# Patient Record
Sex: Male | Born: 1970
Health system: Southern US, Community
[De-identification: ages and names within clinical notes are randomized; demographics above are authoritative.]

## PROBLEM LIST (undated history)

## (undated) DIAGNOSIS — T7840XA Allergy, unspecified, initial encounter: Secondary | ICD-10-CM

## (undated) DIAGNOSIS — J45909 Unspecified asthma, uncomplicated: Secondary | ICD-10-CM

## (undated) DIAGNOSIS — R0789 Other chest pain: Secondary | ICD-10-CM

## (undated) DIAGNOSIS — I639 Cerebral infarction, unspecified: Secondary | ICD-10-CM

## (undated) DIAGNOSIS — G8929 Other chronic pain: Secondary | ICD-10-CM

## (undated) DIAGNOSIS — R0600 Dyspnea, unspecified: Secondary | ICD-10-CM

## (undated) DIAGNOSIS — G43909 Migraine, unspecified, not intractable, without status migrainosus: Secondary | ICD-10-CM

## (undated) DIAGNOSIS — G4700 Insomnia, unspecified: Secondary | ICD-10-CM

## (undated) DIAGNOSIS — I480 Paroxysmal atrial fibrillation: Secondary | ICD-10-CM

## (undated) DIAGNOSIS — F329 Major depressive disorder, single episode, unspecified: Secondary | ICD-10-CM

## (undated) DIAGNOSIS — M1990 Unspecified osteoarthritis, unspecified site: Secondary | ICD-10-CM

## (undated) DIAGNOSIS — F319 Bipolar disorder, unspecified: Secondary | ICD-10-CM

## (undated) DIAGNOSIS — F419 Anxiety disorder, unspecified: Secondary | ICD-10-CM

## (undated) DIAGNOSIS — Z8719 Personal history of other diseases of the digestive system: Secondary | ICD-10-CM

## (undated) DIAGNOSIS — R51 Headache: Secondary | ICD-10-CM

## (undated) DIAGNOSIS — M545 Low back pain, unspecified: Secondary | ICD-10-CM

## (undated) DIAGNOSIS — F32A Depression, unspecified: Secondary | ICD-10-CM

## (undated) DIAGNOSIS — K219 Gastro-esophageal reflux disease without esophagitis: Secondary | ICD-10-CM

## (undated) DIAGNOSIS — F5104 Psychophysiologic insomnia: Secondary | ICD-10-CM

## (undated) DIAGNOSIS — F1010 Alcohol abuse, uncomplicated: Secondary | ICD-10-CM

## (undated) DIAGNOSIS — G4730 Sleep apnea, unspecified: Secondary | ICD-10-CM

## (undated) DIAGNOSIS — I10 Essential (primary) hypertension: Secondary | ICD-10-CM

## (undated) DIAGNOSIS — Z8711 Personal history of peptic ulcer disease: Secondary | ICD-10-CM

## (undated) DIAGNOSIS — I499 Cardiac arrhythmia, unspecified: Secondary | ICD-10-CM

## (undated) DIAGNOSIS — F4310 Post-traumatic stress disorder, unspecified: Secondary | ICD-10-CM

## (undated) DIAGNOSIS — E785 Hyperlipidemia, unspecified: Secondary | ICD-10-CM

## (undated) HISTORY — DX: Other chest pain: R07.89

## (undated) HISTORY — DX: Sleep apnea, unspecified: G47.30

## (undated) HISTORY — PX: SHOULDER SURGERY: SHX246

## (undated) HISTORY — DX: Essential (primary) hypertension: I10

## (undated) HISTORY — DX: Gastro-esophageal reflux disease without esophagitis: K21.9

## (undated) HISTORY — DX: Hyperlipidemia, unspecified: E78.5

## (undated) HISTORY — DX: Allergy, unspecified, initial encounter: T78.40XA

## (undated) HISTORY — PX: EAR CANALOPLASTY: SHX1481

## (undated) HISTORY — DX: Cerebral infarction, unspecified: I63.9

---

## 1999-08-28 ENCOUNTER — Emergency Department (HOSPITAL_COMMUNITY): Admission: EM | Admit: 1999-08-28 | Discharge: 1999-08-29 | Payer: Self-pay | Admitting: Internal Medicine

## 1999-09-02 ENCOUNTER — Ambulatory Visit (HOSPITAL_COMMUNITY): Admission: RE | Admit: 1999-09-02 | Discharge: 1999-09-02 | Payer: Self-pay

## 1999-09-04 ENCOUNTER — Ambulatory Visit (HOSPITAL_COMMUNITY): Admission: RE | Admit: 1999-09-04 | Discharge: 1999-09-04 | Payer: Self-pay

## 1999-09-11 ENCOUNTER — Ambulatory Visit (HOSPITAL_COMMUNITY): Admission: RE | Admit: 1999-09-11 | Discharge: 1999-09-11 | Payer: Self-pay | Admitting: Emergency Medicine

## 1999-09-24 ENCOUNTER — Ambulatory Visit (HOSPITAL_COMMUNITY): Admission: RE | Admit: 1999-09-24 | Discharge: 1999-09-24 | Payer: Self-pay | Admitting: Internal Medicine

## 2003-02-11 ENCOUNTER — Emergency Department (HOSPITAL_COMMUNITY): Admission: EM | Admit: 2003-02-11 | Discharge: 2003-02-11 | Payer: Self-pay | Admitting: Emergency Medicine

## 2005-01-15 ENCOUNTER — Emergency Department (HOSPITAL_COMMUNITY): Admission: EM | Admit: 2005-01-15 | Discharge: 2005-01-15 | Payer: Self-pay | Admitting: Emergency Medicine

## 2005-11-17 ENCOUNTER — Emergency Department (HOSPITAL_COMMUNITY): Admission: EM | Admit: 2005-11-17 | Discharge: 2005-11-17 | Payer: Self-pay | Admitting: Emergency Medicine

## 2006-06-24 ENCOUNTER — Emergency Department (HOSPITAL_COMMUNITY): Admission: EM | Admit: 2006-06-24 | Discharge: 2006-06-24 | Payer: Self-pay | Admitting: Emergency Medicine

## 2006-08-15 ENCOUNTER — Emergency Department (HOSPITAL_COMMUNITY): Admission: EM | Admit: 2006-08-15 | Discharge: 2006-08-15 | Payer: Self-pay | Admitting: Emergency Medicine

## 2006-08-15 ENCOUNTER — Ambulatory Visit: Payer: Self-pay | Admitting: Vascular Surgery

## 2006-08-15 ENCOUNTER — Encounter (INDEPENDENT_AMBULATORY_CARE_PROVIDER_SITE_OTHER): Payer: Self-pay | Admitting: Emergency Medicine

## 2006-08-16 ENCOUNTER — Emergency Department (HOSPITAL_COMMUNITY): Admission: EM | Admit: 2006-08-16 | Discharge: 2006-08-17 | Payer: Self-pay | Admitting: Emergency Medicine

## 2007-06-02 ENCOUNTER — Emergency Department (HOSPITAL_COMMUNITY): Admission: EM | Admit: 2007-06-02 | Discharge: 2007-06-02 | Payer: Self-pay | Admitting: Emergency Medicine

## 2008-02-22 ENCOUNTER — Ambulatory Visit: Payer: Self-pay | Admitting: Cardiovascular Disease

## 2008-02-22 ENCOUNTER — Observation Stay (HOSPITAL_COMMUNITY): Admission: EM | Admit: 2008-02-22 | Discharge: 2008-02-24 | Payer: Self-pay | Admitting: Emergency Medicine

## 2008-02-23 ENCOUNTER — Encounter (INDEPENDENT_AMBULATORY_CARE_PROVIDER_SITE_OTHER): Payer: Self-pay | Admitting: Internal Medicine

## 2008-11-25 ENCOUNTER — Emergency Department (HOSPITAL_COMMUNITY): Admission: EM | Admit: 2008-11-25 | Discharge: 2008-11-25 | Payer: Self-pay | Admitting: Emergency Medicine

## 2009-01-03 ENCOUNTER — Emergency Department (HOSPITAL_COMMUNITY): Admission: EM | Admit: 2009-01-03 | Discharge: 2009-01-03 | Payer: Self-pay | Admitting: Emergency Medicine

## 2009-04-18 ENCOUNTER — Emergency Department (HOSPITAL_COMMUNITY): Admission: EM | Admit: 2009-04-18 | Discharge: 2009-04-19 | Payer: Self-pay | Admitting: Emergency Medicine

## 2010-05-22 LAB — URINALYSIS, ROUTINE W REFLEX MICROSCOPIC
Bilirubin Urine: NEGATIVE
Glucose, UA: NEGATIVE mg/dL
Hgb urine dipstick: NEGATIVE
Ketones, ur: NEGATIVE mg/dL
Nitrite: NEGATIVE
Protein, ur: NEGATIVE mg/dL
Specific Gravity, Urine: 1.019 (ref 1.005–1.030)
Urobilinogen, UA: 0.2 mg/dL (ref 0.0–1.0)
pH: 6.5 (ref 5.0–8.0)

## 2010-05-22 LAB — URINE MICROSCOPIC-ADD ON

## 2010-05-22 LAB — POCT I-STAT, CHEM 8
BUN: 9 mg/dL (ref 6–23)
Calcium, Ion: 1.23 mmol/L (ref 1.12–1.32)
Chloride: 103 mEq/L (ref 96–112)
Creatinine, Ser: 0.9 mg/dL (ref 0.4–1.5)
Glucose, Bld: 102 mg/dL — ABNORMAL HIGH (ref 70–99)
HCT: 45 % (ref 39.0–52.0)
Hemoglobin: 15.3 g/dL (ref 13.0–17.0)
Potassium: 4 mEq/L (ref 3.5–5.1)
Sodium: 141 mEq/L (ref 135–145)
TCO2: 24 mmol/L (ref 0–100)

## 2010-05-22 LAB — POCT CARDIAC MARKERS
CKMB, poc: <1 ng/mL — ABNORMAL LOW (ref 1.0–8.0)
Myoglobin, poc: 53 ng/mL (ref 12–200)
Troponin i, poc: <0.05 ng/mL (ref 0.00–0.09)

## 2010-06-03 LAB — COMPREHENSIVE METABOLIC PANEL
ALT: 29 U/L (ref 0–53)
AST: 17 U/L (ref 0–37)
Albumin: 4.1 g/dL (ref 3.5–5.2)
Alkaline Phosphatase: 71 U/L (ref 39–117)
BUN: 6 mg/dL (ref 6–23)
CO2: 27 mEq/L (ref 19–32)
Calcium: 9.2 mg/dL (ref 8.4–10.5)
Chloride: 106 mEq/L (ref 96–112)
Creatinine, Ser: 0.75 mg/dL (ref 0.4–1.5)
GFR calc Af Amer: >60 mL/min (ref >60)
GFR calc non Af Amer: >60 mL/min (ref >60)
Glucose, Bld: 96 mg/dL (ref 70–99)
Potassium: 3.8 mEq/L (ref 3.5–5.1)
Sodium: 139 mEq/L (ref 135–145)
Total Bilirubin: 0.9 mg/dL (ref 0.3–1.2)
Total Protein: 7.5 g/dL (ref 6.0–8.3)

## 2010-06-03 LAB — LIPID PANEL
Cholesterol: 146 mg/dL (ref 0–200)
HDL: 36 mg/dL — ABNORMAL LOW (ref >39)
LDL Cholesterol: 90 mg/dL (ref 0–99)
Total CHOL/HDL Ratio: 4.1 RATIO
Triglycerides: 98 mg/dL (ref <150)
VLDL: 20 mg/dL (ref 0–40)

## 2010-06-03 LAB — CARDIAC PANEL(CRET KIN+CKTOT+MB+TROPI)
CK, MB: 1 ng/mL (ref 0.3–4.0)
CK, MB: 1 ng/mL (ref 0.3–4.0)
CK, MB: 1.2 ng/mL (ref 0.3–4.0)
Relative Index: INVALID (ref 0.0–2.5)
Relative Index: INVALID (ref 0.0–2.5)
Relative Index: INVALID (ref 0.0–2.5)
Total CK: 57 U/L (ref 7–232)
Total CK: 61 U/L (ref 7–232)
Total CK: 61 U/L (ref 7–232)
Troponin I: 0.01 ng/mL (ref 0.00–0.06)
Troponin I: <0.01 ng/mL (ref 0.00–0.06)
Troponin I: <0.01 ng/mL (ref 0.00–0.06)

## 2010-06-03 LAB — CK TOTAL AND CKMB (NOT AT ARMC)
CK, MB: 1.2 ng/mL (ref 0.3–4.0)
Relative Index: INVALID (ref 0.0–2.5)
Total CK: 71 U/L (ref 7–232)

## 2010-06-03 LAB — CBC
HCT: 44.9 % (ref 39.0–52.0)
Hemoglobin: 14.9 g/dL (ref 13.0–17.0)
MCHC: 33.2 g/dL (ref 30.0–36.0)
MCV: 93 fL (ref 78.0–100.0)
Platelets: 211 10*3/uL (ref 150–400)
RBC: 4.83 MIL/uL (ref 4.22–5.81)
RDW: 13.9 % (ref 11.5–15.5)
WBC: 6 10*3/uL (ref 4.0–10.5)

## 2010-06-03 LAB — DIFFERENTIAL
Basophils Absolute: 0 10*3/uL (ref 0.0–0.1)
Basophils Relative: 0 % (ref 0–1)
Eosinophils Absolute: 0 10*3/uL (ref 0.0–0.7)
Eosinophils Relative: 1 % (ref 0–5)
Lymphocytes Relative: 22 % (ref 12–46)
Lymphs Abs: 1.3 10*3/uL (ref 0.7–4.0)
Monocytes Absolute: 0.4 10*3/uL (ref 0.1–1.0)
Monocytes Relative: 7 % (ref 3–12)
Neutro Abs: 4.3 10*3/uL (ref 1.7–7.7)
Neutrophils Relative %: 71 % (ref 43–77)

## 2010-06-03 LAB — POCT CARDIAC MARKERS
CKMB, poc: <1 ng/mL — ABNORMAL LOW (ref 1.0–8.0)
Myoglobin, poc: 35.5 ng/mL (ref 12–200)
Troponin i, poc: <0.05 ng/mL (ref 0.00–0.09)

## 2010-06-03 LAB — TSH: TSH: 0.751 u[IU]/mL (ref 0.350–4.500)

## 2010-06-03 LAB — TROPONIN I: Troponin I: <0.01 ng/mL (ref 0.00–0.06)
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
## 2010-07-02 NOTE — Discharge Summary (Signed)
NAMEROTH, RESS                 ACCOUNT NO.:  [id number]   MEDICAL RECORD NO.:  [id number]          PATIENT TYPE:  OBV   LOCATION:  4738                         FACILITY:  [facility]   PHYSICIAN:  Michelene Gardener, MD    DATE OF BIRTH:  May 07, 1970   DATE OF ADMISSION:  02/22/2008  DATE OF DISCHARGE:  02/24/2008                               DISCHARGE SUMMARY   DISCHARGE DIAGNOSES:  1. Chest pain related to stress and anxiety.  2. Bipolar disorder, the patient is not on medications.  3. Suicide ideation.  4. Tobacco abuse.  5. Elevated blood pressure that normalized and it was secondary to      stress.   DISCHARGE MEDICATIONS:  None.   PROCEDURES:  None.   RADIOLOGIC STUDIES:  1. Chest x-ray in February 22, 2008 showed no active disease.  2. CT angiography in February 22, 2008 showed no pulmonary embolism and      no other acute problem.  3. Echocardiogram in February 23, 2008 showed ejection fraction of 60%      with no left wall motion abnormalities.   CONSULTATIONS:  Psych consult by Dr. Antonietta Breach.   PROCEDURES:  None.   FOLLOW UP:  With his primary doctor within 1-2 weeks.   COURSE OF HOSPITALIZATION:  This is a 40 year old African American male  with history of bipolar disorder who presented to the hospital on  February 22, 2008 with chest pain.  The patient was admitted to the  hospital for further evaluation.  Telemetry was monitored and showed no  evidence of acute problem.  Three sets of troponin and cardiac enzymes  were done and they came to be negative.  Echocardiogram was done and  showed normal ejection fraction without evidence of acute ischemia.  His  pain is felt to be secondary to distress.  At the time of discharge, his  pain resolved and the patient does not need further workup at this time.  Psych was consulted during the hospitalization because the patient has  been feeling depressed and he was thinking about suicide.  Initially, he  was monitored with  one-to-one sitter.  Dr. Jeanie Sewer evaluated him in  the hospital.  At that time, he was feeling better and he denied  suicidal ideations.  The sitter was discontinued.  He was given an  option for optional admission to  Behavior Medicine since he is not committable.  The patient chose to go  home and he will follow with Psych as an outpatient.  He will be  discharged today in satisfactory condition.   TOTAL ASSESSMENT TIME:  40 minutes.      Michelene Gardener, MD  Electronically Signed     NAE/MEDQ  D:  02/24/2008  T:  02/24/2008  Job:  [id number]
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
## 2010-12-04 LAB — CBC
HCT: 46.7
MCHC: 34.2
MCV: 90.2
RBC: 5.18

## 2010-12-04 LAB — POCT CARDIAC MARKERS
Operator id: 277751
Troponin i, poc: <0.05

## 2010-12-04 LAB — I-STAT 8, (EC8 V) (CONVERTED LAB)
Acid-base deficit: 2
BUN: 10
Chloride: 106
pCO2, Ven: 39.6 — ABNORMAL LOW
pH, Ven: 7.37 — ABNORMAL HIGH

## 2010-12-04 LAB — APTT: aPTT: 27

## 2010-12-04 LAB — D-DIMER, QUANTITATIVE: D-Dimer, Quant: <0.22

## 2010-12-04 LAB — POCT I-STAT CREATININE
Creatinine, Ser: 0.9
Operator id: 277751

## 2010-12-04 LAB — DIFFERENTIAL
Eosinophils Absolute: 0
Eosinophils Relative: 1
Lymphs Abs: 1.1
Monocytes Absolute: 0.5

## 2010-12-04 LAB — CK: Total CK: 67

## 2011-09-18 HISTORY — PX: KNEE ARTHROSCOPY: SHX127

## 2012-06-11 ENCOUNTER — Emergency Department (HOSPITAL_COMMUNITY): Payer: Self-pay

## 2012-06-11 ENCOUNTER — Other Ambulatory Visit: Payer: Self-pay

## 2012-06-11 ENCOUNTER — Observation Stay (HOSPITAL_COMMUNITY)
Admission: EM | Admit: 2012-06-11 | Discharge: 2012-06-12 | Disposition: A | Discharge status: Home / Self Care | Payer: 59 | Attending: Internal Medicine | Admitting: Internal Medicine

## 2012-06-11 ENCOUNTER — Encounter (HOSPITAL_COMMUNITY): Payer: Self-pay | Admitting: Emergency Medicine

## 2012-06-11 DIAGNOSIS — Z23 Encounter for immunization: Secondary | ICD-10-CM | POA: Insufficient documentation

## 2012-06-11 DIAGNOSIS — F1010 Alcohol abuse, uncomplicated: Secondary | ICD-10-CM

## 2012-06-11 DIAGNOSIS — R000 Tachycardia, unspecified: Secondary | ICD-10-CM | POA: Insufficient documentation

## 2012-06-11 DIAGNOSIS — I4891 Unspecified atrial fibrillation: Secondary | ICD-10-CM

## 2012-06-11 DIAGNOSIS — R079 Chest pain, unspecified: Secondary | ICD-10-CM | POA: Insufficient documentation

## 2012-06-11 DIAGNOSIS — I10 Essential (primary) hypertension: Secondary | ICD-10-CM

## 2012-06-11 DIAGNOSIS — R0602 Shortness of breath: Secondary | ICD-10-CM | POA: Insufficient documentation

## 2012-06-11 LAB — CBC WITH DIFFERENTIAL/PLATELET
Hemoglobin: 16.4 g/dL (ref 13.0–17.0)
Lymphs Abs: 3.1 10*3/uL (ref 0.7–4.0)
Monocytes Relative: 7 % (ref 3–12)
Neutro Abs: 4.8 10*3/uL (ref 1.7–7.7)
Neutrophils Relative %: 56 % (ref 43–77)
RBC: 5.21 MIL/uL (ref 4.22–5.81)

## 2012-06-11 LAB — POCT I-STAT, CHEM 8
Calcium, Ion: 1.15 mmol/L (ref 1.12–1.23)
Chloride: 110 mEq/L (ref 96–112)
Glucose, Bld: 106 mg/dL — ABNORMAL HIGH (ref 70–99)
HCT: 48 % (ref 39.0–52.0)
Hemoglobin: 16.3 g/dL (ref 13.0–17.0)

## 2012-06-11 LAB — RAPID URINE DRUG SCREEN, HOSP PERFORMED
Amphetamines: NOT DETECTED
Cocaine: NOT DETECTED
Opiates: NOT DETECTED
Tetrahydrocannabinol: NOT DETECTED

## 2012-06-11 MED ORDER — ASPIRIN 81 MG PO CHEW
324.0000 mg | CHEWABLE_TABLET | Freq: Once | ORAL | Status: AC
  Administered 2012-06-11: 324 mg via ORAL
  Filled 2012-06-11: qty 4

## 2012-06-11 MED ORDER — DILTIAZEM HCL 100 MG IV SOLR
5.0000 mg/h | Freq: Once | INTRAVENOUS | Status: AC
  Administered 2012-06-11: 5 mg/h via INTRAVENOUS

## 2012-06-11 MED ORDER — METOPROLOL SUCCINATE ER 50 MG PO TB24: 50.0000 mg | ORAL_TABLET | Freq: Two times a day (BID) | ORAL | Status: DC

## 2012-06-11 MED ORDER — RIVAROXABAN 20 MG PO TABS
20.0000 mg | ORAL_TABLET | Freq: Every day | ORAL | Status: DC
  Administered 2012-06-11: 20 mg via ORAL
  Filled 2012-06-11 (×2): qty 1

## 2012-06-11 MED ORDER — DEXTROSE 5 % IV SOLN
5.0000 mg/h | Freq: Once | INTRAVENOUS | Status: AC
  Administered 2012-06-11: 5 mg/h via INTRAVENOUS

## 2012-06-11 MED ORDER — RIVAROXABAN 20 MG PO TABS: 20.0000 mg | ORAL_TABLET | Freq: Every day | ORAL | Status: DC

## 2012-06-11 MED ORDER — FLECAINIDE ACETATE 100 MG PO TABS
300.0000 mg | ORAL_TABLET | Freq: Once | ORAL | Status: AC
  Administered 2012-06-11: 300 mg via ORAL
  Filled 2012-06-11: qty 3

## 2012-06-11 MED ORDER — METOPROLOL TARTRATE 1 MG/ML IV SOLN
5.0000 mg | Freq: Once | INTRAVENOUS | Status: AC
  Administered 2012-06-11: 5 mg via INTRAVENOUS
  Filled 2012-06-11: qty 5

## 2012-06-11 MED ORDER — DILTIAZEM HCL 25 MG/5ML IV SOLN
20.0000 mg | Freq: Once | INTRAVENOUS | Status: AC
  Administered 2012-06-11: 20 mg via INTRAVENOUS

## 2012-06-11 NOTE — ED Provider Notes (Signed)
History     CSN: [id number]  Arrival date & time 06/11/12  1348   First MD Initiated Contact with Patient 06/11/12 1403      No chief complaint on file.   (Consider location/radiation/quality/duration/timing/severity/associated sxs/prior treatment) HPI Comments: 42 year old male with a history of hypertension for which he takes metoprolol presents with acute onset of chest pain which started one hour prior to arrival while he was sitting on the couch, is an uncomfortable feeling in his chest which he has had intermittently in the past though it comes on less than once a year. Nothing seems to make this better or worse, the patient was found to be very tachycardic in triage in an irregularly irregular rhythm. He admits to drinking alcohol on the weekends and states that he will often drink a 12 pack over 2 days, had 4 beers last night and denies any illegal substance use or other prescription drug use. He is on a weight loss program and taking a supplement with an unknown substance in the but does not take any over-the-counter medications for sinuses, coughing nor does he have any history of thyroid abnormalities. Prior to the symptoms today he was having a normal day with no complaints.  The history is provided by the patient and medical records.    Past Medical History  Diagnosis Date  . Hypertension     Past Surgical History  Procedure Laterality Date  . Knee surgery      No family history on file.  History  Substance Use Topics  . Smoking status: Former Smoker    Quit date: 01/03/2012  . Smokeless tobacco: Not on file  . Alcohol Use: Yes     Comment: occasionally      Review of Systems  All other systems reviewed and are negative.    Allergies  Depakote  Home Medications   Current Outpatient Rx  Name  Route  Sig  Dispense  Refill  . metoprolol succinate (TOPROL-XL) 50 MG 24 hr tablet   Oral   Take 50 mg by mouth daily. Take with or immediately following a  meal.         . Multiple Vitamins-Minerals (MENS ONE DAILY PO)   Oral   Take 1 tablet by mouth daily.           BP 125/78  Pulse 66  Temp(Src) 98.1 F (36.7 C)  Resp 15  SpO2 99%  Physical Exam  Nursing note and vitals reviewed. Constitutional: He appears well-developed and well-nourished. No distress.  HENT:  Head: Normocephalic and atraumatic.  Mouth/Throat: Oropharynx is clear and moist. No oropharyngeal exudate.  Eyes: Conjunctivae and EOM are normal. Pupils are equal, round, and reactive to light. Right eye exhibits no discharge. Left eye exhibits no discharge. No scleral icterus.  Neck: Normal range of motion. Neck supple. No JVD present. No thyromegaly present.  Cardiovascular: Normal heart sounds and intact distal pulses.  Exam reveals no gallop and no friction rub.   No murmur heard. Strong pulses at the radial arteries bilaterally, irregularly irregular tachycardic rhythm  Pulmonary/Chest: Effort normal and breath sounds normal. No respiratory distress. He has no wheezes. He has no rales.  Abdominal: Soft. Bowel sounds are normal. He exhibits no distension and no mass. There is no tenderness.  Musculoskeletal: Normal range of motion. He exhibits no edema and no tenderness.  Lymphadenopathy:    He has no cervical adenopathy.  Neurological: He is alert. Coordination normal.  Skin: Skin is warm and dry.  No rash noted. No erythema.  Psychiatric: He has a normal mood and affect. His behavior is normal.    ED Course  Procedures (including critical care time)  Labs Reviewed  CBC WITH DIFFERENTIAL - Abnormal; Notable for the following:    MCHC 36.6 (*)    All other components within normal limits  POCT I-STAT, CHEM 8 - Abnormal; Notable for the following:    Glucose, Bld 106 (*)    All other components within normal limits  ETHANOL  URINE RAPID DRUG SCREEN (HOSP PERFORMED)  TSH  POCT I-STAT TROPONIN I   Dg Chest Port 1 View  06/11/2012  *RADIOLOGY REPORT*   Clinical Data: Shortness of breath and chest pain.  PORTABLE CHEST - 1 VIEW  Comparison: 04/18/2009 chest radiograph  Findings: The cardiomediastinal silhouette is unremarkable. There is no evidence of focal airspace disease, pulmonary edema, suspicious pulmonary nodule/mass, pleural effusion, or pneumothorax. No acute bony abnormalities are identified.  IMPRESSION: No evidence of acute cardiopulmonary disease.   Original Report Authenticated By: Harmon Pier, M.D.      1. Atrial fibrillation with rapid ventricular response       MDM  The patient is in atrial fibrillation with rapid ventricular rate at pulse of between 140 and 160 beats per minute. There is nonspecific T wave changes but no signs of acute ischemia. The patient will require an evaluation for his atrial fibrillation as well as rate control at this time. He is already on a beta blocker the cells start with IV Lopressor, if that does not help we will try IV Cardizem.  ED ECG REPORT  I personally interpreted this EKG   Date: 06/11/2012   Rate: 149  Rhythm: atrial fibrillationwith rapid ventricular response  QRS Axis: normal  Intervals: normal  ST/T Wave abnormalities: nonspecific T wave changes  Conduction Disutrbances:none  Narrative Interpretation:   Old EKG Reviewed: Compared with 01/03/2009, normal sinus rhythm has been replaced with atrial fibrillation  The patient's testing shows that he has a normal metabolic panel, no renal dysfunction, normal blood counts, normal troponin.  Portable chest x-ray reveals no signs of abnormalities.  The patient is requiring Cardizem bolus and drip to help control heart rate for the persistent tachycardia. Currently on a drip of 5 mg an hour the patient's heart rate is around 100 beats per minute, still in atrial fibrillation, no conversion to normal sinus rhythm.  I discussed the patient's care with the cardiology team, they will come to admit to the hospital,  Critical care delivered  secondary to significant atrial fibrillation with rapid ventricular response requiring continuous IV infusion of medications for rate control.  CRITICAL CARE Performed by: Vida Roller   Total critical care time: 35  Critical care time was exclusive of separately billable procedures and treating other patients.  Critical care was necessary to treat or prevent imminent or life-threatening deterioration.  Critical care was time spent personally by me on the following activities: development of treatment plan with patient and/or surrogate as well as nursing, discussions with consultants, evaluation of patient's response to treatment, examination of patient, obtaining history from patient or surrogate, ordering and performing treatments and interventions, ordering and review of laboratory studies, ordering and review of radiographic studies, pulse oximetry and re-evaluation of patient's condition.        Vida Roller, MD 06/11/12 1524

## 2012-06-11 NOTE — Progress Notes (Signed)
  Echocardiogram 2D Echocardiogram has been performed.  Scott Walter 06/11/2012, 7:35 PM

## 2012-06-11 NOTE — H&P (Addendum)
Patient ID: Scott Walter MRN: [id number], DOB/AGE: 42/07/1970   Admit date: 06/11/2012 Date of Consult: @TODAY @  Primary Physician: No PCP Per Patient Primary Cardiologist: New    Problem List: Past Medical History  Diagnosis Date  . Hypertension     Past Surgical History  Procedure Laterality Date  . Knee surgery      FHX  NEg for afib or CAD Allergies:  Allergies  Allergen Reactions  . Depakote (Divalproex Sodium)    MEDS:  Toprol XL 50 mg qd ALkilete (supplement:  Ca, Mg, K)  HPI:  Patient is a 42 yo with a history of HTN who presents to the ER with complaints of CP  Pain began while sitting on sofa.  Felt uncomfortable  Has had rarely in past.  Came to ER  Found to be in afib with rapid response. The patieth admits to drinking 6beers per day over weekend  Had 4 beers last nigh.  Is taking some diet supplement. Sitting on sofa  Felt chest heaviness  Discomfort in L arm  Tingling  Associated with SOB  Felt heart racing like running marathon   Rare flutters in past.  Rare  [facility]  On Toprol XL 50  He felt fine yesterday  Fine this AM until this started.  No SOB NO CP    No family history on file.   History   Social History  . Marital Status: Single    Spouse Name: N/A    Number of Children: N/A  . Years of Education: N/A   Occupational History  . Not on file.   Social History Main Topics  . Smoking status: Former Smoker    Quit date: 01/03/2012  . Smokeless tobacco: Not on file  . Alcohol Use: Yes     Comment: occasionally  . Drug Use: Not on file  . Sexually Active: Not on file   Other Topics Concern  . Not on file   Social History Narrative  . No narrative on file     Review of Systems: All other systems reviewed and are otherwise negative except as noted above.  Physical Exam: Filed Vitals:   06/11/12 1656  BP: 114/74  Pulse: 97  Temp:   Resp: 14   No intake or output data in the 24 hours ending 06/11/12  1740  General: Well developed, well nourished, in no acute distress. Head: Normocephalic, atraumatic, sclera non-icteric Neck: Negative for carotid bruits. JVP not elevated. Lungs: Clear bilaterally to auscultation without wheezes, rales, or rhonchi. Breathing is unlabored. Heart: RRR with S1 S2. No murmurs, rubs, or gallops appreciated. Abdomen: Soft, non-tender, non-distended with normoactive bowel sounds. No hepatomegaly. No rebound/guarding. No obvious abdominal masses. Msk:  Strength and tone appears normal for age. Extremities: No clubbing, cyanosis or edema.  Distal pedal pulses are 2+ and equal bilaterally. Neuro: Alert and oriented X 3. Moves all extremities spontaneously. Psych:  Responds to questions appropriately with a normal affect.  Labs: Results for orders placed during the hospital encounter of 06/11/12 (from the past 24 hour(s))  CBC WITH DIFFERENTIAL     Status: Abnormal   Collection Time    06/11/12  2:18 PM      Result Value Range   WBC 8.7  4.0 - 10.5 K/uL   RBC 5.21  4.22 - 5.81 MIL/uL   Hemoglobin 16.4  13.0 - 17.0 g/dL   HCT 40.9  81.1 - 91.4 %   MCV 86.0  78.0 -  100.0 fL   MCH 31.5  26.0 - 34.0 pg   MCHC 36.6 (*) 30.0 - 36.0 g/dL   RDW 47.8  29.5 - 62.1 %   Platelets 217  150 - 400 K/uL   Neutrophils Relative 56  43 - 77 %   Neutro Abs 4.8  1.7 - 7.7 K/uL   Lymphocytes Relative 36  12 - 46 %   Lymphs Abs 3.1  0.7 - 4.0 K/uL   Monocytes Relative 7  3 - 12 %   Monocytes Absolute 0.6  0.1 - 1.0 K/uL   Eosinophils Relative 2  0 - 5 %   Eosinophils Absolute 0.1  0.0 - 0.7 K/uL   Basophils Relative 1  0 - 1 %   Basophils Absolute 0.0  0.0 - 0.1 K/uL  ETHANOL     Status: None   Collection Time    06/11/12  2:18 PM      Result Value Range   Alcohol, Ethyl (B) <11  0 - 11 mg/dL  POCT I-STAT TROPONIN I     Status: None   Collection Time    06/11/12  2:43 PM      Result Value Range   Troponin i, poc 0.00  0.00 - 0.08 ng/mL   Comment 3           POCT  I-STAT, CHEM 8     Status: Abnormal   Collection Time    06/11/12  2:44 PM      Result Value Range   Sodium 143  135 - 145 mEq/L   Potassium 4.1  3.5 - 5.1 mEq/L   Chloride 110  96 - 112 mEq/L   BUN 14  6 - 23 mg/dL   Creatinine, Ser 3.08  0.50 - 1.35 mg/dL   Glucose, Bld 657 (*) 70 - 99 mg/dL   Calcium, Ion 8.46  9.62 - 1.23 mmol/L   TCO2 22  0 - 100 mmol/L   Hemoglobin 16.3  13.0 - 17.0 g/dL   HCT 95.2  84.1 - 32.4 %    Radiology/Studies: Dg Chest Port 1 View  06/11/2012  *RADIOLOGY REPORT*  Clinical Data: Shortness of breath and chest pain.  PORTABLE CHEST - 1 VIEW  Comparison: 04/18/2009 chest radiograph  Findings: The cardiomediastinal silhouette is unremarkable. There is no evidence of focal airspace disease, pulmonary edema, suspicious pulmonary nodule/mass, pleural effusion, or pneumothorax. No acute bony abnormalities are identified.  IMPRESSION: No evidence of acute cardiopulmonary disease.   Original Report Authenticated By: Harmon Pier, M.D.     EKG:  Atrial fibrillation 149 bpm.  Nonspecific ST T wave changes.  ASSESSMENT AND PLAN:   Patient is a 42 yo who presents with chest pressure, palpitations, SOB Found to be in atrial fib with RVR Symptoms improved now that rate is slowed.   No CHF on exam Will get echo to evaluate LV   If normal LV function plan to cardiovert with 300 mg Flecanide.  Observe for 2 additonal hours after cardioversion. WIll begin Xarelto.   Check TSH  2.  HTN  WIll need to follow  If converts would continue toprol XL  Increase to BID  3.  EtOH.  Counselled on cutting back.     Signed, Dietrich Pates 06/11/2012, 5:40 PM  Addendum:  Echo shows normal LV and RV systolic function No valvular abnormalities Will use flecanide 300 mg IV x 1 and attempt cardioversion xarelto 20 mg started.  If converts observe for 2 hours after  conversion and d/c home with outpatient f/u in clinci I would recomm increasing Toprol XL 50 to bid  If remains in afib  would admit and get rate control overnight  Switch to po meds.

## 2012-06-11 NOTE — ED Notes (Signed)
Spoke with Cardiologist. Pt. Has not converted from A-fib. She will put Pt. In for admission.

## 2012-06-11 NOTE — ED Notes (Signed)
Cp/ sob started about 1 hour ago

## 2012-06-11 NOTE — Progress Notes (Signed)
Patient did not cardiovert with flecanide Will admit and switch to PO meds fro rate control Follow symptoms/ HR.  Consider d/c cardioversion on weekend or as outpatient. Continue Xarelto

## 2012-06-12 DIAGNOSIS — I10 Essential (primary) hypertension: Secondary | ICD-10-CM

## 2012-06-12 DIAGNOSIS — F1010 Alcohol abuse, uncomplicated: Secondary | ICD-10-CM

## 2012-06-12 LAB — BASIC METABOLIC PANEL
CO2: 26 mEq/L (ref 19–32)
Calcium: 8.9 mg/dL (ref 8.4–10.5)
Creatinine, Ser: 0.94 mg/dL (ref 0.50–1.35)
Glucose, Bld: 105 mg/dL — ABNORMAL HIGH (ref 70–99)
Potassium: 4.3 mEq/L (ref 3.5–5.1)
Sodium: 141 mEq/L (ref 135–145)

## 2012-06-12 LAB — LIPID PANEL
LDL Cholesterol: 85 mg/dL (ref 0–99)
Triglycerides: 193 mg/dL — ABNORMAL HIGH (ref <150)
VLDL: 39 mg/dL (ref 0–40)

## 2012-06-12 LAB — TROPONIN I: Troponin I: <0.3 ng/mL (ref <0.30)

## 2012-06-12 MED ORDER — ASPIRIN EC 81 MG PO TBEC: 81.0000 mg | DELAYED_RELEASE_TABLET | Freq: Every day | ORAL | Status: DC

## 2012-06-12 MED ORDER — ASPIRIN 300 MG RE SUPP
300.0000 mg | RECTAL | Status: AC
  Filled 2012-06-12: qty 1

## 2012-06-12 MED ORDER — PNEUMOCOCCAL VAC POLYVALENT 25 MCG/0.5ML IJ INJ
0.5000 mL | INJECTION | INTRAMUSCULAR | Status: AC
  Administered 2012-06-12: 0.5 mL via INTRAMUSCULAR
  Filled 2012-06-12: qty 0.5

## 2012-06-12 MED ORDER — RIVAROXABAN 20 MG PO TABS
20.0000 mg | ORAL_TABLET | Freq: Every day | ORAL | Status: DC
  Filled 2012-06-12: qty 1

## 2012-06-12 MED ORDER — METOPROLOL SUCCINATE ER 50 MG PO TB24
50.0000 mg | ORAL_TABLET | Freq: Two times a day (BID) | ORAL | Status: DC
  Administered 2012-06-12 (×2): 50 mg via ORAL
  Filled 2012-06-12 (×4): qty 1

## 2012-06-12 MED ORDER — ONDANSETRON HCL 4 MG/2ML IJ SOLN: 4.0000 mg | Freq: Four times a day (QID) | INTRAMUSCULAR | Status: DC | PRN

## 2012-06-12 MED ORDER — ASPIRIN 81 MG PO CHEW
324.0000 mg | CHEWABLE_TABLET | ORAL | Status: AC
  Administered 2012-06-12: 243 mg via ORAL
  Filled 2012-06-12: qty 3

## 2012-06-12 MED ORDER — ACETAMINOPHEN 325 MG PO TABS: 650.0000 mg | ORAL_TABLET | ORAL | Status: DC | PRN

## 2012-06-12 MED ORDER — RIVAROXABAN 20 MG PO TABS: 20.0000 mg | ORAL_TABLET | Freq: Every day | ORAL | Status: DC

## 2012-06-12 MED ORDER — PNEUMOCOCCAL VAC POLYVALENT 25 MCG/0.5ML IJ INJ: 0.5000 mL | INJECTION | INTRAMUSCULAR | Status: DC

## 2012-06-12 MED ORDER — DILTIAZEM HCL 100 MG IV SOLR
5.0000 mg/h | INTRAVENOUS | Status: DC
  Filled 2012-06-12: qty 100

## 2012-06-12 MED ORDER — NITROGLYCERIN 0.4 MG SL SUBL: 0.4000 mg | SUBLINGUAL_TABLET | SUBLINGUAL | Status: DC | PRN

## 2012-06-12 NOTE — Discharge Summary (Signed)
Discharge Summary   Patient ID: Scott Walter,  MRN: [id number], DOB/AGE: 1970/10/20 42 y.o.  Admit date: 06/11/2012 Discharge date: 06/12/2012  Primary Physician: No PCP Per Patient Primary Cardiologist: T. Saranya Harlin, MD  Discharge Diagnoses Principal Problem:   Atrial fibrillation with rapid ventricular response Active Problems:   ETOH abuse   HTN (hypertension)   Allergies Allergies  Allergen Reactions  . Depakote (Divalproex Sodium)     Diagnostic Studies/Procedures  PORTABLE CHEST X-RAY - 06/11/12  IMPRESSION:  No evidence of acute cardiopulmonary disease.   TRANSTHORACIC ECHOCARDIOGRAM - 06/11/12  The cavity size was normal. Abygail Galeno thickness was increased in a pattern of mild LVH. Systolic function was normal. The estimated ejection fraction was in the range of 60% to 65%. Transthoracic echocardiography. M-mode, complete 2D, spectral Doppler, and color Doppler. Blood pressure: 121/92. Patient status: Observation. Location: Emergency department.   History of Present Illness Scott Walter is a 42 y.o. male who was admitted to [facility] on 06/11/12 with the above problem list.   He has a prior history of hypertension. He presented to [facility] [place] yesterday complaining of chest pain in general comfortable feeling while sitting on the sofa. He described this as chest heaviness radiating to his left arm with associated tingling and shortness of breath. He he did endorse tachypalpitations. He reported drinking approximately 6 beers per day over the weekend. He didn't endorse the use of a dietary supplement as well.  In the ED, he was noted to be in atrial fibrillation with RVR, rate approximately 140 beats per minute. EKG and telemetry confirm this. Initial troponin I returned within normal limits. BMET and CBC were within normal limits. Chest x-ray as above revealed no evidence of acute cardiopulmonary disease. He was started on diltiazem drip with rate improvement. The patient  was observed overnight for further evaluation and management.  Hospital Course   He was started on Xarelto for anticoagulation. UDS unremarkable. TSH returned within normal limits. Lipid panel- LDL 85, HDL 41, TG 193, TC 165. A subsequent troponin I returned within normal limits. 2-D echocardiogram as above indicated mild LVH, EF 60-65% and was otherwise normal. He remained stable overnight and spontaneously converted to NSR this morning. He was evaluated by Dr. Daleen Squibb and felt to be stable for discharge. His outpatient Toprol XL regimen will be increased to twice a day dosing. Given CHADSVASc score of 1, and poor candidacy for long term anticoagulation due to EtOH abuse, Xarelto will not be continued long term. He has been advised to quit drinking alcohol. This information, including supplemental atrial fibrillation material, has been clearly outlined in the discharge AVS.   Discharge Vitals:  Blood pressure 120/70, pulse 75, temperature 98 F (36.7 C), resp. rate 18, height 5\' 9"  (1.753 m), weight 102.558 kg (226 lb 1.6 oz), SpO2 97.00%.   Labs: Recent Labs     06/11/12  1418  06/11/12  1444  WBC  8.7   --   HGB  16.4  16.3  HCT  44.8  48.0  MCV  86.0   --   PLT  217   --     Recent Labs Lab 06/11/12 1444 06/12/12 0455  NA 143 141  K 4.1 4.3  CL 110 107  CO2  --  26  BUN 14 15  CREATININE 0.90 0.94  CALCIUM  --  8.9  GLUCOSE 106* 105*   Recent Labs     06/12/12  0455  TROPONINI  <0.30   Recent  Labs     06/12/12  0455  CHOL  165  HDL  41  LDLCALC  85  TRIG  193*  CHOLHDL  4.0    Recent Labs  06/11/12 1547  TSH 0.420    Disposition:  Discharge Orders   Future Orders Complete By Expires     Diet - low sodium heart healthy  As directed     Increase activity slowly  As directed           Follow-up Information   Follow up with [facility]. (Office will call you with an appointment date & time. )    Contact information:   [address] [place] [address]       Discharge Medications:    Medication List    TAKE these medications       MENS ONE DAILY PO  Take 1 tablet by mouth daily.     metoprolol succinate 50 MG 24 hr tablet  Commonly known as:  TOPROL-XL  Take 1 tablet (50 mg total) by mouth 2 (two) times daily. Take with or immediately following a meal.     Rivaroxaban 20 MG Tabs  Commonly known as:  XARELTO  Take 1 tablet (20 mg total) by mouth daily with supper.       Outstanding Labs/Studies: None  Duration of Discharge Encounter: Greater than 30 minutes including physician time.  Signed, R. Hurman Horn, PA-C 06/12/2012, 12:25 PM    Jesse Sans. Daleen Squibb, MD, [facility] Pager:  [phone]

## 2012-06-12 NOTE — Progress Notes (Addendum)
   CARE MANAGEMENT NOTE 06/12/2012  Patient:  Scott Walter, Scott Walter   Account Number:  [id number]  Date Initiated:  06/12/2012  Documentation initiated by:  Magnus Ivan  Subjective/Objective Assessment:   Atrial fibrillation with rapid ventricular response.     Action/Plan:   Medication Assistance   Anticipated DC Date:  06/12/2012   Anticipated DC Plan:        DC Planning Services  CM consult  Medication Assistance      Choice offered to / List presented to:             Status of service:  Completed, signed off Medicare Important Message given?   (If response is "NO", the following Medicare IM given date fields will be blank) Date Medicare IM given:   Date Additional Medicare IM given:    Discharge Disposition:  HOME/SELF CARE  Per UR Regulation:    If discussed at Long Length of Stay Meetings, dates discussed:    Comments:  06/12/12 12:35pm Spouse went to [facility] and picked up 5 tablets of Xarelto for 63.00.  Patient will only have to take 5 tablets. Spoke to [employer] and they will follow-up with patient in office. Patient was signed up for Xarelto 10 day free trial card. Patient can call Xarelto patient assistance and receive information on 10 day free card. Magnus Ivan, RN, BSN Case Mgmt. Phone [phone]

## 2012-06-12 NOTE — Progress Notes (Signed)
Pt converted to NSR. EKG to confirm. MD made aware. Will continue to monitor.

## 2012-06-12 NOTE — Progress Notes (Addendum)
Patient ID: Scott Walter, male   DOB: [birth year], 42 y.o.   MRN: [id number]   Patient Name: Scott Walter Date of Encounter: 06/12/2012    SUBJECTIVE  Spontaneously converted to normal sinus rhythm. Feels back to baseline and wants to go home. Alcohol major contributor to his A. fib. Discussed at length.  CURRENT MEDS . [START ON 06/13/2012] aspirin EC  81 mg Oral Daily  . metoprolol succinate  50 mg Oral BID  . [START ON 06/13/2012] pneumococcal 23 valent vaccine  0.5 mL Intramuscular Tomorrow-1000  . rivaroxaban  20 mg Oral Q supper    OBJECTIVE  Filed Vitals:   06/11/12 2315 06/12/12 0007 06/12/12 0500 06/12/12 0850  BP: 128/80 142/88 115/78 120/70  Pulse: 105 87 70 75  Temp:  98.3 F (36.8 C) 98 F (36.7 C)   Resp: 18 18 18    Height: 5\' 9"  (1.753 m)     Weight: 226 lb 1.6 oz (102.558 kg)     SpO2: 99% 97% 97%     Intake/Output Summary (Last 24 hours) at 06/12/12 1020 Last data filed at 06/12/12 0900  Gross per 24 hour  Intake    360 ml  Output      0 ml  Net    360 ml   Filed Weights   06/11/12 2315  Weight: 226 lb 1.6 oz (102.558 kg)    PHYSICAL EXAM  General: Pleasant, NAD. Neuro: Alert and oriented X 3. Moves all extremities spontaneously. Psych: Normal affect. HEENT:  Normal  Neck: Supple without bruits or JVD. Lungs:  Resp regular and unlabored, CTA. Heart: RRR no s3, s4, or murmurs. Abdomen: Soft, non-tender, non-distended, BS + x 4.  Extremities: No clubbing, cyanosis or edema. DP/PT/Radials 2+ and equal bilaterally.  Accessory Clinical Findings  CBC  Recent Labs  06/11/12 1418 06/11/12 1444  WBC 8.7  --   NEUTROABS 4.8  --   HGB 16.4 16.3  HCT 44.8 48.0  MCV 86.0  --   PLT 217  --    Basic Metabolic Panel  Recent Labs  06/11/12 1444 06/12/12 0455  NA 143 141  K 4.1 4.3  CL 110 107  CO2  --  26  GLUCOSE 106* 105*  BUN 14 15  CREATININE 0.90 0.94  CALCIUM  --  8.9   Liver Function Tests No results found for this basename:  AST, ALT, ALKPHOS, BILITOT, PROT, ALBUMIN,  in the last 72 hours No results found for this basename: LIPASE, AMYLASE,  in the last 72 hours Cardiac Enzymes  Recent Labs  06/12/12 0455  TROPONINI <0.30   BNP No components found with this basename: POCBNP,  D-Dimer No results found for this basename: DDIMER,  in the last 72 hours Hemoglobin A1C No results found for this basename: HGBA1C,  in the last 72 hours Fasting Lipid Panel  Recent Labs  06/12/12 0455  CHOL 165  HDL 41  LDLCALC 85  TRIG 193*  CHOLHDL 4.0   Thyroid Function Tests  Recent Labs  06/11/12 1547  TSH 0.420    TELE  Normal sinus rhythm  ECG    Radiology/Studies  Dg Chest Port 1 View  06/11/2012  *RADIOLOGY REPORT*  Clinical Data: Shortness of breath and chest pain.  PORTABLE CHEST - 1 VIEW  Comparison: 04/18/2009 chest radiograph  Findings: The cardiomediastinal silhouette is unremarkable. There is no evidence of focal airspace disease, pulmonary edema, suspicious pulmonary nodule/mass, pleural effusion, or pneumothorax. No acute bony abnormalities are identified.  IMPRESSION:  No evidence of acute cardiopulmonary disease.   Original Report Authenticated By: Harmon Pier, M.D.     ASSESSMENT AND PLAN   Alcohol-induced paroxysmal A. fib. We'll discharge today on metoprolol extended release 50 mg twice a day. His wife is artery obtained his meds including Xarelto. I've advised him to continue that for 6 days which is a number of tablets she got. We'll schedule for followup in the office with Dr. Tenny Craw. Patient advised not to come to the ED unless very symptomatic. He will also discontinue or seriously reduce his alcohol consumption. I would not prescribe aspirin.  Signed, Valera Castle MD

## 2012-06-12 NOTE — Progress Notes (Signed)
Utilization review completed.  P.J. Amilio Zehnder,RN,BSN Case Manager [phone]  

## 2012-06-15 ENCOUNTER — Telehealth: Payer: Self-pay | Admitting: Internal Medicine

## 2012-06-15 NOTE — Telephone Encounter (Signed)
New problem    Per after hour voice mail  Call patient to make an appt f/u his discharged from the hosptial    1. Left message on home voice mail to contact office.

## 2012-07-02 ENCOUNTER — Encounter: Payer: Self-pay | Admitting: Internal Medicine

## 2012-07-02 ENCOUNTER — Ambulatory Visit (INDEPENDENT_AMBULATORY_CARE_PROVIDER_SITE_OTHER): Payer: Self-pay | Admitting: Internal Medicine

## 2012-07-02 VITALS — BP 118/83 | HR 64 | Ht 69.0 in | Wt 232.8 lb

## 2012-07-02 DIAGNOSIS — I10 Essential (primary) hypertension: Secondary | ICD-10-CM

## 2012-07-02 MED ORDER — METOPROLOL SUCCINATE ER 50 MG PO TB24: 50.0000 mg | ORAL_TABLET | Freq: Two times a day (BID) | ORAL | Status: DC

## 2012-07-02 MED ORDER — ASPIRIN EC 81 MG PO TBEC: 81.0000 mg | DELAYED_RELEASE_TABLET | Freq: Every day | ORAL | Status: DC

## 2012-07-02 NOTE — Progress Notes (Signed)
HPI Patient is a 42 yo who was admitted to [facility] a few wks ago.  He presented with afib with RVR.  He was given IV diltiazem and po flecanide.  He converted hours later to SR.  Echo showed normal LV function. Since d/c he has been feeling ok  A little sluggish but not dizzy. No palpitations.  No SOB  No CP Allergies  Allergen Reactions  . Depakote (Divalproex Sodium)     Current Outpatient Prescriptions  Medication Sig Dispense Refill  . metoprolol succinate (TOPROL-XL) 50 MG 24 hr tablet Take 1 tablet (50 mg total) by mouth 2 (two) times daily. Take with or immediately following a meal.  60 tablet  11  . Multiple Vitamins-Minerals (MENS ONE DAILY PO) Take 1 tablet by mouth daily.       No current facility-administered medications for this visit.    Past Medical History  Diagnosis Date  . Hypertension     Past Surgical History  Procedure Laterality Date  . Knee surgery      No family history on file.  History   Social History  . Marital Status: Single    Spouse Name: N/A    Number of Children: N/A  . Years of Education: N/A   Occupational History  . Not on file.   Social History Main Topics  . Smoking status: Former Smoker    Quit date: 01/03/2012  . Smokeless tobacco: Not on file  . Alcohol Use: Yes     Comment: occasionally  . Drug Use: Not on file  . Sexually Active: Not on file   Other Topics Concern  . Not on file   Social History Narrative  . No narrative on file    Review of Systems:  All systems reviewed.  They are negative to the above problem except as previously stated.  Vital Signs: BP 118/83  Pulse 64  Ht 5\' 9"  (1.753 m)  Wt 232 lb 12.8 oz (105.597 kg)  BMI 34.36 kg/m2  Physical Exam Patient is in NAD HEENT:  Normocephalic, atraumatic. EOMI, PERRLA.  Neck: JVP is normal.  No bruits.  Lungs: clear to auscultation. No rales no wheezes.  Heart: Regular rate and rhythm. Normal S1, S2. No S3.   No significant murmurs. PMI not  displaced.  Abdomen:  Supple, nontender. Normal bowel sounds. No masses. No hepatomegaly.  Extremities:   Good distal pulses throughout. No lower extremity edema.  Musculoskeletal :moving all extremities.  Neuro:   alert and oriented x3.  CN II-XII grossly intact.   Assessment and Plan:  1.  Atrial fibrillatoin.  It does not sound like the patient has had a recurrence  I would keep on ASA with EtOH use.  I would cut back on toprol xl to 75 mg total per day  He is feeling sluggish Cut back on caffeine and EtOH  Stay active F/U in fall  2.  HTN  BP is good  Follow.

## 2012-07-02 NOTE — Patient Instructions (Signed)
Start Aspirin 81mg  daily.  Your physician recommends that you schedule a follow-up appointment in: Early September 2014 with Dr. Tenny Craw.

## 2012-07-04 ENCOUNTER — Encounter: Payer: Self-pay | Admitting: Internal Medicine

## 2012-10-06 ENCOUNTER — Telehealth: Payer: Self-pay | Admitting: Nurse Practitioner

## 2012-10-06 NOTE — Telephone Encounter (Signed)
Pt called this evening stating that he has been experiencing chest pressure for about 30 mins now.  He denies palpitations but can't be sure that he's not in afib.  I recommended that he call 911 and plan to present to the [facility] ED for evaluation.  He verbalized understanding, but said he might prefer to have his wife drive him.  I explained why I thought it would be better to call 911 and he again verbalized understanding.

## 2012-10-28 ENCOUNTER — Ambulatory Visit (INDEPENDENT_AMBULATORY_CARE_PROVIDER_SITE_OTHER): Payer: Non-veteran care | Admitting: Internal Medicine

## 2012-10-28 ENCOUNTER — Encounter: Payer: Self-pay | Admitting: Internal Medicine

## 2012-10-28 VITALS — BP 122/80 | HR 63 | Ht 69.0 in | Wt 225.0 lb

## 2012-10-28 DIAGNOSIS — I4891 Unspecified atrial fibrillation: Secondary | ICD-10-CM

## 2012-10-28 MED ORDER — METOPROLOL SUCCINATE ER 50 MG PO TB24: 50.0000 mg | ORAL_TABLET | Freq: Every day | ORAL | Status: DC

## 2012-10-28 NOTE — Progress Notes (Signed)
HPI Patinet is a 42 yo with history of PAF.  I saw him in ER in APril. He was last in clnic in May.   SInce then he has had 1 other spell of afib  Called in  Told to go to ER  Did not  Spell lasted a few days. Denies SOB  No dizziness  Allergies  Allergen Reactions  . Depakote [Divalproex Sodium]     Current Outpatient Prescriptions  Medication Sig Dispense Refill  . aspirin EC 81 MG tablet Take 1 tablet (81 mg total) by mouth daily.  90 tablet  3  . metoprolol succinate (TOPROL-XL) 50 MG 24 hr tablet Take 75 mg by mouth daily. Take with or immediately following a meal.      . Multiple Vitamins-Minerals (MENS ONE DAILY PO) Take 1 tablet by mouth daily.       No current facility-administered medications for this visit.    Past Medical History  Diagnosis Date  . Hypertension   . Atrial fibrillation     Past Surgical History  Procedure Laterality Date  . Knee surgery      No family history on file.  History   Social History  . Marital Status: Single    Spouse Name: N/A    Number of Children: N/A  . Years of Education: N/A   Occupational History  . Not on file.   Social History Main Topics  . Smoking status: Former Smoker    Quit date: 01/03/2012  . Smokeless tobacco: Not on file  . Alcohol Use: Yes     Comment: occasionally  . Drug Use: Not on file  . Sexual Activity: Not on file   Other Topics Concern  . Not on file   Social History Narrative  . No narrative on file    Review of Systems:  All systems reviewed.  They are negative to the above problem except as previously stated.  Vital Signs: BP 122/80  Pulse 63  Ht 5\' 9"  (1.753 m)  Wt 225 lb (102.059 kg)  BMI 33.21 kg/m2  Physical Exam Patient is in NAD HEENT:  Normocephalic, atraumatic. EOMI, PERRLA.  Neck: JVP is normal.  No bruits.  Lungs: clear to auscultation. No rales no wheezes.  Heart: Regular rate and rhythm. Normal S1, S2. No S3.   No significant murmurs. PMI not displaced.  Abdomen:   Supple, nontender. Normal bowel sounds. No masses. No hepatomegaly.  Extremities:   Good distal pulses throughout. No lower extremity edema.  Musculoskeletal :moving all extremities.  Neuro:   alert and oriented x3.  CN II-XII grossly intact.   Assessment and Plan:  1.  Atrial fibrillation  1 recurrence in 4 mon  Lasted few days  Clinically in SR Would continue toprol  He still feels sluggish  WOuld back down to 1 qd. Follow  Watch caffeine and etoh which he is doing Patient works [place] shift  Only 1 1/2 hours sleep  Not optimal for

## 2012-10-28 NOTE — Patient Instructions (Addendum)
Decrease toprol to 50mg  daily  Your physician recommends that you schedule a follow-up appointment in: March 2015 with Dr. Tenny Craw

## 2012-11-11 ENCOUNTER — Telehealth: Payer: Self-pay | Admitting: *Deleted

## 2012-11-11 MED ORDER — FLECAINIDE ACETATE 150 MG PO TABS: ORAL_TABLET | ORAL | Status: DC

## 2012-11-11 NOTE — Telephone Encounter (Signed)
Discussed with EP If has recurrent afib could give Rx for 150 mg Flecanide. Take once as needed to break Continue Toprol. Call if doesn't work Spoke with pt, aware of dr Tenny Craw recommendations.

## 2013-01-26 ENCOUNTER — Emergency Department (HOSPITAL_COMMUNITY): Payer: Non-veteran care

## 2013-01-26 ENCOUNTER — Telehealth: Payer: Self-pay | Admitting: Internal Medicine

## 2013-01-26 ENCOUNTER — Observation Stay (HOSPITAL_COMMUNITY)
Admission: EM | Admit: 2013-01-26 | Discharge: 2013-01-27 | Disposition: A | Discharge status: Home / Self Care | Payer: Non-veteran care | Attending: Cardiology | Admitting: Cardiology

## 2013-01-26 ENCOUNTER — Encounter (HOSPITAL_COMMUNITY): Payer: Self-pay | Admitting: Emergency Medicine

## 2013-01-26 DIAGNOSIS — Z79899 Other long term (current) drug therapy: Secondary | ICD-10-CM | POA: Insufficient documentation

## 2013-01-26 DIAGNOSIS — R002 Palpitations: Secondary | ICD-10-CM | POA: Insufficient documentation

## 2013-01-26 DIAGNOSIS — Z87891 Personal history of nicotine dependence: Secondary | ICD-10-CM | POA: Diagnosis not present

## 2013-01-26 DIAGNOSIS — R079 Chest pain, unspecified: Secondary | ICD-10-CM

## 2013-01-26 DIAGNOSIS — R0789 Other chest pain: Principal | ICD-10-CM | POA: Insufficient documentation

## 2013-01-26 DIAGNOSIS — I4891 Unspecified atrial fibrillation: Secondary | ICD-10-CM | POA: Diagnosis not present

## 2013-01-26 DIAGNOSIS — R110 Nausea: Secondary | ICD-10-CM | POA: Insufficient documentation

## 2013-01-26 DIAGNOSIS — Z888 Allergy status to other drugs, medicaments and biological substances status: Secondary | ICD-10-CM | POA: Diagnosis not present

## 2013-01-26 DIAGNOSIS — R42 Dizziness and giddiness: Secondary | ICD-10-CM | POA: Diagnosis not present

## 2013-01-26 DIAGNOSIS — Z7982 Long term (current) use of aspirin: Secondary | ICD-10-CM | POA: Insufficient documentation

## 2013-01-26 DIAGNOSIS — I10 Essential (primary) hypertension: Secondary | ICD-10-CM | POA: Insufficient documentation

## 2013-01-26 DIAGNOSIS — Z23 Encounter for immunization: Secondary | ICD-10-CM | POA: Diagnosis not present

## 2013-01-26 DIAGNOSIS — R0602 Shortness of breath: Secondary | ICD-10-CM | POA: Diagnosis not present

## 2013-01-26 DIAGNOSIS — R072 Precordial pain: Secondary | ICD-10-CM | POA: Diagnosis present

## 2013-01-26 DIAGNOSIS — F1010 Alcohol abuse, uncomplicated: Secondary | ICD-10-CM | POA: Diagnosis present

## 2013-01-26 DIAGNOSIS — R739 Hyperglycemia, unspecified: Secondary | ICD-10-CM | POA: Diagnosis present

## 2013-01-26 HISTORY — DX: Psychophysiologic insomnia: F51.04

## 2013-01-26 HISTORY — DX: Personal history of other diseases of the digestive system: Z87.19

## 2013-01-26 HISTORY — DX: Migraine, unspecified, not intractable, without status migrainosus: G43.909

## 2013-01-26 HISTORY — DX: Major depressive disorder, single episode, unspecified: F32.9

## 2013-01-26 HISTORY — DX: Bipolar disorder, unspecified: F31.9

## 2013-01-26 HISTORY — DX: Headache: R51

## 2013-01-26 HISTORY — DX: Depression, unspecified: F32.A

## 2013-01-26 HISTORY — DX: Low back pain: M54.5

## 2013-01-26 HISTORY — DX: Post-traumatic stress disorder, unspecified: F43.10

## 2013-01-26 HISTORY — DX: Other chronic pain: G89.29

## 2013-01-26 HISTORY — DX: Anxiety disorder, unspecified: F41.9

## 2013-01-26 HISTORY — DX: Personal history of peptic ulcer disease: Z87.11

## 2013-01-26 HISTORY — DX: Alcohol abuse, uncomplicated: F10.10

## 2013-01-26 HISTORY — DX: Paroxysmal atrial fibrillation: I48.0

## 2013-01-26 HISTORY — DX: Low back pain, unspecified: M54.50

## 2013-01-26 LAB — BASIC METABOLIC PANEL
Calcium: 8.9 mg/dL (ref 8.4–10.5)
GFR calc Af Amer: >90 mL/min (ref >90)
GFR calc non Af Amer: >90 mL/min (ref >90)
Glucose, Bld: 171 mg/dL — ABNORMAL HIGH (ref 70–99)
Sodium: 138 mEq/L (ref 135–145)

## 2013-01-26 LAB — CBC
Hemoglobin: 15.1 g/dL (ref 13.0–17.0)
MCH: 31.3 pg (ref 26.0–34.0)
MCHC: 35.6 g/dL (ref 30.0–36.0)
Platelets: 201 10*3/uL (ref 150–400)
RDW: 14.4 % (ref 11.5–15.5)

## 2013-01-26 LAB — POCT I-STAT TROPONIN I

## 2013-01-26 MED ORDER — FOLIC ACID 1 MG PO TABS
1.0000 mg | ORAL_TABLET | Freq: Every day | ORAL | Status: DC
  Administered 2013-01-27: 1 mg via ORAL
  Filled 2013-01-26: qty 1

## 2013-01-26 MED ORDER — ASPIRIN EC 81 MG PO TBEC
81.0000 mg | DELAYED_RELEASE_TABLET | Freq: Every day | ORAL | Status: DC
  Administered 2013-01-27: 81 mg via ORAL
  Filled 2013-01-26: qty 1

## 2013-01-26 MED ORDER — LORAZEPAM 1 MG PO TABS
1.0000 mg | ORAL_TABLET | Freq: Four times a day (QID) | ORAL | Status: DC | PRN
  Administered 2013-01-27: 1 mg via ORAL
  Filled 2013-01-26: qty 1

## 2013-01-26 MED ORDER — OXYCODONE-ACETAMINOPHEN 5-325 MG PO TABS
1.0000 | ORAL_TABLET | Freq: Four times a day (QID) | ORAL | Status: DC | PRN
  Administered 2013-01-26 – 2013-01-27 (×2): 1 via ORAL
  Filled 2013-01-26 (×2): qty 1

## 2013-01-26 MED ORDER — THIAMINE HCL 100 MG/ML IJ SOLN
100.0000 mg | Freq: Every day | INTRAMUSCULAR | Status: DC
  Filled 2013-01-26: qty 1

## 2013-01-26 MED ORDER — ONDANSETRON HCL 4 MG/2ML IJ SOLN: 4.0000 mg | Freq: Four times a day (QID) | INTRAMUSCULAR | Status: DC | PRN

## 2013-01-26 MED ORDER — LORAZEPAM 1 MG PO TABS: 0.0000 mg | ORAL_TABLET | Freq: Four times a day (QID) | ORAL | Status: DC

## 2013-01-26 MED ORDER — SODIUM CHLORIDE 0.9 % IV SOLN: 250.0000 mL | INTRAVENOUS | Status: DC | PRN

## 2013-01-26 MED ORDER — ACETAMINOPHEN 325 MG PO TABS: 650.0000 mg | ORAL_TABLET | ORAL | Status: DC | PRN

## 2013-01-26 MED ORDER — ADULT MULTIVITAMIN W/MINERALS CH
1.0000 | ORAL_TABLET | Freq: Every day | ORAL | Status: DC
  Administered 2013-01-27: 1 via ORAL
  Filled 2013-01-26: qty 1

## 2013-01-26 MED ORDER — LORAZEPAM 1 MG PO TABS
0.0000 mg | ORAL_TABLET | Freq: Two times a day (BID) | ORAL | Status: DC
Start: 2013-01-28 — End: 2013-01-27

## 2013-01-26 MED ORDER — METOPROLOL SUCCINATE ER 50 MG PO TB24
50.0000 mg | ORAL_TABLET | Freq: Every day | ORAL | Status: DC
  Administered 2013-01-27: 50 mg via ORAL
  Filled 2013-01-26: qty 1

## 2013-01-26 MED ORDER — SODIUM CHLORIDE 0.9 % IJ SOLN
3.0000 mL | Freq: Two times a day (BID) | INTRAMUSCULAR | Status: DC
  Administered 2013-01-26: 3 mL via INTRAVENOUS

## 2013-01-26 MED ORDER — INFLUENZA VAC SPLIT QUAD 0.5 ML IM SUSP
0.5000 mL | INTRAMUSCULAR | Status: AC
  Administered 2013-01-27: 0.5 mL via INTRAMUSCULAR
  Filled 2013-01-26: qty 0.5

## 2013-01-26 MED ORDER — HEPARIN SODIUM (PORCINE) 5000 UNIT/ML IJ SOLN
5000.0000 [IU] | Freq: Three times a day (TID) | INTRAMUSCULAR | Status: DC
  Filled 2013-01-26 (×5): qty 1

## 2013-01-26 MED ORDER — SODIUM CHLORIDE 0.9 % IJ SOLN: 3.0000 mL | INTRAMUSCULAR | Status: DC | PRN

## 2013-01-26 MED ORDER — LORAZEPAM 2 MG/ML IJ SOLN: 1.0000 mg | Freq: Four times a day (QID) | INTRAMUSCULAR | Status: DC | PRN

## 2013-01-26 MED ORDER — NITROGLYCERIN 0.4 MG SL SUBL
0.4000 mg | SUBLINGUAL_TABLET | SUBLINGUAL | Status: DC | PRN
  Administered 2013-01-26: 0.4 mg via SUBLINGUAL

## 2013-01-26 MED ORDER — VITAMIN B-1 100 MG PO TABS
100.0000 mg | ORAL_TABLET | Freq: Every day | ORAL | Status: DC
  Administered 2013-01-27: 100 mg via ORAL
  Filled 2013-01-26: qty 1

## 2013-01-26 MED ORDER — NAPHAZOLINE HCL 0.1 % OP SOLN
1.0000 [drp] | Freq: Four times a day (QID) | OPHTHALMIC | Status: DC | PRN
  Filled 2013-01-26: qty 15

## 2013-01-26 MED ORDER — ASPIRIN 81 MG PO CHEW
324.0000 mg | CHEWABLE_TABLET | Freq: Once | ORAL | Status: AC
  Administered 2013-01-26: 324 mg via ORAL
  Filled 2013-01-26: qty 4

## 2013-01-26 NOTE — ED Notes (Signed)
The pt reports that he has seen a doctor and they are going to keep him.  He still has the same level of pain since he arrived

## 2013-01-26 NOTE — ED Notes (Signed)
After sl nitro that did not help his pain.  Headache just after the med other than that no change in his chest pain

## 2013-01-26 NOTE — ED Notes (Signed)
Pt c/o some fleeting chest pain.  The pain lasts only 3-4 seconds otherwise he is watching tv.  He reports that he still has not seen a doctor

## 2013-01-26 NOTE — ED Notes (Signed)
The pt just went

## 2013-01-26 NOTE — ED Notes (Signed)
The pt reports that he has been having mid-chest pain for one week with dizziness sob and nausea.  No sob at present no distress.   He came in today for worsening symptoms.  He takes aspirin every other day .  His last was yesterday.  nsr on the monitor. He reports a history of  High bp only

## 2013-01-26 NOTE — ED Notes (Signed)
The pt just returned from xray 

## 2013-01-26 NOTE — ED Notes (Addendum)
Pt states he began having CP 2 days ago that has progressively gotten worse.  Pt with hx of Afib.  Pt also c/o "fluttering" feeling in chest.  Pt states he has blurred vision with hx of same.

## 2013-01-26 NOTE — Telephone Encounter (Signed)
Pt's wife  Called to let Dr. Tenny Craw know that pt  is in A-fibrillation and he is on his way to the ER. Pt states Dr. Tenny Craw would like to know about when he was in A-Fib. Pt / wife are aware that this message will send to MD for reviewing.

## 2013-01-26 NOTE — ED Notes (Signed)
Pt watching tv still c/o pain sl nitro given

## 2013-01-26 NOTE — H&P (Signed)
History and Physical  Patient ID: Stanly Si MRN: [id number], DOB: Oct 07, 1970 Date of Encounter: 01/26/2013, 6:34 PM Primary Physician: No PCP Per Patient Primary Cardiologist: Tenny Craw  Chief Complaint: CP Reason for Admission: CP  HPI: Mr. Stohr is a 42 y/o M with history of PAF diagnosed 05/2012, bipolar disorder/depression, prior EtOH abuse, HTN who presented to [facility] with complaints of chest pain. He developed substernal chest pressure this morning that wrapped around his chest. It feels sharper on the edges. He was laying down when it started. He had intermittent episodes the last few days, typically lasting 10 seconds at a time. However, today's episode started around 10:30am and has been constant since then. His first episode of CP was about 4 years ago. The pain is not worse with movement. Coughing increases the pain and deep breathing feels he feels like he's pushing on something. He does report exertional dyspnea moreso recently. He denies any dyspnea at rest, LEE, or orthopnea. He took flecainide at home and it did not help so he came to the ER. He endorsed associated nausea, sweatiness, blurry vision. He denied any GI bleeding. He received 324mg  ASA in the ER. He initially denied any pain with palpation but on physical exam he is tender with palpation. Despite constant discomfort he has remained in NSR. Initial troponin negative. CBG 171 otherwise labs nonacute. He is slightly hypertensive in the ED, currently 154/103.  Past Medical History  Diagnosis Date  . Hypertension   . PAF (paroxysmal atrial fibrillation)     a. Dx 05/2012.  [name] ETOH abuse   . Bipolar disorder     h/o SI  . Depression      Most Recent Cardiac Studies: 2D Echo 05/2012 Left ventricle: The cavity size was normal. Wall thickness was increased in a pattern of mild LVH. Systolic function was normal. The estimated ejection fraction was in the range of 60% to 65%.     Surgical History:  Past  Surgical History  Procedure Laterality Date  . Knee surgery       Home Meds: Prior to Admission medications   Medication Sig Start Date End Date Taking? Authorizing Provider  aspirin EC 81 MG tablet Take 1 tablet (81 mg total) by mouth daily. 07/02/12  Yes Pricilla Riffle, MD  flecainide [facility]) 150 MG tablet One tablet as needed for atrial fib 11/11/12  Yes Pricilla Riffle, MD  metoprolol succinate (TOPROL-XL) 50 MG 24 hr tablet Take 1 tablet (50 mg total) by mouth daily. Take with or immediately following a meal. 10/28/12  Yes Pricilla Riffle, MD  Multiple Vitamins-Minerals (MENS ONE DAILY PO) Take 1 tablet by mouth daily.   Yes Historical Provider, MD  naphazoline-glycerin (CLEAR EYES) 0.012-0.2 % SOLN Place 1-2 drops into both eyes every 4 (four) hours as needed for irritation.   Yes Historical Provider, MD    Allergies:  Allergies  Allergen Reactions  . Depakote [Divalproex Sodium] Anaphylaxis and Shortness Of Breath    History   Social History  . Marital Status: Married    Spouse Name: N/A    Number of Children: N/A  . Years of Education: N/A   Occupational History  . Not on file.   Social History Main Topics  . Smoking status: Former Smoker    Quit date: 01/03/2012  . Smokeless tobacco: Not on file     Comment: Quit ~2013  . Alcohol Use: Yes     Comment: occasionally - mostly weekends  .  Drug Use: No  . Sexual Activity: Not on file   Other Topics Concern  . Not on file   Social History Narrative  . No narrative on file     Family History  Problem Relation Age of Onset  . CAD Mother   . Diabetes Mother     Review of Systems: General: negative for chills, fever Cardiovascular: see above Dermatological: negative for rash Respiratory: negative for cough or wheezing Urologic: negative for hematuria Abdominal: negative for diarrhea, bright red blood per rectum, melena, or hematemesis Neurologic: negative for visual syncope, or dizziness All other systems  reviewed and are otherwise negative except as noted above.  Labs:   Lab Results  Component Value Date   WBC 5.8 01/26/2013   HGB 15.1 01/26/2013   HCT 42.4 01/26/2013   MCV 87.8 01/26/2013   PLT 201 01/26/2013    Recent Labs Lab 01/26/13 1316  NA 138  K 3.5  CL 103  CO2 21  BUN 9  CREATININE 0.72  CALCIUM 8.9  GLUCOSE 171*   No results found for this basename: CKTOTAL, CKMB, TROPONINI,  in the last 72 hours Lab Results  Component Value Date   CHOL 165 06/12/2012   HDL 41 06/12/2012   LDLCALC 85 06/12/2012   TRIG 193* 06/12/2012   Lab Results  Component Value Date   DDIMER  Value: <0.22        AT THE INHOUSE ESTABLISHED CUTOFF VALUE OF 0.48 ug/mL FEU, THIS ASSAY HAS BEEN DOCUMENTED IN THE LITERATURE TO HAVE 08/16/2006    Radiology/Studies:  Dg Chest 2 View  01/26/2013   CLINICAL DATA:  Chest pain.  EXAM: CHEST  2 VIEW  COMPARISON:  06/11/2012 and 04/18/2009  FINDINGS: There is cardiomegaly. Pulmonary vascularity is normal and the lungs are clear. No osseous abnormality. No effusions.  IMPRESSION: Chronic cardiomegaly.  No acute abnormalities.   Electronically Signed   By: Geanie Cooley M.D.   On: 01/26/2013 15:38     EKG: NSR 76bpm TWI III, minimal voltage criteria for LVH, otherwise no acute changes  Physical Exam: Blood pressure 149/100, pulse 69, temperature 97.3 F (36.3 C), temperature source Oral, resp. rate 16, height 5\' 9"  (1.753 m), weight 230 lb (104.327 kg), SpO2 100.00%. General: Well developed, well nourished overweight M in no acute distress. Extensive tattoos noted. Head: Normocephalic, atraumatic, sclera non-icteric, no xanthomas, nares are without discharge.  Neck: Negative for carotid bruits. JVD not elevated. Lungs: Clear bilaterally to auscultation without wheezes, rales, or rhonchi. Breathing is unlabored. Heart: RRR with S1 S2. No murmurs, rubs, or gallops appreciated. Abdomen: Soft, non-tender, non-distended with normoactive bowel sounds. No  hepatomegaly. No rebound/guarding. No obvious abdominal masses. Msk:  Strength and tone appear normal for age. Extremities: No clubbing or cyanosis. No edema.  Distal pedal pulses are 2+ and equal bilaterally. Neuro: Alert and oriented X 3. No focal deficit. No facial asymmetry. Moves all extremities spontaneously. Psych:  Responds to questions appropriately with a normal affect.    ASSESSMENT AND PLAN:  1. Chest pain 2. PAF maintaining NSR 3. HTN 4. Hyperglycemia 5. Habitual EtOH use  Signed, Dayna Dunn PA-C 01/26/2013, 6:34 PM  As above, patient seen and examined. Briefly he is a 42 year old male with a past medical history of paroxysmal atrial fibrillation, hypertension, bipolar disorder with chest pain. Patient has had intermittent chest pain for approximately 4 years. The pain is substernal with radiation to the right breast. It is described as a tightness and occasional sharp  sensation. It can increase with inspiration and cough. It is not related to food. It is not exertional. He complains of associated dyspnea, nausea and diaphoresis. He developed recurrent pain at 10:30 AM today. The pain has been continuous. Initial enzymes negative. Electrocardiogram shows sinus rhythm with no significant ST changes. Symptoms are atypical and most likely musculoskeletal as they are reproduced with palpation. Plan cycle enzymes. If negative discharge tomorrow morning with outpatient functional study. Patient remains in sinus rhythm. Continue aspirin as his only embolic risk factor is hypertension. He has flecanide as needed. Continue beta blocker. His blood pressure is elevated. Monitor and add additional medications as needed. Olga Millers

## 2013-01-26 NOTE — ED Provider Notes (Signed)
CSN: [id number]     Arrival date & time 01/26/13  1259 History   First MD Initiated Contact with Patient 01/26/13 1508     Chief Complaint  Patient presents with  . Chest Pain   (Consider location/radiation/quality/duration/timing/severity/associated sxs/prior Treatment) HPI Comments: Patient is a 42 year old male with a past medical history of atrial fibrillation and hypertension who presents with chest pain since 10:30am today. Patient was laying down and had sudden onset of right chest pain that moved to his central chest that radiates to his back. The pain is a pressure sensation that is severe. The pain has been constant since the onset. He reports associated palpitations, nausea, SOB, and lightheadedness. He reports taking a Flecainide when the symptoms started, as directed by his Cardiologist, Dr. Tenny Craw, however, this did not improve his symptoms. No aggravating/alleviating factors. No other associated symptoms. Patient continues to have pain and other associated symptoms, although the palpitations have resolved. Patient thinks he may have been in atrial fibrillation for a brief period of time.    Past Medical History  Diagnosis Date  . Hypertension   . Atrial fibrillation    Past Surgical History  Procedure Laterality Date  . Knee surgery     No family history on file. History  Substance Use Topics  . Smoking status: Former Smoker    Quit date: 01/03/2012  . Smokeless tobacco: Not on file  . Alcohol Use: Yes     Comment: occasionally    Review of Systems  Constitutional: Negative for fever, chills and fatigue.  HENT: Negative for trouble swallowing.   Eyes: Negative for visual disturbance.  Respiratory: Positive for shortness of breath.   Cardiovascular: Positive for chest pain and palpitations.  Gastrointestinal: Positive for nausea. Negative for vomiting, abdominal pain and diarrhea.  Genitourinary: Negative for dysuria and difficulty urinating.  Musculoskeletal:  Negative for arthralgias and neck pain.  Skin: Negative for color change.  Neurological: Negative for dizziness and weakness.  Psychiatric/Behavioral: Negative for dysphoric mood.    Allergies  Depakote  Home Medications   Current Outpatient Rx  Name  Route  Sig  Dispense  Refill  . aspirin EC 81 MG tablet   Oral   Take 1 tablet (81 mg total) by mouth daily.   90 tablet   3   . flecainide (TAMBOCOR) 150 MG tablet      One tablet as needed for atrial fib   30 tablet   12   . metoprolol succinate (TOPROL-XL) 50 MG 24 hr tablet   Oral   Take 1 tablet (50 mg total) by mouth daily. Take with or immediately following a meal.   30 tablet   6   . Multiple Vitamins-Minerals (MENS ONE DAILY PO)   Oral   Take 1 tablet by mouth daily.         . naphazoline-glycerin (CLEAR EYES) 0.012-0.2 % SOLN   Both Eyes   Place 1-2 drops into both eyes every 4 (four) hours as needed for irritation.          BP 161/100  Temp(Src) 97.3 F (36.3 C) (Oral)  Resp 20  Ht 5\' 9"  (1.753 m)  Wt 230 lb (104.327 kg)  BMI 33.95 kg/m2  SpO2 100% Physical Exam  Nursing note and vitals reviewed. Constitutional: He is oriented to person, place, and time. He appears well-developed and well-nourished. No distress.  HENT:  Head: Normocephalic and atraumatic.  Eyes: Conjunctivae and EOM are normal.  Neck: Normal range of  motion.  Cardiovascular: Normal rate and regular rhythm.  Exam reveals no gallop and no friction rub.   No murmur heard. Pulmonary/Chest: Effort normal and breath sounds normal. He has no wheezes. He has no rales. He exhibits no tenderness.  Abdominal: Soft. He exhibits no distension. There is no tenderness. There is no rebound and no guarding.  Musculoskeletal: Normal range of motion.  Neurological: He is alert and oriented to person, place, and time. Coordination normal.  Speech is goal-oriented. Moves limbs without ataxia.   Skin: Skin is warm and dry.  Psychiatric: He has a  normal mood and affect. His behavior is normal.    ED Course  Procedures (including critical care time) Labs Review Labs Reviewed  BASIC METABOLIC PANEL - Abnormal; Notable for the following:    Glucose, Bld 171 (*)    All other components within normal limits  CBC  POCT I-STAT TROPONIN I  POCT I-STAT TROPONIN I   Imaging Review Dg Chest 2 View  01/26/2013   CLINICAL DATA:  Chest pain.  EXAM: CHEST  2 VIEW  COMPARISON:  06/11/2012 and 04/18/2009  FINDINGS: There is cardiomegaly. Pulmonary vascularity is normal and the lungs are clear. No osseous abnormality. No effusions.  IMPRESSION: Chronic cardiomegaly.  No acute abnormalities.   Electronically Signed   By: Geanie Cooley M.D.   On: 01/26/2013 15:38    EKG Interpretation   None       MDM   1. Chest pain   2. Chest pain at rest     3:49 PM Labs unremarkable for acute change. EKG shows normal sinus rhythm without acute changes. Initial troponin negative. Patient continues to have chest pain and will have ASA and nitro. Vitals stable and patient afebrile.   8:50 PM [facility] saw the patient and will admit for observation.    Emilia Beck, PA-C 01/26/13 2050

## 2013-01-26 NOTE — Telephone Encounter (Signed)
New Message   Pt is in AFIB// states that he now going to the ER// He requests a call back even though he is going to the ER//please assist

## 2013-01-27 ENCOUNTER — Other Ambulatory Visit: Payer: Self-pay | Admitting: Physician Assistant

## 2013-01-27 DIAGNOSIS — R072 Precordial pain: Secondary | ICD-10-CM

## 2013-01-27 LAB — BASIC METABOLIC PANEL
BUN: 11 mg/dL (ref 6–23)
Chloride: 104 mEq/L (ref 96–112)
GFR calc Af Amer: >90 mL/min (ref >90)
GFR calc non Af Amer: >90 mL/min (ref >90)
Glucose, Bld: 104 mg/dL — ABNORMAL HIGH (ref 70–99)
Potassium: 3.5 mEq/L (ref 3.5–5.1)
Sodium: 139 mEq/L (ref 135–145)

## 2013-01-27 LAB — HEPATIC FUNCTION PANEL
ALT: 31 U/L (ref 0–53)
AST: 17 U/L (ref 0–37)
Albumin: 3.7 g/dL (ref 3.5–5.2)
Alkaline Phosphatase: 76 U/L (ref 39–117)
Bilirubin, Direct: 0.1 mg/dL (ref 0.0–0.3)
Indirect Bilirubin: 0.5 mg/dL (ref 0.3–0.9)
Total Protein: 7.5 g/dL (ref 6.0–8.3)

## 2013-01-27 LAB — LIPID PANEL
Cholesterol: 174 mg/dL (ref 0–200)
Total CHOL/HDL Ratio: 3.6 RATIO
VLDL: 26 mg/dL (ref 0–40)

## 2013-01-27 LAB — TSH: TSH: 0.763 u[IU]/mL (ref 0.350–4.500)

## 2013-01-27 LAB — CBC
HCT: 43.1 % (ref 39.0–52.0)
Hemoglobin: 15.3 g/dL (ref 13.0–17.0)
MCH: 31.3 pg (ref 26.0–34.0)
MCHC: 35.5 g/dL (ref 30.0–36.0)
WBC: 6.8 10*3/uL (ref 4.0–10.5)

## 2013-01-27 LAB — TROPONIN I: Troponin I: <0.3 ng/mL (ref <0.30)

## 2013-01-27 LAB — HEMOGLOBIN A1C: Mean Plasma Glucose: 117 mg/dL — ABNORMAL HIGH (ref <117)

## 2013-01-27 MED ORDER — TRAMADOL HCL 50 MG PO TABS: 50.0000 mg | ORAL_TABLET | Freq: Four times a day (QID) | ORAL | Status: DC | PRN

## 2013-01-27 NOTE — Discharge Summary (Signed)
CARDIOLOGY DISCHARGE SUMMARY   Patient ID: Scott Walter MRN: [id number] DOB/AGE: [birth year] 42 y.o.  Admit date: 01/26/2013 Discharge date: 01/31/2013  PCP: [facility] Primary Cardiologist: PR  Primary Discharge Diagnosis:  Precordial pain    Secondary Discharge Diagnosis:    PAF (paroxysmal atrial fibrillation)   HTN (hypertension)   ETOH abuse   Hyperglycemia  Procedures: CXR  Hospital Course: Scott Walter is a 42 y.o. male with no history of CAD.  He is followed by Dr. Tenny Craw for PAF and is on flecainide PRN. He had prolonged chest pain and he came to the hospital. His chest wall was slightly tender to palpation. He was in sinus rhythm and his ECG had no acute ischemic changes. His initial troponin was negative. He was hyperglycemic and hypertensive, but labs were otherwise normal. He was admitted for further evaluation and treatment.  His cardiac enzymes remained negative for MI. Chest x-ray was performed and reviewed and showed no acute abnormality. His chest pain resolved. He was kept on telemetry and maintained sinus rhythm.  On 01/27/2013, he was seen by Dr. Patty Sermons. Dr. Patty Sermons evaluated Scott Walter and reviewed all data. He felt that no further inpatient workup was indicated and Scott Walter could be safely discharged home, to follow up as an outpatient with a stress test.  Labs:  Pending: Hemoglobin A1c  Lab Results  Component Value Date   WBC 6.8 01/27/2013   HGB 15.3 01/27/2013   HCT 43.1 01/27/2013   MCV 88.1 01/27/2013   PLT 193 01/27/2013     Recent Labs Lab 01/26/13 2330 01/27/13 0600  NA  --  139  K  --  3.5  CL  --  104  CO2  --  26  BUN  --  11  CREATININE  --  0.81  CALCIUM  --  8.7  PROT 7.5  --   BILITOT 0.6  --   ALKPHOS 76  --   ALT 31  --   AST 17  --   GLUCOSE  --  104*   Lab Results  Component Value Date   TROPONINI <0.30 01/27/2013   Lipid Panel     Component Value Date/Time   CHOL 174 01/27/2013 0600   TRIG 129  01/27/2013 0600   HDL 48 01/27/2013 0600   CHOLHDL 3.6 01/27/2013 0600   VLDL 26 01/27/2013 0600   LDLCALC 100* 01/27/2013 0600   Lab Results  Component Value Date   HGBA1C 5.7* 01/26/2013    Radiology: Dg Chest 2 View 01/26/2013   CLINICAL DATA:  Chest pain.  EXAM: CHEST  2 VIEW  COMPARISON:  06/11/2012 and 04/18/2009  FINDINGS: There is cardiomegaly. Pulmonary vascularity is normal and the lungs are clear. No osseous abnormality. No effusions.  IMPRESSION: Chronic cardiomegaly.  No acute abnormalities.   Electronically Signed   By: Geanie Cooley M.D.   On: 01/26/2013 15:38   EKG:  Vent. rate 76 BPM PR interval 142 ms QRS duration 86 ms QT/QTc 392/441 ms P-R-T axes 45 36 5   FOLLOW UP PLANS AND APPOINTMENTS Allergies  Allergen Reactions  . Depakote [Divalproex Sodium] Anaphylaxis and Shortness Of Breath  . Ambien [Zolpidem Tartrate] Other (See Comments)    "Nightmares and mood swings     Medication List         aspirin EC 81 MG tablet  Take 1 tablet (81 mg total) by mouth daily.     flecainide 150 MG tablet  Commonly known as:  TAMBOCOR  One tablet as needed for atrial fib     MENS ONE DAILY PO  Take 1 tablet by mouth daily.     metoprolol succinate 50 MG 24 hr tablet  Commonly known as:  TOPROL-XL  Take 1 tablet (50 mg total) by mouth daily. Take with or immediately following a meal.     naphazoline-glycerin 0.012-0.2 % Soln  Commonly known as:  CLEAR EYES  Place 1-2 drops into both eyes every 4 (four) hours as needed for irritation.     traMADol 50 MG tablet  Commonly known as:  ULTRAM  Take 1-2 tablets (50-100 mg total) by mouth every 6 (six) hours as needed for moderate pain.        Discharge Orders   Future Appointments Provider Department Dept Phone   02/16/2013 9:15 AM [facility] [place]) [facility] SITE 3 NUCLEAR MED [phone]   02/21/2013 11:45 AM Pricilla Riffle, MD [facility] [phone]   Future  Orders Complete By Expires   Diet - low sodium heart healthy  As directed    Increase activity slowly  As directed      Follow-up Information   Follow up with  [facility] On 02/16/2013. (Treadmill stress test at 9:15 am)    Contact information:   [address] [place] [address]       Follow up with Dietrich Pates, MD On 02/21/2013. (at 11:45 am)    Specialty:  Cardiology   Contact information:   [address] [place] [address] [phone]       BRING ALL MEDICATIONS WITH YOU TO FOLLOW UP APPOINTMENTS  Time spent with patient to include physician time: 34 min Signed: Theodore Demark, PA-C 01/31/2013, 8:02 AM Co-Sign MD

## 2013-01-27 NOTE — ED Provider Notes (Signed)
Medical screening examination/treatment/procedure(s) were performed by non-physician practitioner and as supervising physician I was immediately available for consultation/collaboration.  EKG Interpretation    Date/Time:  Wednesday January 26 2013 13:03:23 EST Ventricular Rate:  76 PR Interval:  142 QRS Duration: 86 QT Interval:  392 QTC Calculation: 441 R Axis:   36 Text Interpretation:  Normal sinus rhythm Normal ECG No significant change since last tracing Confirmed by DOCHERTY  MD, MEGAN ([id number]) on 01/27/2013 12:07:39 PM              Shanna Cisco, MD 01/27/13 1207

## 2013-01-27 NOTE — Progress Notes (Signed)
Patient Name: Scott Walter Date of Encounter: 01/27/2013     Active Problems:   PAF (paroxysmal atrial fibrillation)   HTN (hypertension)   ETOH abuse   Chest pain   Hyperglycemia    SUBJECTIVE  Still mild chest discomfort. Tender to touch over anterior chest.  CURRENT MEDS . aspirin EC  81 mg Oral Daily  . folic acid  1 mg Oral Daily  . heparin  5,000 Units Subcutaneous Q8H  . influenza vac split quadrivalent PF  0.5 mL Intramuscular Tomorrow-1000  . LORazepam  0-4 mg Oral Q6H   Followed by  . [START ON 01/28/2013] LORazepam  0-4 mg Oral Q12H  . metoprolol succinate  50 mg Oral Daily  . multivitamin with minerals  1 tablet Oral Daily  . sodium chloride  3 mL Intravenous Q12H  . thiamine  100 mg Oral Daily   Or  . thiamine  100 mg Intravenous Daily    OBJECTIVE  Filed Vitals:   01/26/13 1945 01/26/13 2115 01/26/13 2224 01/27/13 0439  BP: 130/61 149/94 152/92 143/87  Pulse: 63 76 67 80  Temp:   98.1 F (36.7 C) 97.6 F (36.4 C)  TempSrc:    Oral  Resp: 17 14 16 18   Height:   5\' 9"  (1.753 m)   Weight:   228 lb 1.6 oz (103.465 kg)   SpO2: 99% 98% 96% 97%   No intake or output data in the 24 hours ending 01/27/13 0846 Filed Weights   01/26/13 1303 01/26/13 2224  Weight: 230 lb (104.327 kg) 228 lb 1.6 oz (103.465 kg)    PHYSICAL EXAM  General: Pleasant, NAD. Neuro: Alert and oriented X 3. Moves all extremities spontaneously. Psych: Normal affect. HEENT:  Normal  Neck: Supple without bruits or JVD. Lungs:  Resp regular and unlabored, CTA. Heart: RRR no s3, s4, or murmurs. Tender to palpation.over anterior chest. Abdomen: Soft, non-tender, non-distended, BS + x 4.  Extremities: No clubbing, cyanosis or edema. DP/PT/Radials 2+ and equal bilaterally.  Accessory Clinical Findings  CBC  Recent Labs  01/26/13 1316 01/27/13 0600  WBC 5.8 6.8  HGB 15.1 15.3  HCT 42.4 43.1  MCV 87.8 88.1  PLT 201 193   Basic Metabolic Panel  Recent Labs   57/84/69 1316 01/27/13 0600  NA 138 139  K 3.5 3.5  CL 103 104  CO2 21 26  GLUCOSE 171* 104*  BUN 9 11  CREATININE 0.72 0.81  CALCIUM 8.9 8.7   Liver Function Tests  Recent Labs  01/26/13 2330  AST 17  ALT 31  ALKPHOS 76  BILITOT 0.6  PROT 7.5  ALBUMIN 3.7   No results found for this basename: LIPASE, AMYLASE,  in the last 72 hours Cardiac Enzymes  Recent Labs  01/26/13 2330 01/27/13 0600  TROPONINI <0.30 <0.30   BNP No components found with this basename: POCBNP,  D-Dimer No results found for this basename: DDIMER,  in the last 72 hours Hemoglobin A1C No results found for this basename: HGBA1C,  in the last 72 hours Fasting Lipid Panel  Recent Labs  01/27/13 0600  CHOL 174  HDL 48  LDLCALC 100*  TRIG 129  CHOLHDL 3.6   Thyroid Function Tests No results found for this basename: TSH, T4TOTAL, FREET3, T3FREE, THYROIDAB,  in the last 72 hours  TELE  NSR  ECG  No ischemic  Changes.  Radiology/Studies  Dg Chest 2 View  01/26/2013   CLINICAL DATA:  Chest pain.  EXAM: CHEST  2 VIEW  COMPARISON:  06/11/2012 and 04/18/2009  FINDINGS: There is cardiomegaly. Pulmonary vascularity is normal and the lungs are clear. No osseous abnormality. No effusions.  IMPRESSION: Chronic cardiomegaly.  No acute abnormalities.   Electronically Signed   By: Geanie Cooley M.D.   On: 01/26/2013 15:38    ASSESSMENT AND PLAN 1. Chest pain  2. PAF maintaining NSR  3. HTN  4. Hyperglycemia  5. Habitual EtOH use  Plan: Cardiac enzymes are normal.  Okay for discharge today and return to office for treadmill Myoview.  Signed, Cassell Clement  MD

## 2013-01-27 NOTE — Progress Notes (Signed)
UR completed 

## 2013-02-16 ENCOUNTER — Ambulatory Visit (HOSPITAL_COMMUNITY): Payer: Non-veteran care | Attending: Cardiology | Admitting: Radiology

## 2013-02-16 VITALS — BP 121/85 | Ht 69.0 in | Wt 226.0 lb

## 2013-02-16 DIAGNOSIS — R0989 Other specified symptoms and signs involving the circulatory and respiratory systems: Secondary | ICD-10-CM | POA: Insufficient documentation

## 2013-02-16 DIAGNOSIS — R072 Precordial pain: Secondary | ICD-10-CM

## 2013-02-16 DIAGNOSIS — R079 Chest pain, unspecified: Secondary | ICD-10-CM

## 2013-02-16 DIAGNOSIS — I10 Essential (primary) hypertension: Secondary | ICD-10-CM | POA: Insufficient documentation

## 2013-02-16 DIAGNOSIS — I4891 Unspecified atrial fibrillation: Secondary | ICD-10-CM | POA: Insufficient documentation

## 2013-02-16 DIAGNOSIS — R42 Dizziness and giddiness: Secondary | ICD-10-CM | POA: Insufficient documentation

## 2013-02-16 DIAGNOSIS — R0609 Other forms of dyspnea: Secondary | ICD-10-CM | POA: Insufficient documentation

## 2013-02-16 DIAGNOSIS — R0602 Shortness of breath: Secondary | ICD-10-CM

## 2013-02-16 DIAGNOSIS — R0789 Other chest pain: Secondary | ICD-10-CM | POA: Insufficient documentation

## 2013-02-16 DIAGNOSIS — Z87891 Personal history of nicotine dependence: Secondary | ICD-10-CM | POA: Insufficient documentation

## 2013-02-16 MED ORDER — TECHNETIUM TC 99M SESTAMIBI GENERIC - CARDIOLITE
33.0000 | Freq: Once | INTRAVENOUS | Status: AC | PRN
  Administered 2013-02-16: 33 via INTRAVENOUS

## 2013-02-16 MED ORDER — TECHNETIUM TC 99M SESTAMIBI GENERIC - CARDIOLITE
11.0000 | Freq: Once | INTRAVENOUS | Status: AC | PRN
  Administered 2013-02-16: 11 via INTRAVENOUS

## 2013-02-16 NOTE — Progress Notes (Signed)
[facility] SITE 3 NUCLEAR MED [address] [place], [place] [address] [phone]    Cardiology Nuclear Med Study  Scott Walter is a 42 y.o. male     MRN : [id number]     DOB: 1970-07-31  Procedure Date: 02/16/2013  Nuclear Med Background Indication for Stress Test:  Evaluation for Ischemia and [facility] 01/2013 for CP with neg. enzymes History:  hx Afib; Echo 2014-EF 60-65% Cardiac Risk Factors: History of Smoking and Hypertension  Symptoms:  Chest Pain, Dizziness, DOE and SOB   Nuclear Pre-Procedure Caffeine/Decaff Intake:  None NPO After: 7:00pm   Lungs:  clear O2 Sat: 96% on room air. IV 0.9% NS with Angio Cath:  22g  IV Site: R Hand  IV Started by:  Doyne Keel, CNMT  Chest Size (in):  44 Cup Size: n/a  Height: 5\' 9"  (1.753 m)  Weight:  226 lb (102.513 kg)  BMI:  Body mass index is 33.36 kg/(m^2). Tech Comments:  Held toprol 48 hours; held all am meds    Nuclear Med Study 1 or 2 day study: 1 day  Stress Test Type:  Stress  Reading MD: Marca Ancona, MD  Order Authorizing Provider:  Lovina Reach, MD  Resting Radionuclide: Technetium 15m Sestamibi  Resting Radionuclide Dose: 11.0 mCi   Stress Radionuclide:  Technetium 88m Sestamibi  Stress Radionuclide Dose: 33.0 mCi           Stress Protocol Rest HR: 60 Stress HR: 169  Rest BP: 121/85 Stress BP: 170/95  Exercise Time (min): 8:00 METS: 10.10   Predicted Max HR: 178 bpm % Max HR: 94.94 bpm Rate Pressure Product: 01027   Dose of Adenosine (mg):  n/a Dose of Lexiscan: n/a mg  Dose of Atropine (mg): n/a Dose of Dobutamine: n/a mcg/kg/min (at max HR)  Stress Test Technologist: Frederick Peers, EMT-P  Nuclear Technologist:  Domenic Polite, CNMT     Rest Procedure:  Myocardial perfusion imaging was performed at rest 45 minutes following the intravenous administration of Technetium 48m Sestamibi. Rest ECG: NSR - Normal EKG  Stress Procedure:  The patient exercised on the treadmill utilizing the Bruce  Protocol for 8:00 minutes. The patient stopped due to sob/knee pain and denied any chest pain.  Technetium 8m Sestamibi was injected at peak exercise and myocardial perfusion imaging was performed after a brief delay. Stress ECG: No significant change from baseline ECG  QPS Raw Data Images:  Normal; no motion artifact; normal heart/lung ratio. Stress Images:  Normal homogeneous uptake in all areas of the myocardium. Rest Images:  Normal homogeneous uptake in all areas of the myocardium. Subtraction (SDS):  There is no evidence of scar or ischemia. Transient Ischemic Dilatation (Normal <1.22):  0.91 Lung/Heart Ratio (Normal <0.45):  0.27  Quantitative Gated Spect Images QGS EDV:  113 ml QGS ESV:  52 ml  Impression Exercise Capacity:  Fair exercise capacity. BP Response:  Normal blood pressure response. Clinical Symptoms:  Dyspnea. ECG Impression:  No significant ST segment change suggestive of ischemia. Comparison with Prior Nuclear Study: No images to compare  Overall Impression:  Normal stress nuclear study.  LV Ejection Fraction: 55%.  LV Wall Motion:  NL LV Function; NL Wall Motion  Marca Ancona 02/16/2013

## 2013-02-21 ENCOUNTER — Encounter: Payer: Self-pay | Admitting: Internal Medicine

## 2013-02-21 ENCOUNTER — Ambulatory Visit (INDEPENDENT_AMBULATORY_CARE_PROVIDER_SITE_OTHER): Payer: Federal, State, Local not specified - PPO | Admitting: Internal Medicine

## 2013-02-21 VITALS — BP 149/94 | HR 73 | Ht 69.0 in | Wt 232.1 lb

## 2013-02-21 DIAGNOSIS — I4891 Unspecified atrial fibrillation: Secondary | ICD-10-CM

## 2013-02-21 DIAGNOSIS — R079 Chest pain, unspecified: Secondary | ICD-10-CM

## 2013-02-21 DIAGNOSIS — R0609 Other forms of dyspnea: Secondary | ICD-10-CM

## 2013-02-21 DIAGNOSIS — R0989 Other specified symptoms and signs involving the circulatory and respiratory systems: Secondary | ICD-10-CM

## 2013-02-21 DIAGNOSIS — R0600 Dyspnea, unspecified: Secondary | ICD-10-CM

## 2013-02-21 DIAGNOSIS — I10 Essential (primary) hypertension: Secondary | ICD-10-CM

## 2013-02-21 NOTE — Progress Notes (Signed)
HPI Patinet is a 43 yo with history of PAF.  I saw him in ER in APril. He was last in clnic in May.   The patient called in on 01/26/13 Felt heart racing  Took Flecanide  Developed chest pain.  Called in  Did not get call back before went to ER. In hosp r/o for MI  CP was felt to be atypical, prob musculoskeletal as worse with palpation. Note that he was in SR on arrival and had no afib He had a stress myoview last week  This was normal The patient denies further afib  Chest symptoms have improved  He does say he gives out easier than he did in past.  Used to run 5 miles per day several yeas ago  Weighed 200 lbs at time Worried about causng problems if he does too much  Allergies  Allergen Reactions  . Depakote [Divalproex Sodium] Anaphylaxis and Shortness Of Breath  . Ambien [Zolpidem Tartrate] Other (See Comments)    "Nightmares and mood swings    Current Outpatient Prescriptions  Medication Sig Dispense Refill  . aspirin EC 81 MG tablet Take 1 tablet (81 mg total) by mouth daily.  90 tablet  3  . flecainide (TAMBOCOR) 150 MG tablet One tablet as needed for atrial fib  30 tablet  12  . metoprolol succinate (TOPROL-XL) 50 MG 24 hr tablet Take 1 tablet (50 mg total) by mouth daily. Take with or immediately following a meal.  30 tablet  6  . Multiple Vitamins-Minerals (MENS ONE DAILY PO) Take 1 tablet by mouth daily.      . naphazoline-glycerin (CLEAR EYES) 0.012-0.2 % SOLN Place 1-2 drops into both eyes every 4 (four) hours as needed for irritation.      . traMADol (ULTRAM) 50 MG tablet Take 1-2 tablets (50-100 mg total) by mouth every 6 (six) hours as needed for moderate pain.  30 tablet  0   No current facility-administered medications for this visit.    Past Medical History  Diagnosis Date  . Hypertension   . PAF (paroxysmal atrial fibrillation)     a. Dx 05/2012.  [name] ETOH abuse   . Bipolar disorder     h/o SI  . Depression   . Heart murmur     "when I was a baby"  (01/26/2013)  . Anginal pain     "last couple days" (01/26/2013)  . Shortness of breath     "can come up at anytime lately" (01/26/2013)  . History of stomach ulcers ~ 2009  . UKGURKYH(062.3)     "monthly" (01/26/2013)  . Migraine     "maybe 2 in my lifetime" (01/26/2013)  . Chronic lower back pain   . Anxiety   . Chronic insomnia   . PTSD (post-traumatic stress disorder)     Past Surgical History  Procedure Laterality Date  . Knee arthroscopy Right 09/2011    Family History  Problem Relation Age of Onset  . CAD Mother   . Diabetes Mother     History   Social History  . Marital Status: Married    Spouse Name: N/A    Number of Children: N/A  . Years of Education: N/A   Occupational History  . Not on file.   Social History Main Topics  . Smoking status: Former Smoker -- 1.00 packs/day for 30 years    Types: Cigarettes    Quit date: 01/03/2012  . Smokeless tobacco: Former Systems developer    Types: Loss adjuster, chartered  Comment: 01/26/2013 "quit chewing > 20 yr ago"  . Alcohol Use: 14.4 oz/week    24 Cans of beer per week     Comment: 01/26/2013 "case of beer q weekend"  . Drug Use: No  . Sexual Activity: Not Currently   Other Topics Concern  . Not on file   Social History Narrative  . No narrative on file    Review of Systems:  All systems reviewed.  They are negative to the above problem except as previously stated.  Vital Signs: BP 149/94  Pulse 73  Ht 5\' 9"  (1.753 m)  Wt 232 lb 1.9 oz (105.289 kg)  BMI 34.26 kg/m2  Physical Exam Patient is in NAD HEENT:  Normocephalic, atraumatic. EOMI, PERRLA.  Neck: JVP is normal.  No bruits.  Lungs: clear to auscultation. No rales no wheezes.  Heart: Regular rate and rhythm. Normal S1, S2. No S3.   No significant murmurs. PMI not displaced.  Abdomen:  Supple, nontender. Normal bowel sounds. No masses. No hepatomegaly.  Extremities:   Good distal pulses throughout. No lower extremity edema.  Musculoskeletal :moving all extremities.   Neuro:   alert and oriented x3.  CN II-XII grossly intact.   Assessment and Plan:  1.  Atrial fibrillation  No documented spell in Dec  Keep on prn Flecanide  2.  CP  Atypical  Myoview normal  Tried to reassure patient  3.  Dyspnea.  Echo from April did not comment on diastolic dysfunction   There is normal systolic function.  Question if relatd to deconditioning and obesity  Rec diet. He eats a lot of carbs esp at night.  Should cut back  Even 10 t o15 lbs.    F/U in Aug.

## 2013-02-21 NOTE — Patient Instructions (Signed)
Your physician wants you to follow-up in: AUGUST 2015 You will receive a reminder letter in the mail two months in advance. If you don't receive a letter, please call our office to schedule the follow-up appointment.

## 2013-03-02 ENCOUNTER — Encounter: Payer: Self-pay | Admitting: Internal Medicine

## 2013-03-02 ENCOUNTER — Ambulatory Visit (INDEPENDENT_AMBULATORY_CARE_PROVIDER_SITE_OTHER): Payer: Federal, State, Local not specified - PPO | Admitting: Internal Medicine

## 2013-03-02 VITALS — BP 124/84 | HR 72 | Temp 98.2°F | Ht 69.5 in | Wt 228.0 lb

## 2013-03-02 DIAGNOSIS — R0602 Shortness of breath: Secondary | ICD-10-CM

## 2013-03-02 DIAGNOSIS — I480 Paroxysmal atrial fibrillation: Secondary | ICD-10-CM

## 2013-03-02 DIAGNOSIS — F319 Bipolar disorder, unspecified: Secondary | ICD-10-CM

## 2013-03-02 DIAGNOSIS — G4700 Insomnia, unspecified: Secondary | ICD-10-CM

## 2013-03-02 DIAGNOSIS — R7309 Other abnormal glucose: Secondary | ICD-10-CM

## 2013-03-02 DIAGNOSIS — F5104 Psychophysiologic insomnia: Secondary | ICD-10-CM | POA: Insufficient documentation

## 2013-03-02 DIAGNOSIS — R739 Hyperglycemia, unspecified: Secondary | ICD-10-CM

## 2013-03-02 DIAGNOSIS — I4891 Unspecified atrial fibrillation: Secondary | ICD-10-CM

## 2013-03-02 DIAGNOSIS — F1010 Alcohol abuse, uncomplicated: Secondary | ICD-10-CM

## 2013-03-02 MED ORDER — MIRTAZAPINE 15 MG PO TABS: 15.0000 mg | ORAL_TABLET | Freq: Every day | ORAL | Status: DC

## 2013-03-02 MED ORDER — ESZOPICLONE 2 MG PO TABS: 2.0000 mg | ORAL_TABLET | Freq: Every evening | ORAL | Status: DC | PRN

## 2013-03-02 NOTE — Assessment & Plan Note (Signed)
Patient understands to followup with psychiatrist regarding her sleep and ongoing intermittent alcohol binge drinking.  He has history of PTSD.

## 2013-03-02 NOTE — Assessment & Plan Note (Signed)
Patient history chronic insomnia. He has failed Depakote in the past. If caused shortness of breath. He has not seen his [place] psychiatrist in over a year. He plans to establish with a new psychiatrist at [facility] facility in [place], [place].  He may benefit from stabilizers which does limit. He has resisted trying lithium in the past.  Despite risk of weight gain and hyperglycemia with atypical antipsychotic agents. Patient may benefit from such agents as Abilify.

## 2013-03-02 NOTE — Assessment & Plan Note (Signed)
Patient reports very poor sleep quality. He is only able to sleep 2-3 hours at a time. This is despite taking trazodone 150 mg and clonazepam 0.5 mg twice a day as needed. Trial of Lunesta 2 mg at bedtime. He is unable to tolerate zolpidem.  Patient advised to on good sleep hygiene.  He will decrease his caffeine intake.

## 2013-03-02 NOTE — Progress Notes (Signed)
Subjective:    Patient ID: Scott Walter, male    DOB: 1970/05/30, 43 y.o.   MRN: [id number]  HPI  43 year old Hispanic male with history of paroxysmal atrial fibrillation, alcohol abuse, bipolar disorder to establish. Patient followed by Dr. [name] for paroxysmal A. Fib. He has been followed by primary care physician at [facility] near [place], [place].  Patient reports he is also followed by the [facility] psychiatrist. They have tried and failed multiple medications. He is allergic to Depakote. He struggles with chronic severe insomnia. He is unsure whether he has been on atypical antipsychotics in the past.  He is currently taking trazodone 150 mg at bedtime as well as clonazepam 0.5 mg twice a day as needed.  He has history of daily alcohol use in the past. He currently seems about a case of beer per week. He admits to occasionally binge drinking on the weekend.  He quit tobacco a year ago. He smoked on and off since age 50. He has 10 year pack history.  Patient plans to establish with a [facility] psychiatrist in [place], [place].  Paroxysmal atrial fibrillation-patient is currently in sinus rhythm. Patient reports he rarely uses flecanide for intermittent arrhythmia. He is maintained on metoprolol succinate 50 mg once daily.  Patient currently works as a Freight forwarder. He reports intermittent unexplained dyspnea with exertion/shortness of breath.  Chart review performed. He had CBC completed on 01/27/2013. His hemoglobin is 15.3.  Chest x-ray shows chronic cardiomegaly. No acute abnormalities. (01/26/2013)  Patient completed nuclear stress study on 02/21/2013. Impression-normal stress nuclear study. LV ejection fraction 55%. Normal wall motion. Normal LV function.  Echocardiogram 06/11/2012- Cavity size was normal. Wall thickness was increased in pattern of mild LVH. Systolic function was normal. Estimated ejection fraction was in the range of 60-65%. Review of Systems   Constitutional: Negative for activity  change, appetite change. Mild weight gain  Eyes: Negative for visual disturbance.  Respiratory: Negative for cough.  Unexplained shortness of breath   Cardiovascular: Negative for chest pain.  Genitourinary: Negative for difficulty urinating.  Neurological: Negative for headaches.  Gastrointestinal: Negative for abdominal pain, heartburn melena or hematochezia Psych: positive for chronic insomnia Endo:  No polyuria or polydypsia      Past Medical History  Diagnosis Date  . Hypertension   . PAF (paroxysmal atrial fibrillation)     a. Dx 05/2012.  [name] ETOH abuse   . Bipolar disorder     h/o SI  . Depression   . Heart murmur     "when I was a baby" (01/26/2013)  . Anginal pain     "last couple days" (01/26/2013)  . Shortness of breath     "can come up at anytime lately" (01/26/2013)  . History of stomach ulcers ~ 2009  . XTKWIOXB(353.2)     "monthly" (01/26/2013)  . Migraine     "maybe 2 in my lifetime" (01/26/2013)  . Chronic lower back pain   . Anxiety   . Chronic insomnia   . PTSD (post-traumatic stress disorder)     History   Social History  . Marital Status: Married    Spouse Name: N/A    Number of Children: N/A  . Years of Education: N/A   Occupational History  . Not on file.   Social History Main Topics  . Smoking status: Former Smoker -- 1.00 packs/day for 30 years    Types: Cigarettes    Quit date: 01/03/2012  . Smokeless tobacco: Former Systems developer    Types:  Chew     Comment: 01/26/2013 "quit chewing > 20 yr ago"  . Alcohol Use: 14.4 oz/week    24 Cans of beer per week     Comment: 01/26/2013 "case of beer q weekend"  . Drug Use: No  . Sexual Activity: Not Currently   Other Topics Concern  . Not on file   Social History Narrative  . No narrative on file    Past Surgical History  Procedure Laterality Date  . Knee arthroscopy Right 09/2011    Family History  Problem Relation Age of Onset  . CAD Mother   . Diabetes Mother     Allergies    Allergen Reactions  . Depakote [Divalproex Sodium] Anaphylaxis and Shortness Of Breath  . Ambien [Zolpidem Tartrate] Other (See Comments)    "Nightmares and mood swings    Current Outpatient Prescriptions on File Prior to Visit  Medication Sig Dispense Refill  . aspirin EC 81 MG tablet Take 1 tablet (81 mg total) by mouth daily.  90 tablet  3  . flecainide (TAMBOCOR) 150 MG tablet One tablet as needed for atrial fib  30 tablet  12  . metoprolol succinate (TOPROL-XL) 50 MG 24 hr tablet Take 1 tablet (50 mg total) by mouth daily. Take with or immediately following a meal.  30 tablet  6  . Multiple Vitamins-Minerals (MENS ONE DAILY PO) Take 1 tablet by mouth daily.      . naphazoline-glycerin (CLEAR EYES) 0.012-0.2 % SOLN Place 1-2 drops into both eyes every 4 (four) hours as needed for irritation.       No current facility-administered medications on file prior to visit.    BP 124/84  Pulse 72  Temp(Src) 98.2 F (36.8 C) (Oral)  Ht 5' 9.5" (1.765 m)  Wt 228 lb (103.42 kg)  BMI 33.20 kg/m2       Objective:   Physical Exam  Constitutional: He is oriented to person, place, and time. He appears well-developed and well-nourished. No distress.  HENT:  Head: Normocephalic and atraumatic.  Right Ear: External ear normal.  Left Ear: External ear normal.  Mouth/Throat: Oropharynx is clear and moist.  Eyes: Conjunctivae are normal. Pupils are equal, round, and reactive to light.  Neck: Neck supple.  Cardiovascular: Normal rate, regular rhythm and normal heart sounds.   No murmur heard. Pulmonary/Chest: Effort normal. He has no wheezes.  Abdominal: Soft. Bowel sounds are normal. There is no tenderness.  Musculoskeletal: Normal range of motion. He exhibits no edema.  Lymphadenopathy:    He has no cervical adenopathy.  Neurological: He is alert and oriented to person, place, and time. No cranial nerve deficit.  Skin: Skin is warm and dry.  Psychiatric: He has a normal mood and  affect. His behavior is normal.          Assessment & Plan:

## 2013-03-02 NOTE — Progress Notes (Signed)
Pre visit review using our clinic review tool, if applicable. No additional management support is needed unless otherwise documented below in the visit note. 

## 2013-03-02 NOTE — Assessment & Plan Note (Addendum)
43 year old Hispanic male with unexplained shortness of breath. He has history of paroxysmal A. Fib. However his systolic ejection fraction is within normal limits. He has history of asthma. Lung exam is unremarkable. Obtain pulmonary function test with diffusion capacity.

## 2013-03-02 NOTE — Patient Instructions (Signed)
Establish with psychiatrist at [facility].  Discuss starting mood stabilizer such as Lamictal.

## 2013-03-02 NOTE — Assessment & Plan Note (Signed)
Patient has multiple family members with type 2 diabetes. He has abdominal obesity. His previous A1c however is unremarkable. Patient understands he is at higher risk for developing type 2 diabetes. I recommended low carbohydrate diet.  He is also treated for bipolar disorder. He could not tolerate Depakote in the past. He understands atypical psychotic agents may his risk for weight gain and type 2 diabetes.

## 2013-03-30 ENCOUNTER — Ambulatory Visit (INDEPENDENT_AMBULATORY_CARE_PROVIDER_SITE_OTHER): Payer: Federal, State, Local not specified - PPO | Admitting: Family Medicine

## 2013-03-30 ENCOUNTER — Encounter: Payer: Self-pay | Admitting: Family Medicine

## 2013-03-30 VITALS — BP 126/90 | HR 75 | Wt 234.0 lb

## 2013-03-30 DIAGNOSIS — M545 Low back pain, unspecified: Secondary | ICD-10-CM

## 2013-03-30 MED ORDER — DICLOFENAC SODIUM 75 MG PO TBEC: 75.0000 mg | DELAYED_RELEASE_TABLET | Freq: Two times a day (BID) | ORAL | Status: DC

## 2013-03-30 MED ORDER — METHOCARBAMOL 500 MG PO TABS: 500.0000 mg | ORAL_TABLET | Freq: Three times a day (TID) | ORAL | Status: DC | PRN

## 2013-03-30 NOTE — Patient Instructions (Signed)
Lumbosacral Strain Lumbosacral strain is a strain of any of the parts that make up your lumbosacral vertebrae. Your lumbosacral vertebrae are the bones that make up the lower third of your backbone. Your lumbosacral vertebrae are held together by muscles and tough, fibrous tissue (ligaments).  CAUSES  A sudden blow to your back can cause lumbosacral strain. Also, anything that causes an excessive stretch of the muscles in the low back can cause this strain. This is typically seen when people exert themselves strenuously, fall, lift heavy objects, bend, or crouch repeatedly. RISK FACTORS  Physically demanding work.  Participation in pushing or pulling sports or sports that require sudden twist of the back (tennis, golf, baseball).  Weight lifting.  Excessive lower back curvature.  Forward-tilted pelvis.  Weak back or abdominal muscles or both.  Tight hamstrings. SIGNS AND SYMPTOMS  Lumbosacral strain may cause pain in the area of your injury or pain that moves (radiates) down your leg.  DIAGNOSIS Your health care provider can often diagnose lumbosacral strain through a physical exam. In some cases, you may need tests such as X-ray exams.  TREATMENT  Treatment for your lower back injury depends on many factors that your clinician will have to evaluate. However, most treatment will include the use of anti-inflammatory medicines. HOME CARE INSTRUCTIONS   Avoid hard physical activities (tennis, racquetball, waterskiing) if you are not in proper physical condition for it. This may aggravate or create problems.  If you have a back problem, avoid sports requiring sudden body movements. Swimming and walking are generally safer activities.  Maintain good posture.  Maintain a healthy weight.  For acute conditions, you may put ice on the injured area.  Put ice in a plastic bag.  Place a towel between your skin and the bag.  Leave the ice on for 20 minutes, 2 3 times a day.  When the  low back starts healing, stretching and strengthening exercises may be recommended. SEEK MEDICAL CARE IF:  Your back pain is getting worse.  You experience severe back pain not relieved with medicines. SEEK IMMEDIATE MEDICAL CARE IF:   You have numbness, tingling, weakness, or problems with the use of your arms or legs.  There is a change in bowel or bladder control.  You have increasing pain in any area of the body, including your belly (abdomen).  You notice shortness of breath, dizziness, or feel faint.  You feel sick to your stomach (nauseous), are throwing up (vomiting), or become sweaty.  You notice discoloration of your toes or legs, or your feet get very cold. MAKE SURE YOU:   Understand these instructions.  Will watch your condition.  Will get help right away if you are not doing well or get worse. Document Released: 11/13/2004 Document Revised: 11/24/2012 Document Reviewed: 09/22/2012 Exit[employer] Patient Information 2014 [employer].  

## 2013-03-30 NOTE — Progress Notes (Signed)
Pre visit review using our clinic review tool, if applicable. No additional management support is needed unless otherwise documented below in the visit note. 
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
## 2013-04-04 ENCOUNTER — Ambulatory Visit: Payer: Non-veteran care | Admitting: Internal Medicine

## 2013-04-07 ENCOUNTER — Ambulatory Visit (INDEPENDENT_AMBULATORY_CARE_PROVIDER_SITE_OTHER): Payer: Federal, State, Local not specified - PPO | Admitting: Internal Medicine

## 2013-04-07 DIAGNOSIS — R0602 Shortness of breath: Secondary | ICD-10-CM

## 2013-04-07 LAB — PULMONARY FUNCTION TEST
DL/VA % pred: 139 %
DL/VA: 6.4 ml/min/mmHg/L
DLCO UNC % PRED: 104 %
DLCO UNC: 32.23 ml/min/mmHg
FEF 25-75 Post: 5.66 L/sec
FEF 25-75 Pre: 4.54 L/sec
FEF2575-%Change-Post: 24 %
FEF2575-%PRED-PRE: 120 %
FEF2575-%Pred-Post: 150 %
FEV1-%Change-Post: 5 %
FEV1-%PRED-PRE: 82 %
FEV1-%Pred-Post: 86 %
FEV1-POST: 3.48 L
FEV1-PRE: 3.31 L
FEV1FVC-%CHANGE-POST: 1 %
FEV1FVC-%PRED-PRE: 110 %
FEV6-%CHANGE-POST: 3 %
FEV6-%Pred-Post: 79 %
FEV6-%Pred-Pre: 76 %
FEV6-Post: 3.91 L
FEV6-Pre: 3.79 L
FEV6FVC-%Change-Post: 0 %
FEV6FVC-%Pred-Post: 102 %
FEV6FVC-%Pred-Pre: 103 %
FVC-%Change-Post: 3 %
FVC-%PRED-POST: 77 %
FVC-%Pred-Pre: 74 %
FVC-Post: 3.91 L
FVC-Pre: 3.79 L
POST FEV1/FVC RATIO: 89 %
POST FEV6/FVC RATIO: 100 %
PRE FEV1/FVC RATIO: 87 %
Pre FEV6/FVC Ratio: 100 %
RV % pred: 58 %
RV: 1.07 L
TLC % PRED: 72 %
TLC: 4.89 L

## 2013-04-07 NOTE — Progress Notes (Signed)
PFT done today. 

## 2013-04-08 ENCOUNTER — Telehealth: Payer: Self-pay | Admitting: Internal Medicine

## 2013-04-08 NOTE — Telephone Encounter (Signed)
done

## 2013-04-08 NOTE — Telephone Encounter (Signed)
PFT was 04/07/13 please advise Dr. Annamaria Boots thanks

## 2013-04-11 ENCOUNTER — Ambulatory Visit (INDEPENDENT_AMBULATORY_CARE_PROVIDER_SITE_OTHER): Payer: Federal, State, Local not specified - PPO | Admitting: Internal Medicine

## 2013-04-11 ENCOUNTER — Encounter: Payer: Self-pay | Admitting: Internal Medicine

## 2013-04-11 VITALS — BP 124/82 | HR 84 | Temp 98.5°F | Ht 69.5 in | Wt 231.0 lb

## 2013-04-11 DIAGNOSIS — R0602 Shortness of breath: Secondary | ICD-10-CM

## 2013-04-11 NOTE — Progress Notes (Signed)
Subjective:    Patient ID: Mcihael Walter, male    DOB: Feb 11, 1971, 43 y.o.   MRN: [id number]  HPI  43 year old male previously with history of paroxysmal A. Fib, alcohol abuse and hypertension for followup regarding dyspnea. Patient completed pulmonary function testing. It was negative for airway obstruction. His diffusing capacity was normal. Patient has mild restriction.  His chest x-ray performed on 01/26/2013 showed chronic cardiomegaly but no other acute abnormalities.   Patient reports dyspnea when you walks at a brisk pace over 100 yards. He denies any chronic cough.  We reviewed his weight history. Patient reports he has gained approximately 40 pounds over last several years.    He has decreased his alcohol consumption.  Review of Systems Negative for chest pain, negative for cough.  No muscle weakness    Past Medical History  Diagnosis Date  . Hypertension   . PAF (paroxysmal atrial fibrillation)     a. Dx 05/2012.  [name] ETOH abuse   . Bipolar disorder     h/o SI  . Depression   . Heart murmur     "when I was a baby" (01/26/2013)  . Anginal pain     "last couple days" (01/26/2013)  . Shortness of breath     "can come up at anytime lately" (01/26/2013)  . History of stomach ulcers ~ 2009  . QIHKVQQV(956.3)     "monthly" (01/26/2013)  . Migraine     "maybe 2 in my lifetime" (01/26/2013)  . Chronic lower back pain   . Anxiety   . Chronic insomnia   . PTSD (post-traumatic stress disorder)     History   Social History  . Marital Status: Married    Spouse Name: N/A    Number of Children: N/A  . Years of Education: N/A   Occupational History  . Not on file.   Social History Main Topics  . Smoking status: Former Smoker -- 1.00 packs/day for 30 years    Types: Cigarettes    Quit date: 01/03/2012  . Smokeless tobacco: Former Systems developer    Types: Chew     Comment: 01/26/2013 "quit chewing > 20 yr ago"  . Alcohol Use: 14.4 oz/week    24 Cans of beer per week   Comment: 01/26/2013 "case of beer q weekend"  . Drug Use: No  . Sexual Activity: Not Currently   Other Topics Concern  . Not on file   Social History Narrative  . No narrative on file    Past Surgical History  Procedure Laterality Date  . Knee arthroscopy Right 09/2011    Family History  Problem Relation Age of Onset  . CAD Mother   . Diabetes Mother     Allergies  Allergen Reactions  . Depakote [Divalproex Sodium] Anaphylaxis and Shortness Of Breath  . Ambien [Zolpidem Tartrate] Other (See Comments)    "Nightmares and mood swings    Current Outpatient Prescriptions on File Prior to Visit  Medication Sig Dispense Refill  . aspirin EC 81 MG tablet Take 1 tablet (81 mg total) by mouth daily.  90 tablet  3  . Biotin 5000 MCG TABS Take 1 tablet by mouth daily.      . clonazePAM (KLONOPIN) 0.5 MG tablet Take 0.5 mg by mouth 2 (two) times daily as needed for anxiety.      . eszopiclone (LUNESTA) 2 MG TABS tablet Take 1 tablet (2 mg total) by mouth at bedtime as needed for sleep. Take immediately before bedtime  30 tablet  1  . flecainide (TAMBOCOR) 150 MG tablet One tablet as needed for atrial fib  30 tablet  12  . methocarbamol (ROBAXIN) 500 MG tablet Take 1 tablet (500 mg total) by mouth every 8 (eight) hours as needed for muscle spasms.  30 tablet  0  . metoprolol succinate (TOPROL-XL) 50 MG 24 hr tablet Take 1 tablet (50 mg total) by mouth daily. Take with or immediately following a meal.  30 tablet  6  . Multiple Vitamins-Minerals (MENS ONE DAILY PO) Take 1 tablet by mouth daily.      . naphazoline-glycerin (CLEAR EYES) 0.012-0.2 % SOLN Place 1-2 drops into both eyes every 4 (four) hours as needed for irritation.       No current facility-administered medications on file prior to visit.    BP 124/82  Pulse 84  Temp(Src) 98.5 F (36.9 C) (Oral)  Ht 5' 9.5" (1.765 m)  Wt 231 lb (104.781 kg)  BMI 33.64 kg/m2    Objective:   Physical Exam  Constitutional: He is  oriented to person, place, and time. He appears well-developed and well-nourished. No distress.  HENT:  Head: Normocephalic and atraumatic.  Right Ear: External ear normal.  Left Ear: External ear normal.  Cardiovascular: Normal rate, regular rhythm and normal heart sounds.   No murmur heard. Pulmonary/Chest: Effort normal and breath sounds normal. He has no wheezes.  Neurological: He is alert and oriented to person, place, and time. No cranial nerve deficit.          Assessment & Plan:

## 2013-04-11 NOTE — Progress Notes (Signed)
Pre visit review using our clinic review tool, if applicable. No additional management support is needed unless otherwise documented below in the visit note. 

## 2013-04-11 NOTE — Assessment & Plan Note (Signed)
Pulmonary function testing was negative for airway obstruction. Patient has mild restriction. His history and chest x-ray is not consistent with interstitial lung disease or sarcoidosis.  Patient agrees to work toward 10-20 pound weight loss. Reevaluate in 4 months. If persistent dyspnea, consider CT of chest and or Functional exercise testing.

## 2013-04-11 NOTE — Patient Instructions (Signed)
Try to lose 10-20 lbs before your next follow up appointment Avoid juices, soft drinks and concentrated sweets. Start low intensity exercise program

## 2013-06-20 ENCOUNTER — Other Ambulatory Visit: Payer: Self-pay | Admitting: *Deleted

## 2013-06-20 DIAGNOSIS — I4891 Unspecified atrial fibrillation: Secondary | ICD-10-CM

## 2013-06-20 MED ORDER — METOPROLOL SUCCINATE ER 50 MG PO TB24: 50.0000 mg | ORAL_TABLET | Freq: Every day | ORAL | Status: DC

## 2013-08-10 ENCOUNTER — Ambulatory Visit: Payer: Federal, State, Local not specified - PPO | Admitting: Internal Medicine

## 2013-08-10 DIAGNOSIS — Z0289 Encounter for other administrative examinations: Secondary | ICD-10-CM

## 2013-08-24 ENCOUNTER — Other Ambulatory Visit: Payer: Self-pay

## 2013-08-24 MED ORDER — METOPROLOL SUCCINATE ER 50 MG PO TB24: 50.0000 mg | ORAL_TABLET | Freq: Every day | ORAL | Status: DC

## 2013-10-09 NOTE — Progress Notes (Signed)
HPI Patinet is a 43 yo with history of PAF.   And in clinic in Jan 2015 Since seen he has had short lived spells of palpitations.  Longest was 5 min  He did take flecanide for this Does get SOB with activity  Even working fork lift gets winded  (had neg GXT myoview last year)   Tried joining gym  SOB with activity.  Has not gone much  Cut back on ETOH  Now has had a case over 2 months.    Allergies  Allergen Reactions  . Depakote [Divalproex Sodium] Anaphylaxis and Shortness Of Breath  . Ambien [Zolpidem Tartrate] Other (See Comments)    "Nightmares and mood swings    Current Outpatient Prescriptions  Medication Sig Dispense Refill  . aspirin EC 81 MG tablet Take 1 tablet (81 mg total) by mouth daily.  90 tablet  3  . Biotin 5000 MCG TABS Take 1 tablet by mouth daily.      . clonazePAM (KLONOPIN) 0.5 MG tablet Take 0.5 mg by mouth 2 (two) times daily as needed for anxiety.      . cyclobenzaprine (FLEXERIL) 10 MG tablet Take 10 mg by mouth.      . diclofenac (VOLTAREN) 75 MG EC tablet Take 75 mg by mouth.      . flecainide (TAMBOCOR) 150 MG tablet One tablet as needed for atrial fib  30 tablet  12  . HYDROcodone-acetaminophen (NORCO/VICODIN) 5-325 MG per tablet Take 1 tablet by mouth 2 (two) times daily as needed.      . methocarbamol (ROBAXIN) 500 MG tablet Take 1 tablet (500 mg total) by mouth every 8 (eight) hours as needed for muscle spasms.  30 tablet  0  . metoprolol succinate (TOPROL-XL) 50 MG 24 hr tablet Take 1 tablet (50 mg total) by mouth daily. Take with or immediately following a meal.  30 tablet  1  . Multiple Vitamins-Minerals (MENS ONE DAILY PO) Take 1 tablet by mouth daily.      . naphazoline-glycerin (CLEAR EYES) 0.012-0.2 % SOLN Place 1-2 drops into both eyes every 4 (four) hours as needed for irritation.      . PredniSONE 10 MG KIT sterapred DS x6 day as directed. Reduce by 1 tablet daily until gone.      . Aspirin Effervescent 500 MG TBEF Take by mouth.      .  eszopiclone (LUNESTA) 1 MG TABS tablet Take 1 mg by mouth.       No current facility-administered medications for this visit.    Past Medical History  Diagnosis Date  . Hypertension   . PAF (paroxysmal atrial fibrillation)     a. Dx 05/2012.  [name] ETOH abuse   . Bipolar disorder     h/o SI  . Depression   . Heart murmur     "when I was a baby" (01/26/2013)  . Anginal pain     "last couple days" (01/26/2013)  . Shortness of breath     "can come up at anytime lately" (01/26/2013)  . History of stomach ulcers ~ 2009  . UTMLYYTK(354.6)     "monthly" (01/26/2013)  . Migraine     "maybe 2 in my lifetime" (01/26/2013)  . Chronic lower back pain   . Anxiety   . Chronic insomnia   . PTSD (post-traumatic stress disorder)     Past Surgical History  Procedure Laterality Date  . Knee arthroscopy Right 09/2011    Family History  Problem Relation Age of  Onset  . CAD Mother   . Diabetes Mother     History   Social History  . Marital Status: Married    Spouse Name: N/A    Number of Children: N/A  . Years of Education: N/A   Occupational History  . Not on file.   Social History Main Topics  . Smoking status: Former Smoker -- 1.00 packs/day for 30 years    Types: Cigarettes    Quit date: 01/03/2012  . Smokeless tobacco: Former Systems developer    Types: Chew     Comment: 01/26/2013 "quit chewing > 20 yr ago"  . Alcohol Use: 14.4 oz/week    24 Cans of beer per week     Comment: 01/26/2013 "case of beer q weekend"  . Drug Use: No  . Sexual Activity: Not Currently   Other Topics Concern  . Not on file   Social History Narrative  . No narrative on file    Review of Systems:  All systems reviewed.  They are negative to the above problem except as previously stated.  Vital Signs: BP 124/82  Pulse 71  Ht 5' 10" (1.778 m)  Wt 231 lb (104.781 kg)  BMI 33.15 kg/m2  Physical Exam Patient is in NAD HEENT:  Normocephalic, atraumatic. EOMI, PERRLA.  Neck: JVP is normal.  No bruits.   Lungs: clear to auscultation. No rales no wheezes.  Heart: Regular rate and rhythm. Normal S1, S2. No S3.   No significant murmurs. PMI not displaced.  Abdomen:  Supple, nontender. Normal bowel sounds. No masses. No hepatomegaly.  Extremities:   Good distal pulses throughout. No lower extremity edema.  Musculoskeletal :moving all extremities.  Neuro:   alert and oriented x3.  CN II-XII grossly intact.  EKG  SR 71  LVH    Assessment and Plan:  1.  Atrial fibrillation  Patient has felt some palpitations  Short lived  Did have one spell that he took Flecanide for  Stopped in 5 min  ?  2.  CP   3.  Dyspnea.  Echo from April did not comment on diastolic dysfunction   There is normal systolic function.  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 cardiopulm testing   F/U in Aug.

## 2013-10-10 ENCOUNTER — Ambulatory Visit (INDEPENDENT_AMBULATORY_CARE_PROVIDER_SITE_OTHER): Payer: Federal, State, Local not specified - PPO | Admitting: Internal Medicine

## 2013-10-10 VITALS — BP 124/82 | HR 71 | Ht 70.0 in | Wt 231.0 lb

## 2013-10-10 DIAGNOSIS — I10 Essential (primary) hypertension: Secondary | ICD-10-CM

## 2013-10-10 MED ORDER — METOPROLOL SUCCINATE ER 50 MG PO TB24: 50.0000 mg | ORAL_TABLET | Freq: Every day | ORAL | Status: DC

## 2013-10-10 NOTE — Patient Instructions (Signed)
Dr [name] will review records and determine when next follow-up needed.

## 2013-11-09 ENCOUNTER — Telehealth: Payer: Self-pay | Admitting: Internal Medicine

## 2013-11-09 NOTE — Telephone Encounter (Signed)
Patient Information:  Caller Name: Darrick Penna  Phone: [phone]  Patient: Scott Walter, Scott Walter  Gender: Male  DOB: 1970-02-18  Age: 43 Years  PCP: Shawna Orleans, Doe-Hyun Herbie [place]) (Adults only)  Office Follow Up:  Does the office need to follow up with this patient?: No  Instructions For The Office: N/A  RN Note:  requesting appt for tomorrow before work  Symptoms  Reason For Call & Symptoms: c/o chest discomfort x 1wk; c/o sharp, brief pains below breast area that are intermittent; not precipitated by anything; takes his breath away when it occurs; occurs once daily; doesn't take anything  Reviewed Health History In EMR: Yes  Reviewed Medications In EMR: Yes  Reviewed Allergies In EMR: Yes  Reviewed Surgeries / Procedures: Yes  Date of Onset of Symptoms: Unknown  Guideline(s) Used:  Chest Pain  Disposition Per Guideline:   See Today in Office  Reason For Disposition Reached:   Intermittent chest pains persist > 3 days  Advice Given:  N/A  Patient Will Follow Care Advice:  YES  Appointment Scheduled:  11/10/2013 08:45:00 Appointment Scheduled Provider:  Kela Millin (only sees ages 65 and up)

## 2013-11-09 NOTE — Telephone Encounter (Signed)
Noted  

## 2013-11-10 ENCOUNTER — Ambulatory Visit (INDEPENDENT_AMBULATORY_CARE_PROVIDER_SITE_OTHER): Payer: Federal, State, Local not specified - PPO | Admitting: Physician Assistant

## 2013-11-10 ENCOUNTER — Encounter: Payer: Self-pay | Admitting: Physician Assistant

## 2013-11-10 VITALS — BP 110/76 | HR 60 | Temp 98.4°F | Resp 18 | Wt 228.5 lb

## 2013-11-10 DIAGNOSIS — R079 Chest pain, unspecified: Secondary | ICD-10-CM

## 2013-11-10 DIAGNOSIS — R197 Diarrhea, unspecified: Secondary | ICD-10-CM

## 2013-11-10 NOTE — Patient Instructions (Addendum)
Due to your normal EKG and the fleeting nature of the pain, it is unlikely cardiac related.   For your diarrhea, you should maintain fluid hydration to prevent dehydration with water mostly, and occasionally mix in an electrolyte solution.  Trial of the BRAT diet discussed to help relieve diarrheal symptoms.  If emergency symptoms discussed during visit developed, seek medical attention immediately.  Followup as needed, or for worsening or persistent symptoms despite treatment.    Chest Pain (Nonspecific) It is often hard to give a diagnosis for the cause of chest pain. There is always a chance that your pain could be related to something serious, such as a heart attack or a blood clot in the lungs. You need to follow up with your doctor. HOME CARE  If antibiotic medicine was given, take it as directed by your doctor. Finish the medicine even if you start to feel better.  For the next few days, avoid activities that bring on chest pain. Continue physical activities as told by your doctor.  Do not use any tobacco products. This includes cigarettes, chewing tobacco, and e-cigarettes.  Avoid drinking alcohol.  Only take medicine as told by your doctor.  Follow your doctor's suggestions for more testing if your chest pain does not go away.  Keep all doctor visits you made. GET HELP IF:  Your chest pain does not go away, even after treatment.  You have a rash with blisters on your chest.  You have a fever. GET HELP RIGHT AWAY IF:   You have more pain or pain that spreads to your arm, neck, jaw, back, or belly (abdomen).  You have shortness of breath.  You cough more than usual or cough up blood.  You have very bad back or belly pain.  You feel sick to your stomach (nauseous) or throw up (vomit).  You have very bad weakness.  You pass out (faint).  You have chills. This is an emergency. Do not wait to see if the problems will go away. Call your local emergency services  (911 in [place] Do not drive yourself to the hospital. MAKE SURE YOU:   Understand these instructions.  Will watch your condition.  Will get help right away if you are not doing well or get worse. Document Released: 07/23/2007 Document Revised: 02/08/2013 Document Reviewed: 07/23/2007 [facility] Patient Information 2015 [place], [place]. This information is not intended to replace advice given to you by your health care provider. Make sure you discuss any questions you have with your health care provider. Diarrhea Diarrhea is watery poop (stool). It can make you feel weak, tired, thirsty, or give you a dry mouth (signs of dehydration). Watery poop is a sign of another problem, most often an infection. It often lasts 2-3 days. It can last longer if it is a sign of something serious. Take care of yourself as told by your doctor. HOME CARE   Drink 1 cup (8 ounces) of fluid each time you have watery poop.  Do not drink the following fluids:  Those that contain simple sugars (fructose, glucose, galactose, lactose, sucrose, maltose).  Sports drinks.  Fruit juices.  Whole milk products.  Sodas.  Drinks with caffeine (coffee, tea, soda) or alcohol.  Oral rehydration solution may be used if the doctor says it is okay. You may make your own solution. Follow this recipe:   - teaspoon table salt.   teaspoon baking soda.   teaspoon salt substitute containing potassium chloride.  1 tablespoons sugar.  1  liter (34 ounces) of water.  Avoid the following foods:  High fiber foods, such as raw fruits and vegetables.  Nuts, seeds, and whole grain breads and cereals.   Those that are sweetened with sugar alcohols (xylitol, sorbitol, mannitol).  Try eating the following foods:  Starchy foods, such as rice, toast, pasta, low-sugar cereal, oatmeal, baked potatoes, crackers, and bagels.  Bananas.  Applesauce.  Eat probiotic-rich foods, such as yogurt and milk products that are  fermented.  Wash your hands well after each time you have watery poop.  Only take medicine as told by your doctor.  Take a warm bath to help lessen burning or pain from having watery poop. GET HELP RIGHT AWAY IF:   You cannot drink fluids without throwing up (vomiting).  You keep throwing up.  You have blood in your poop, or your poop looks black and tarry.  You do not pee (urinate) in 6-8 hours, or there is only a small amount of very dark pee.  You have belly (abdominal) pain that gets worse or stays in the same spot (localizes).  You are weak, dizzy, confused, or light-headed.  You have a very bad headache.  Your watery poop gets worse or does not get better.  You have a fever or lasting symptoms for more than 2-3 days.  You have a fever and your symptoms suddenly get worse. MAKE SURE YOU:   Understand these instructions.  Will watch your condition.  Will get help right away if you are not doing well or get worse. Document Released: 07/23/2007 Document Revised: 06/20/2013 Document Reviewed: 10/12/2011 [facility] Patient Information 2015 [place], [place]. This information is not intended to replace advice given to you by your health care provider. Make sure you discuss any questions you have with your health care provider.

## 2013-11-10 NOTE — Progress Notes (Signed)
Spoke with Dr. Elease Hashimoto regarding this patient, and I agree with him that due to the fleeting nature of the pain and normal EKG and physical exam, this is likely not cardiac in nature. Reassurance given to patient, and watchful waiting for the time being. The patient is amenable to this plan.

## 2013-11-10 NOTE — Progress Notes (Signed)
Pre visit review using our clinic review tool, if applicable. No additional management support is needed unless otherwise documented below in the visit note. 

## 2013-11-10 NOTE — Progress Notes (Signed)
Subjective:    Patient ID: Scott Walter, male    DOB: 26-Jan-1971, 43 y.o.   MRN: [id number]  Chest Pain  This is a new problem. The current episode started in the past 7 days. The onset quality is sudden. The problem occurs 2 to 4 times per day (twice daily, lasts less than 1 minute). The problem has been waxing and waning. The pain is present in the lateral region (right sided). The pain is at a severity of 10/10. The pain is severe. The quality of the pain is described as sharp and stabbing ("knocks the wind out of me"). The pain does not radiate. Associated symptoms include headaches (had yesterday, resolved) and shortness of breath (whenever the CP occurs). Pertinent negatives include no abdominal pain, back pain, claudication, cough, diaphoresis, dizziness, exertional chest pressure, fever, hemoptysis, irregular heartbeat, leg pain, lower extremity edema, malaise/fatigue, nausea, near-syncope, numbness, orthopnea, palpitations, PND, sputum production, syncope, vomiting or weakness. The pain is aggravated by nothing. He has tried nothing for the symptoms. Risk factors include male gender.  His past medical history is significant for arrhythmia (A fib) and hypertension.  Pertinent negatives for past medical history include no aneurysm, no anxiety/panic attacks, no aortic aneurysm, no aortic dissection, no bicuspid aortic valve, no CAD, no cancer, no congenital heart disease, no connective tissue disease, no COPD, no CHF, no diabetes, no DVT, no hyperhomocysteinemia, no hyperlipidemia, no Kawasaki disease, no Marfan's syndrome, no MI, no mitral valve prolapse, no pacemaker, no PE, no PVD, no recent injury, no rheumatic fever, no seizures, no sickle cell disease, no sleep apnea, no spontaneous pneumothorax, no stimulant use, no strokes, no thyroid problem, no TIA, Turner syndrome and no valve disorder. Family history comments: Grandparents with some type of heart disease Prior diagnostic workup includes  echocardiogram, exercise treadmill test and chest x-ray.      Review of Systems  Constitutional: Negative for fever, chills, malaise/fatigue and diaphoresis.  Respiratory: Positive for shortness of breath (whenever the CP occurs). Negative for cough, hemoptysis and sputum production.   Cardiovascular: Positive for chest pain. Negative for palpitations, orthopnea, claudication, leg swelling, syncope, PND and near-syncope.  Gastrointestinal: Positive for diarrhea (4-5 times daily for the past 5 days). Negative for nausea, vomiting, abdominal pain and blood in stool.  Musculoskeletal: Negative for back pain.  Neurological: Positive for headaches (had yesterday, resolved). Negative for dizziness, seizures, syncope, weakness and numbness.  All other systems reviewed and are negative.    Past Medical History  Diagnosis Date  . Hypertension   . PAF (paroxysmal atrial fibrillation)     a. Dx 05/2012.  [name] ETOH abuse   . Bipolar disorder     h/o SI  . Depression   . Heart murmur     "when I was a baby" (01/26/2013)  . Anginal pain     "last couple days" (01/26/2013)  . Shortness of breath     "can come up at anytime lately" (01/26/2013)  . History of stomach ulcers ~ 2009  . WPYKDXIP(382.5)     "monthly" (01/26/2013)  . Migraine     "maybe 2 in my lifetime" (01/26/2013)  . Chronic lower back pain   . Anxiety   . Chronic insomnia   . PTSD (post-traumatic stress disorder)     History   Social History  . Marital Status: Married    Spouse Name: N/A    Number of Children: N/A  . Years of Education: N/A   Occupational History  . Not  on file.   Social History Main Topics  . Smoking status: Former Smoker -- 1.00 packs/day for 30 years    Types: Cigarettes    Quit date: 01/03/2012  . Smokeless tobacco: Former Systems developer    Types: Chew     Comment: 01/26/2013 "quit chewing > 20 yr ago"  . Alcohol Use: 14.4 oz/week    24 Cans of beer per week     Comment: 01/26/2013 "case of beer q  weekend"  . Drug Use: No  . Sexual Activity: Not Currently   Other Topics Concern  . Not on file   Social History Narrative  . No narrative on file    Past Surgical History  Procedure Laterality Date  . Knee arthroscopy Right 09/2011    Family History  Problem Relation Age of Onset  . CAD Mother   . Diabetes Mother     Allergies  Allergen Reactions  . Depakote [Divalproex Sodium] Anaphylaxis and Shortness Of Breath  . Ambien [Zolpidem Tartrate] Other (See Comments)    "Nightmares and mood swings    Current Outpatient Prescriptions on File Prior to Visit  Medication Sig Dispense Refill  . aspirin EC 81 MG tablet Take 1 tablet (81 mg total) by mouth daily.  90 tablet  3  . Aspirin Effervescent 500 MG TBEF Take by mouth.      . Biotin 5000 MCG TABS Take 1 tablet by mouth daily.      . clonazePAM (KLONOPIN) 0.5 MG tablet Take 0.5 mg by mouth 2 (two) times daily as needed for anxiety.      . cyclobenzaprine (FLEXERIL) 10 MG tablet Take 10 mg by mouth.      . diclofenac (VOLTAREN) 75 MG EC tablet Take 75 mg by mouth.      . eszopiclone (LUNESTA) 1 MG TABS tablet Take 1 mg by mouth.      . flecainide (TAMBOCOR) 150 MG tablet One tablet as needed for atrial fib  30 tablet  12  . HYDROcodone-acetaminophen (NORCO/VICODIN) 5-325 MG per tablet Take 1 tablet by mouth 2 (two) times daily as needed.      . methocarbamol (ROBAXIN) 500 MG tablet Take 1 tablet (500 mg total) by mouth every 8 (eight) hours as needed for muscle spasms.  30 tablet  0  . metoprolol succinate (TOPROL-XL) 50 MG 24 hr tablet Take 1 tablet (50 mg total) by mouth daily. Take with or immediately following a meal.  90 tablet  3  . Multiple Vitamins-Minerals (MENS ONE DAILY PO) Take 1 tablet by mouth daily.      . naphazoline-glycerin (CLEAR EYES) 0.012-0.2 % SOLN Place 1-2 drops into both eyes every 4 (four) hours as needed for irritation.      . PredniSONE 10 MG KIT sterapred DS x6 day as directed. Reduce by 1 tablet  daily until gone.       No current facility-administered medications on file prior to visit.    EXAM: BP 110/76  Pulse 60  Temp(Src) 98.4 F (36.9 C) (Oral)  Resp 18  Wt 228 lb 8 oz (103.647 kg)     Objective:   Physical Exam  Nursing note and vitals reviewed. Constitutional: He is oriented to person, place, and time. He appears well-developed and well-nourished. No distress.  HENT:  Head: Normocephalic and atraumatic.  Eyes: Conjunctivae and EOM are normal. Pupils are equal, round, and reactive to light.  Neck: Normal range of motion. Neck supple.  Cardiovascular: Normal rate, regular rhythm, normal heart  sounds and intact distal pulses.   Pulmonary/Chest: Effort normal and breath sounds normal. No respiratory distress. He has no wheezes. He has no rales. He exhibits no tenderness.  Abdominal: Soft. Bowel sounds are normal. There is no tenderness.  Musculoskeletal: Normal range of motion. He exhibits no edema and no tenderness.  Neurological: He is alert and oriented to person, place, and time.  Skin: Skin is warm and dry. No rash noted. He is not diaphoretic. No erythema. No pallor.  Psychiatric: He has a normal mood and affect. His behavior is normal. Judgment and thought content normal.    Lab Results  Component Value Date   WBC 6.8 01/27/2013   HGB 15.3 01/27/2013   HCT 43.1 01/27/2013   PLT 193 01/27/2013   GLUCOSE 104* 01/27/2013   CHOL 174 01/27/2013   TRIG 129 01/27/2013   HDL 48 01/27/2013   LDLCALC 100* 01/27/2013   ALT 31 01/26/2013   AST 17 01/26/2013   NA 139 01/27/2013   K 3.5 01/27/2013   CL 104 01/27/2013   CREATININE 0.81 01/27/2013   BUN 11 01/27/2013   CO2 26 01/27/2013   TSH 0.763 01/26/2013   INR 1.0 08/16/2006   HGBA1C 5.7* 01/26/2013    EKG: normal EKG, normal sinus rhythm.      Assessment & Plan:  Doug was seen today for chest pain.  Diagnoses and associated orders for this visit:  Chest pain, unspecified chest pain  type Comments: EKG wnl, fleeting pain, unlikely cardiac. Reassurance, watchful waiting. - EKG 12-Lead  Diarrhea Comments: Conservative therapy. BRAT diet, push fluids, electrolytes. Watchful waiting.    Return precautions provided, and patient handout on diarrhea, chest pain.  Plan to follow up as needed, or for worsening or persistent symptoms despite treatment.  Patient Instructions  Due to your normal EKG and the fleeting nature of the pain, it is unlikely cardiac related.   For your diarrhea, you should maintain fluid hydration to prevent dehydration with water mostly, and occasionally mix in an electrolyte solution.  Trial of the BRAT diet discussed to help relieve diarrheal symptoms.  If emergency symptoms discussed during visit developed, seek medical attention immediately.  Followup as needed, or for worsening or persistent symptoms despite treatment.

## 2014-03-08 ENCOUNTER — Ambulatory Visit (INDEPENDENT_AMBULATORY_CARE_PROVIDER_SITE_OTHER): Payer: Federal, State, Local not specified - PPO | Admitting: Family Medicine

## 2014-03-08 VITALS — BP 130/80 | HR 84 | Temp 97.4°F | Wt 242.0 lb

## 2014-03-08 DIAGNOSIS — R053 Chronic cough: Secondary | ICD-10-CM

## 2014-03-08 DIAGNOSIS — R05 Cough: Secondary | ICD-10-CM

## 2014-03-08 DIAGNOSIS — D179 Benign lipomatous neoplasm, unspecified: Secondary | ICD-10-CM

## 2014-03-08 NOTE — Progress Notes (Signed)
Pre visit review using our clinic review tool, if applicable. No additional management support is needed unless otherwise documented below in the visit note. 

## 2014-03-08 NOTE — Progress Notes (Signed)
Subjective:    Patient ID: Scott Walter, male    DOB: [birth year], 44 y.o.   MRN: [id number]  HPI Here for the following issues  Soft tissue masses right and left arm. These been noted and apparently evaluated once before. These are nontender but sometimes he tends to hit these against objects and has caused mild discomfort then but only temporarily. He has not noted any adenopathy. No recent change in size.  Cough for approximately 2 months. Started right after Thanksgiving. Quit smoking about 2 years ago. No history of asthma. Cough mostly nonproductive. No postnasal drip. He does have some GERD symptoms occasionally and takes over-the-counter acid suppressors. He tends to sleep at an incline. He's actually gained some weight recently though he states appetite is slightly diminished. No recent hemoptysis.  Chronic insomnia. He relates chronic anxiety and has questions regarding counseling for that  Past Medical History  Diagnosis Date  . Hypertension   . PAF (paroxysmal atrial fibrillation)     a. Dx 05/2012.  [name] ETOH abuse   . Bipolar disorder     h/o SI  . Depression   . Heart murmur     "when I was a baby" (01/26/2013)  . Anginal pain     "last couple days" (01/26/2013)  . Shortness of breath     "can come up at anytime lately" (01/26/2013)  . History of stomach ulcers ~ 2009  . XBDZHGDJ(242.6)     "monthly" (01/26/2013)  . Migraine     "maybe 2 in my lifetime" (01/26/2013)  . Chronic lower back pain   . Anxiety   . Chronic insomnia   . PTSD (post-traumatic stress disorder)    Past Surgical History  Procedure Laterality Date  . Knee arthroscopy Right 09/2011    reports that he quit smoking about 2 years ago. His smoking use included Cigarettes. He has a 30 pack-year smoking history. He has quit using smokeless tobacco. His smokeless tobacco use included Chew. He reports that he drinks about 14.4 oz of alcohol per week. He reports that he does not use illicit  drugs. family history includes CAD in his mother; Diabetes in his mother. Allergies  Allergen Reactions  . Depakote [Divalproex Sodium] Anaphylaxis and Shortness Of Breath  . Ambien [Zolpidem Tartrate] Other (See Comments)    "Nightmares and mood swings      Review of Systems  Constitutional: Positive for fatigue. Negative for fever and chills.  HENT: Negative for congestion, postnasal drip, sore throat, trouble swallowing and voice change.   Respiratory: Positive for cough and shortness of breath. Negative for wheezing.   Cardiovascular: Negative for chest pain.  Psychiatric/Behavioral: Positive for sleep disturbance.       Objective:   Physical Exam  Constitutional: He appears well-developed and well-nourished.  HENT:  Right Ear: External ear normal.  Left Ear: External ear normal.  Mouth/Throat: Oropharynx is clear and moist.  Neck: Neck supple. No thyromegaly present.  Cardiovascular: Normal rate and regular rhythm.   Pulmonary/Chest: Effort normal and breath sounds normal. No respiratory distress. He has no wheezes. He has no rales.  Skin:  Right inner arm reveals approximately 1/2 cm fatty consistency mass which is slightly mobile and nontender. He has somewhat similar approximate 1 cm mobile fatty tumor left posterior arm          Assessment & Plan:  #1 probable benign lipomas. Reassurance. Follow-up for any rapid increase in size, pain or other changes #2 chronic cough for almost 2  months duration. Nonfocal exam. Does have some GERD. Obtain chest x-ray given duration. Elevate head of bed 6-8 inches. Over-the-counter PPI.

## 2014-03-08 NOTE — Patient Instructions (Signed)
Lipoma  A lipoma is a noncancerous (benign) tumor composed of fat cells. They are usually found under the skin (subcutaneous). A lipoma may occur in any tissue of the body that contains fat. Common areas for lipomas to appear include the back, shoulders, buttocks, and thighs. Lipomas are a very common soft tissue growth. They are soft and grow slowly. Most problems caused by a lipoma depend on where it is growing.  DIAGNOSIS   A lipoma can be diagnosed with a physical exam. These tumors rarely become cancerous, but radiographic studies can help determine this for certain. Studies used may include:  · Computerized X-ray scans (CT or CAT scan).  · Computerized magnetic scans (MRI).  TREATMENT   Small lipomas that are not causing problems may be watched. If a lipoma continues to enlarge or causes problems, removal is often the best treatment. Lipomas can also be removed to improve appearance. Surgery is done to remove the fatty cells and the surrounding capsule. Most often, this is done with medicine that numbs the area (local anesthetic). The removed tissue is examined under a microscope to make sure it is not cancerous. Keep all follow-up appointments with your caregiver.  SEEK MEDICAL CARE IF:   · The lipoma becomes larger or hard.  · The lipoma becomes painful, red, or increasingly swollen. These could be signs of infection or a more serious condition.  Document Released: 01/24/2002 Document Revised: 04/28/2011 Document Reviewed: 07/06/2009  [employer]® Patient Information ©2015 [employer]. This information is not intended to replace advice given to you by your health care provider. Make sure you discuss any questions you have with your health care provider.

## 2014-03-22 ENCOUNTER — Encounter (HOSPITAL_COMMUNITY): Payer: Self-pay | Admitting: Family Medicine

## 2014-03-22 ENCOUNTER — Emergency Department (HOSPITAL_COMMUNITY)
Admission: EM | Admit: 2014-03-22 | Discharge: 2014-03-22 | Disposition: A | Discharge status: Home / Self Care | Payer: Federal, State, Local not specified - PPO | Attending: Emergency Medicine | Admitting: Emergency Medicine

## 2014-03-22 DIAGNOSIS — J029 Acute pharyngitis, unspecified: Secondary | ICD-10-CM | POA: Diagnosis not present

## 2014-03-22 DIAGNOSIS — Z87891 Personal history of nicotine dependence: Secondary | ICD-10-CM | POA: Diagnosis not present

## 2014-03-22 DIAGNOSIS — B9789 Other viral agents as the cause of diseases classified elsewhere: Secondary | ICD-10-CM

## 2014-03-22 DIAGNOSIS — M542 Cervicalgia: Secondary | ICD-10-CM | POA: Diagnosis not present

## 2014-03-22 DIAGNOSIS — G8929 Other chronic pain: Secondary | ICD-10-CM | POA: Insufficient documentation

## 2014-03-22 DIAGNOSIS — I10 Essential (primary) hypertension: Secondary | ICD-10-CM | POA: Insufficient documentation

## 2014-03-22 DIAGNOSIS — Z8719 Personal history of other diseases of the digestive system: Secondary | ICD-10-CM | POA: Diagnosis not present

## 2014-03-22 DIAGNOSIS — Z7982 Long term (current) use of aspirin: Secondary | ICD-10-CM | POA: Insufficient documentation

## 2014-03-22 DIAGNOSIS — R011 Cardiac murmur, unspecified: Secondary | ICD-10-CM | POA: Insufficient documentation

## 2014-03-22 DIAGNOSIS — R042 Hemoptysis: Secondary | ICD-10-CM | POA: Insufficient documentation

## 2014-03-22 DIAGNOSIS — Z8659 Personal history of other mental and behavioral disorders: Secondary | ICD-10-CM | POA: Insufficient documentation

## 2014-03-22 DIAGNOSIS — J028 Acute pharyngitis due to other specified organisms: Secondary | ICD-10-CM

## 2014-03-22 DIAGNOSIS — Z79899 Other long term (current) drug therapy: Secondary | ICD-10-CM | POA: Insufficient documentation

## 2014-03-22 MED ORDER — HYDROCODONE-ACETAMINOPHEN 5-325 MG PO TABS: 1.0000 | ORAL_TABLET | ORAL | Status: DC | PRN

## 2014-03-22 NOTE — ED Notes (Signed)
t here for a few weeks of sore throat, swollen lymph nodes and hemoptysis. sts he feels like there are razor blades in throat. [facility] with no dx.

## 2014-03-22 NOTE — Discharge Instructions (Signed)
There is no clear cause for your pain. There is no indication that you need antibiotics. Symptomatic treatment with things like ibuprofen or throat lozenges.  Sometimes her helpful. To get checked, further, you should be evaluated by an ear, nose, throat physician, who can help you, by looking at your airway and evaluating your voice box.    Pharyngitis Pharyngitis is redness, pain, and swelling (inflammation) of your pharynx.  CAUSES  Pharyngitis is usually caused by infection. Most of the time, these infections are from viruses (viral) and are part of a cold. However, sometimes pharyngitis is caused by bacteria (bacterial). Pharyngitis can also be caused by allergies. Viral pharyngitis may be spread from person to person by coughing, sneezing, and personal items or utensils (cups, forks, spoons, toothbrushes). Bacterial pharyngitis may be spread from person to person by more intimate contact, such as kissing.  SIGNS AND SYMPTOMS  Symptoms of pharyngitis include:   Sore throat.   Tiredness (fatigue).   Low-grade fever.   Headache.  Joint pain and muscle aches.  Skin rashes.  Swollen lymph nodes.  Plaque-like film on throat or tonsils (often seen with bacterial pharyngitis). DIAGNOSIS  Your health care provider will ask you questions about your illness and your symptoms. Your medical history, along with a physical exam, is often all that is needed to diagnose pharyngitis. Sometimes, a rapid strep test is done. Other lab tests may also be done, depending on the suspected cause.  TREATMENT  Viral pharyngitis will usually get better in 3-4 days without the use of medicine. Bacterial pharyngitis is treated with medicines that kill germs (antibiotics).  HOME CARE INSTRUCTIONS   Drink enough water and fluids to keep your urine clear or pale yellow.   Only take over-the-counter or prescription medicines as directed by your health care provider:   If you are prescribed  antibiotics, make sure you finish them even if you start to feel better.   Do not take aspirin.   Get lots of rest.   Gargle with 8 oz of salt water ( tsp of salt per 1 qt of water) as often as every 1-2 hours to soothe your throat.   Throat lozenges (if you are not at risk for choking) or sprays may be used to soothe your throat. SEEK MEDICAL CARE IF:   You have large, tender lumps in your neck.  You have a rash.  You cough up green, yellow-brown, or bloody spit. SEEK IMMEDIATE MEDICAL CARE IF:   Your neck becomes stiff.  You drool or are unable to swallow liquids.  You vomit or are unable to keep medicines or liquids down.  You have severe pain that does not go away with the use of recommended medicines.  You have trouble breathing (not caused by a stuffy nose). MAKE SURE YOU:   Understand these instructions.  Will watch your condition.  Will get help right away if you are not doing well or get worse. Document Released: 02/03/2005 Document Revised: 11/24/2012 Document Reviewed: 10/11/2012 [facility] Patient Information 2015 [place], [place]. This information is not intended to replace advice given to you by your health care provider. Make sure you discuss any questions you have with your health care provider.  Pain of Unknown Etiology (Pain Without a Known Cause) You have come to your caregiver because of pain. Pain can occur in any part of the body. Often there is not a definite cause. If your laboratory (blood or urine) work was normal and X-rays or other studies were  normal, your caregiver may treat you without knowing the cause of the pain. An example of this is the headache. Most headaches are diagnosed by taking a history. This means your caregiver asks you questions about your headaches. Your caregiver determines a treatment based on your answers. Usually testing done for headaches is normal. Often testing is not done unless there is no response to medications.  Regardless of where your pain is located today, you can be given medications to make you comfortable. If no physical cause of pain can be found, most cases of pain will gradually leave as suddenly as they came.  If you have a painful condition and no reason can be found for the pain, it is important that you follow up with your caregiver. If the pain becomes worse or does not go away, it may be necessary to repeat tests and look further for a possible cause.  Only take over-the-counter or prescription medicines for pain, discomfort, or fever as directed by your caregiver.  For the protection of your privacy, test results cannot be given over the phone. Make sure you receive the results of your test. Ask how these results are to be obtained if you have not been informed. It is your responsibility to obtain your test results.  You may continue all activities unless the activities cause more pain. When the pain lessens, it is important to gradually resume normal activities. Resume activities by beginning slowly and gradually increasing the intensity and duration of the activities or exercise. During periods of severe pain, bed rest may be helpful. Lie or sit in any position that is comfortable.  Ice used for acute (sudden) conditions may be effective. Use a large plastic bag filled with ice and wrapped in a towel. This may provide pain relief.  See your caregiver for continued problems. Your caregiver can help or refer you for exercises or physical therapy if necessary. If you were given medications for your condition, do not drive, operate machinery or power tools, or sign legal documents for 24 hours. Do not drink alcohol, take sleeping pills, or take other medications that may interfere with treatment. See your caregiver immediately if you have pain that is becoming worse and not relieved by medications. Document Released: 10/29/2000 Document Revised: 11/24/2012 Document Reviewed: 02/03/2005 [facility]  Patient Information 2015 [place], [place]. This information is not intended to replace advice given to you by your health care provider. Make sure you discuss any questions you have with your health care provider.

## 2014-03-22 NOTE — ED Provider Notes (Signed)
CSN: [id number]     Arrival date & time 03/22/14  0717 History   First MD Initiated Contact with Patient 03/22/14 [phone]     Chief Complaint  Patient presents with  . Sore Throat  . Hemoptysis     (Consider location/radiation/quality/duration/timing/severity/associated sxs/prior Treatment) HPI  Scott Walter is a 44 y.o. male who presents for several weeks of sore throat, pain in neck, cough, hemoptysis and hoarseness.  Symptoms are progressive and not responding to usual treatments including antihistamines, cough medicine and over-the-counter analgesia.  He saw a primary care doctor and was recommended to use a PPI, but chose not to do that.  He also was seen  In a clinic and had tests for strep and mono which were negative.  He continues to be ill.  He denies nausea, vomiting, fever, chills, weakness or dizziness.  There are no other known modifying factors.    Past Medical History  Diagnosis Date  . Hypertension   . PAF (paroxysmal atrial fibrillation)     a. Dx 05/2012.  [name] ETOH abuse   . Bipolar disorder     h/o SI  . Depression   . Heart murmur     "when I was a baby" (01/26/2013)  . Anginal pain     "last couple days" (01/26/2013)  . Shortness of breath     "can come up at anytime lately" (01/26/2013)  . History of stomach ulcers ~ 2009  . SNKNLZJQ(734.1)     "monthly" (01/26/2013)  . Migraine     "maybe 2 in my lifetime" (01/26/2013)  . Chronic lower back pain   . Anxiety   . Chronic insomnia   . PTSD (post-traumatic stress disorder)    Past Surgical History  Procedure Laterality Date  . Knee arthroscopy Right 09/2011   Family History  Problem Relation Age of Onset  . CAD Mother   . Diabetes Mother    History  Substance Use Topics  . Smoking status: Former Smoker -- 1.00 packs/day for 30 years    Types: Cigarettes    Quit date: 01/03/2012  . Smokeless tobacco: Former Systems developer    Types: Chew     Comment: 01/26/2013 "quit chewing > 20 yr ago"  . Alcohol Use: Yes     Comment: occ    Review of Systems  All other systems reviewed and are negative.     Allergies  Depakote and Ambien  Home Medications   Prior to Admission medications   Medication Sig Start Date End Date Taking? Authorizing Provider  aspirin EC 81 MG tablet Take 1 tablet (81 mg total) by mouth daily. 07/02/12  Yes Fay Records, MD  metoprolol succinate (TOPROL-XL) 50 MG 24 hr tablet Take 1 tablet (50 mg total) by mouth daily. Take with or immediately following a meal. 10/10/13  Yes Fay Records, MD  HYDROcodone-acetaminophen (NORCO) 5-325 MG per tablet Take 1 tablet by mouth every 4 (four) hours as needed. 03/22/14   Richarda Blade, MD  methocarbamol (ROBAXIN) 500 MG tablet Take 1 tablet (500 mg total) by mouth every 8 (eight) hours as needed for muscle spasms. Patient not taking: Reported on 03/22/2014 03/30/13   Eulas Post, MD  naphazoline-glycerin (CLEAR EYES) 0.012-0.2 % SOLN Place 1-2 drops into both eyes every 4 (four) hours as needed for irritation.    Historical Provider, MD   BP 159/111 mmHg  Pulse 83  Temp(Src) 98.3 F (36.8 C) (Oral)  Resp 20  SpO2 95% Physical Exam  Constitutional: He is oriented to person, place, and time. He appears well-developed and well-nourished.  HENT:  Head: Normocephalic and atraumatic.  Right Ear: External ear normal.  Left Ear: External ear normal.  No tonsillar hypertrophy, erythema or exudate.  No oropharynx swelling.  No trismus.  Eyes: Conjunctivae and EOM are normal. Pupils are equal, round, and reactive to light.  Neck: Normal range of motion and phonation normal. Neck supple. No thyromegaly present.  Cardiovascular: Normal rate, regular rhythm and normal heart sounds.   Pulmonary/Chest: Effort normal and breath sounds normal. He exhibits no bony tenderness.  Abdominal: Soft. There is no tenderness.  Musculoskeletal: Normal range of motion.  Lymphadenopathy:    He has no cervical adenopathy.  Neurological: He is alert and  oriented to person, place, and time. No cranial nerve deficit or sensory deficit. He exhibits normal muscle tone. Coordination normal.  Skin: Skin is warm, dry and intact.  Psychiatric: He has a normal mood and affect. His behavior is normal. Judgment and thought content normal.  Nursing note and vitals reviewed.   ED Course  Procedures (including critical care time)   Findings discussed with patient, all questions answered  Labs Review Labs Reviewed - No data to display  Imaging Review No results found.   EKG Interpretation None      MDM   Final diagnoses:  Sore throat (viral)    Nonspecific sore throat, with reassuring signs for absence of severe illness.  He mostly has irritant pharyngitis versus possible viral prolonged illness.  Since he has hoarseness.  He should be evaluated by an ENT to ensure that he does not have a vocal cord nodule.  I doubt serous bacterial infection.  Metabolic instability or impending vascular collapse.     Nursing Notes Reviewed/ Care Coordinated Applicable Imaging Reviewed Interpretation of Laboratory Data incorporated into ED treatment  The patient appears reasonably screened and/or stabilized for discharge and I doubt any other medical condition or other [facility] requiring further screening, evaluation, or treatment in the ED at this time prior to discharge.  Plan: Home Medications- Norco for pain; Home Treatments- SX Rx fr pharyngitis; return here if the recommended treatment, does not improve the symptoms; Recommended follow up- ENT f/u 1-2 weeks     Richarda Blade, MD 03/22/14 [phone]

## 2014-07-18 ENCOUNTER — Inpatient Hospital Stay (HOSPITAL_COMMUNITY): Payer: Federal, State, Local not specified - PPO

## 2014-07-18 ENCOUNTER — Encounter (HOSPITAL_COMMUNITY): Payer: Self-pay | Admitting: Physical Medicine and Rehabilitation

## 2014-07-18 ENCOUNTER — Emergency Department (HOSPITAL_COMMUNITY): Payer: Federal, State, Local not specified - PPO

## 2014-07-18 ENCOUNTER — Observation Stay (HOSPITAL_COMMUNITY)
Admission: EM | Admit: 2014-07-18 | Discharge: 2014-07-20 | Disposition: A | Discharge status: Home / Self Care | Payer: Federal, State, Local not specified - PPO | Attending: Internal Medicine | Admitting: Internal Medicine

## 2014-07-18 DIAGNOSIS — R079 Chest pain, unspecified: Principal | ICD-10-CM | POA: Insufficient documentation

## 2014-07-18 DIAGNOSIS — F329 Major depressive disorder, single episode, unspecified: Secondary | ICD-10-CM | POA: Diagnosis not present

## 2014-07-18 DIAGNOSIS — I480 Paroxysmal atrial fibrillation: Secondary | ICD-10-CM | POA: Diagnosis not present

## 2014-07-18 DIAGNOSIS — F4310 Post-traumatic stress disorder, unspecified: Secondary | ICD-10-CM | POA: Insufficient documentation

## 2014-07-18 DIAGNOSIS — H539 Unspecified visual disturbance: Secondary | ICD-10-CM

## 2014-07-18 DIAGNOSIS — E785 Hyperlipidemia, unspecified: Secondary | ICD-10-CM | POA: Insufficient documentation

## 2014-07-18 DIAGNOSIS — F319 Bipolar disorder, unspecified: Secondary | ICD-10-CM | POA: Diagnosis not present

## 2014-07-18 DIAGNOSIS — F1010 Alcohol abuse, uncomplicated: Secondary | ICD-10-CM | POA: Diagnosis present

## 2014-07-18 DIAGNOSIS — I10 Essential (primary) hypertension: Secondary | ICD-10-CM | POA: Insufficient documentation

## 2014-07-18 DIAGNOSIS — Z7982 Long term (current) use of aspirin: Secondary | ICD-10-CM | POA: Insufficient documentation

## 2014-07-18 DIAGNOSIS — G453 Amaurosis fugax: Secondary | ICD-10-CM | POA: Insufficient documentation

## 2014-07-18 DIAGNOSIS — G43909 Migraine, unspecified, not intractable, without status migrainosus: Secondary | ICD-10-CM | POA: Diagnosis not present

## 2014-07-18 DIAGNOSIS — F419 Anxiety disorder, unspecified: Secondary | ICD-10-CM | POA: Insufficient documentation

## 2014-07-18 DIAGNOSIS — G459 Transient cerebral ischemic attack, unspecified: Secondary | ICD-10-CM | POA: Diagnosis present

## 2014-07-18 DIAGNOSIS — Z87891 Personal history of nicotine dependence: Secondary | ICD-10-CM | POA: Insufficient documentation

## 2014-07-18 DIAGNOSIS — Z888 Allergy status to other drugs, medicaments and biological substances status: Secondary | ICD-10-CM | POA: Insufficient documentation

## 2014-07-18 DIAGNOSIS — I4891 Unspecified atrial fibrillation: Secondary | ICD-10-CM | POA: Diagnosis present

## 2014-07-18 LAB — CREATININE, SERUM
Creatinine, Ser: 0.74 mg/dL (ref 0.61–1.24)
GFR calc non Af Amer: >60 mL/min (ref >60)

## 2014-07-18 LAB — CBC WITH DIFFERENTIAL/PLATELET
BASOS ABS: 0 10*3/uL (ref 0.0–0.1)
Basophils Relative: 1 % (ref 0–1)
Eosinophils Absolute: 0.1 10*3/uL (ref 0.0–0.7)
Eosinophils Relative: 3 % (ref 0–5)
HCT: 42.1 % (ref 39.0–52.0)
HEMOGLOBIN: 14.7 g/dL (ref 13.0–17.0)
Lymphocytes Relative: 39 % (ref 12–46)
Lymphs Abs: 2 10*3/uL (ref 0.7–4.0)
MCH: 30.4 pg (ref 26.0–34.0)
MCHC: 34.9 g/dL (ref 30.0–36.0)
MCV: 87.2 fL (ref 78.0–100.0)
Monocytes Absolute: 0.3 10*3/uL (ref 0.1–1.0)
Monocytes Relative: 5 % (ref 3–12)
NEUTROS ABS: 2.7 10*3/uL (ref 1.7–7.7)
NEUTROS PCT: 52 % (ref 43–77)
Platelets: 181 10*3/uL (ref 150–400)
RBC: 4.83 MIL/uL (ref 4.22–5.81)
RDW: 14.4 % (ref 11.5–15.5)
WBC: 5.1 10*3/uL (ref 4.0–10.5)

## 2014-07-18 LAB — I-STAT CHEM 8, ED
BUN: 10 mg/dL (ref 6–20)
CALCIUM ION: 1.15 mmol/L (ref 1.12–1.23)
Chloride: 106 mmol/L (ref 101–111)
Creatinine, Ser: 0.7 mg/dL (ref 0.61–1.24)
Glucose, Bld: 108 mg/dL — ABNORMAL HIGH (ref 65–99)
HEMATOCRIT: 47 % (ref 39.0–52.0)
HEMOGLOBIN: 16 g/dL (ref 13.0–17.0)
Potassium: 4.3 mmol/L (ref 3.5–5.1)
Sodium: 140 mmol/L (ref 135–145)
TCO2: 21 mmol/L (ref 0–100)

## 2014-07-18 LAB — CBC
HCT: 40.1 % (ref 39.0–52.0)
Hemoglobin: 14.1 g/dL (ref 13.0–17.0)
MCH: 30.7 pg (ref 26.0–34.0)
MCHC: 35.2 g/dL (ref 30.0–36.0)
MCV: 87.2 fL (ref 78.0–100.0)
PLATELETS: 180 10*3/uL (ref 150–400)
RBC: 4.6 MIL/uL (ref 4.22–5.81)
RDW: 14.4 % (ref 11.5–15.5)
WBC: 6.1 10*3/uL (ref 4.0–10.5)

## 2014-07-18 LAB — COMPREHENSIVE METABOLIC PANEL
ALK PHOS: 68 U/L (ref 38–126)
ALT: 27 U/L (ref 17–63)
AST: 29 U/L (ref 15–41)
Albumin: 4 g/dL (ref 3.5–5.0)
Anion gap: 12 (ref 5–15)
BUN: 9 mg/dL (ref 6–20)
CO2: 19 mmol/L — AB (ref 22–32)
Calcium: 8.8 mg/dL — ABNORMAL LOW (ref 8.9–10.3)
Chloride: 107 mmol/L (ref 101–111)
Creatinine, Ser: 0.77 mg/dL (ref 0.61–1.24)
GFR calc non Af Amer: >60 mL/min (ref >60)
GLUCOSE: 124 mg/dL — AB (ref 65–99)
POTASSIUM: 3.8 mmol/L (ref 3.5–5.1)
Sodium: 138 mmol/L (ref 135–145)
Total Bilirubin: 1.3 mg/dL — ABNORMAL HIGH (ref 0.3–1.2)
Total Protein: 7.4 g/dL (ref 6.5–8.1)

## 2014-07-18 LAB — TROPONIN I: Troponin I: <0.03 ng/mL (ref <0.031)

## 2014-07-18 LAB — URINALYSIS, ROUTINE W REFLEX MICROSCOPIC
BILIRUBIN URINE: NEGATIVE
Glucose, UA: NEGATIVE mg/dL
Ketones, ur: NEGATIVE mg/dL
Leukocytes, UA: NEGATIVE
NITRITE: NEGATIVE
Protein, ur: NEGATIVE mg/dL
Specific Gravity, Urine: 1.008 (ref 1.005–1.030)
UROBILINOGEN UA: 0.2 mg/dL (ref 0.0–1.0)
pH: 7 (ref 5.0–8.0)

## 2014-07-18 LAB — I-STAT TROPONIN, ED
TROPONIN I, POC: 0 ng/mL (ref 0.00–0.08)
Troponin i, poc: 0 ng/mL (ref 0.00–0.08)

## 2014-07-18 LAB — RAPID URINE DRUG SCREEN, HOSP PERFORMED
Amphetamines: NOT DETECTED
Barbiturates: NOT DETECTED
Benzodiazepines: NOT DETECTED
Cocaine: NOT DETECTED
Opiates: NOT DETECTED
TETRAHYDROCANNABINOL: NOT DETECTED

## 2014-07-18 LAB — APTT: APTT: 30 s (ref 24–37)

## 2014-07-18 LAB — URINE MICROSCOPIC-ADD ON

## 2014-07-18 LAB — PROTIME-INR
INR: 1.09 (ref 0.00–1.49)
PROTHROMBIN TIME: 14.3 s (ref 11.6–15.2)

## 2014-07-18 MED ORDER — MORPHINE SULFATE 4 MG/ML IJ SOLN
4.0000 mg | Freq: Once | INTRAMUSCULAR | Status: AC
  Administered 2014-07-18: 4 mg via INTRAVENOUS
  Filled 2014-07-18: qty 1

## 2014-07-18 MED ORDER — METOPROLOL SUCCINATE ER 50 MG PO TB24
50.0000 mg | ORAL_TABLET | Freq: Every day | ORAL | Status: DC
  Administered 2014-07-19 – 2014-07-20 (×2): 50 mg via ORAL
  Filled 2014-07-18 (×3): qty 1

## 2014-07-18 MED ORDER — ENOXAPARIN SODIUM 40 MG/0.4ML ~~LOC~~ SOLN
40.0000 mg | SUBCUTANEOUS | Status: DC
Start: 2014-07-18 — End: 2014-07-19
  Administered 2014-07-18: 40 mg via SUBCUTANEOUS
  Filled 2014-07-18 (×2): qty 0.4

## 2014-07-18 MED ORDER — GI COCKTAIL ~~LOC~~
30.0000 mL | Freq: Four times a day (QID) | ORAL | Status: DC | PRN
  Filled 2014-07-18: qty 30

## 2014-07-18 MED ORDER — ACETAMINOPHEN 325 MG PO TABS: 650.0000 mg | ORAL_TABLET | ORAL | Status: DC | PRN

## 2014-07-18 MED ORDER — ASPIRIN 81 MG PO CHEW
162.0000 mg | CHEWABLE_TABLET | Freq: Once | ORAL | Status: DC
  Filled 2014-07-18: qty 2

## 2014-07-18 MED ORDER — ASPIRIN 325 MG PO TABS
325.0000 mg | ORAL_TABLET | Freq: Every day | ORAL | Status: DC
  Administered 2014-07-19: 325 mg via ORAL
  Filled 2014-07-18 (×2): qty 1

## 2014-07-18 MED ORDER — ASPIRIN 81 MG PO CHEW
243.0000 mg | CHEWABLE_TABLET | Freq: Once | ORAL | Status: AC
  Administered 2014-07-18: 243 mg via ORAL

## 2014-07-18 MED ORDER — NITROGLYCERIN 2 % TD OINT
1.0000 [in_us] | TOPICAL_OINTMENT | Freq: Once | TRANSDERMAL | Status: AC
  Administered 2014-07-18: 1 [in_us] via TOPICAL
  Filled 2014-07-18: qty 1

## 2014-07-18 MED ORDER — STROKE: EARLY STAGES OF RECOVERY BOOK
Freq: Once | Status: AC
  Administered 2014-07-18: 22:00:00
  Filled 2014-07-18: qty 1

## 2014-07-18 MED ORDER — MORPHINE SULFATE 2 MG/ML IJ SOLN: 2.0000 mg | INTRAMUSCULAR | Status: DC | PRN

## 2014-07-18 MED ORDER — ONDANSETRON HCL 4 MG/2ML IJ SOLN: 4.0000 mg | Freq: Four times a day (QID) | INTRAMUSCULAR | Status: DC | PRN

## 2014-07-18 NOTE — Consult Note (Addendum)
Patient ID: Scott Walter MRN: [id number], DOB/AGE: August 05, 1970   Admit date: 07/18/2014   Primary Physician: Drema Pry, DO Primary Cardiologist: Dr. [name]  Pt. Profile:  44 y/o male with h/o PAF on daily ASA, admitted for chest pain and TIA symptoms.   Problem List  Past Medical History  Diagnosis Date  . Hypertension   . PAF (paroxysmal atrial fibrillation)     a. Dx 05/2012.  [name] ETOH abuse   . Bipolar disorder     h/o SI  . Depression   . Heart murmur     "when I was a baby" (01/26/2013)  . Anginal pain     "last couple days" (01/26/2013)  . Shortness of breath     "can come up at anytime lately" (01/26/2013)  . History of stomach ulcers ~ 2009  . LZJQBHAL(937.9)     "monthly" (01/26/2013)  . Migraine     "maybe 2 in my lifetime" (01/26/2013)  . Chronic lower back pain   . Anxiety   . Chronic insomnia   . PTSD (post-traumatic stress disorder)     Past Surgical History  Procedure Laterality Date  . Knee arthroscopy Right 09/2011     Allergies  Allergies  Allergen Reactions  . Depakote [Divalproex Sodium] Anaphylaxis and Shortness Of Breath  . Ambien [Zolpidem Tartrate] Other (See Comments)    "Nightmares and mood swings    HPI  The patient is a 44 y/o male, followed by Dr. [name], with a history of PAF on PRN Flecainide. He takes daily ASA due to h/o low CHA2DS2 VASc score with HTN being his only risk factor. He had a GXT Myoview in 2014 that was negative for ischemia. 2D echo 05/2013 demonstrated normal LVF with EF at 60-65%. Left ventricular wall thickness was increased in the pattern of mild LVH. Last OV with Dr. [name] was 09/2013 and patient was stable from a CV standpoint and instructed to f/u in 1 year.   He presented to [facility] ER today with complaints of chest pain and symptoms concerning for TIA. He was in his usual state of health until this am.  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  Started centrally then radiated out laterally to both the right and left thorax. Felt like someone was sitting on his chest. 8/10 in intensity. Occurred intermittently. No aggravating factors. Relived in ER with nitro patch. Also with associated dyspnea during time of chest pain. He also noted sudden acute vision loss out of the left eye and right arm numbness. This lasted approximately 5 minutes before spontaneously resolving. 1 hr later this recurred and lasted on 1 min. No further visual changes. No further extremity numbness. Denies extremity weakness. No slurred speech.  He denies any symptoms of breakthrough atrial fib. Reports full medication compliance, including daily use of ASA. He is currently asymptomatic.    W/U thus far includes normal EKG. Cardiac enzymes negative x 2. CXR unremarkable. CT of head is nonacute.      Home Medications  Prior to Admission medications   Medication Sig Start Date End Date Taking? Authorizing Provider  aspirin EC 81 MG tablet Take 1 tablet (81 mg total) by mouth daily. 07/02/12  Yes Fay Records, MD  fexofenadine (ALLEGRA) 180 MG tablet Take 180 mg by mouth daily.   Yes Historical Provider, MD  metoprolol succinate (TOPROL-XL) 50 MG 24 hr tablet Take 1 tablet (50 mg total) by  mouth daily. Take with or immediately following a meal. 10/10/13  Yes Fay Records, MD  naphazoline-glycerin (CLEAR EYES) 0.012-0.2 % SOLN Place 1-2 drops into both eyes every 4 (four) hours as needed for irritation.   Yes Historical Provider, MD  HYDROcodone-acetaminophen (NORCO) 5-325 MG per tablet Take 1 tablet by mouth every 4 (four) hours as needed. 03/22/14   Daleen Bo, MD  methocarbamol (ROBAXIN) 500 MG tablet Take 1 tablet (500 mg total) by mouth every 8 (eight) hours as needed for muscle spasms. Patient not taking: Reported on 03/22/2014 03/30/13   Eulas Post, MD  metoprolol (LOPRESSOR) 50 MG tablet Take 50 mg by mouth 2 (two) times daily.    Historical Provider, MD     Family History  Family History  Problem Relation Age of Onset  . CAD Mother   . Diabetes Mother     Social History  History   Social History  . Marital Status: Married    Spouse Name: N/A  . Number of Children: N/A  . Years of Education: N/A   Occupational History  . Not on file.   Social History Main Topics  . Smoking status: Former Smoker -- 1.00 packs/day for 30 years    Types: Cigarettes    Quit date: 01/03/2012  . Smokeless tobacco: Former Systems developer    Types: Chew     Comment: 01/26/2013 "quit chewing > 20 yr ago"  . Alcohol Use: Yes     Comment: occ  . Drug Use: No  . Sexual Activity: Not Currently   Other Topics Concern  . Not on file   Social History Narrative     Review of Systems General:  No chills, fever, night sweats or weight changes.  Cardiovascular:  No chest pain, dyspnea on exertion, edema, orthopnea, palpitations, paroxysmal nocturnal dyspnea. Dermatological: No rash, lesions/masses Respiratory: No cough, dyspnea Urologic: No hematuria, dysuria Abdominal:   No nausea, vomiting, diarrhea, bright red blood per rectum, melena, or hematemesis Neurologic:  No visual changes, wkns, changes in mental status. All other systems reviewed and are otherwise negative except as noted above.  Physical Exam  Blood pressure 116/80, pulse 59, temperature 98.1 F (36.7 C), temperature source Oral, resp. rate 18, height 5\' 9"  (1.753 m), weight 248 lb 14.4 oz (112.9 kg), SpO2 98 %.  General: Pleasant, NAD Psych: Normal affect. Neuro: Alert and oriented X 3. Moves all extremities spontaneously. HEENT: Normal  Neck: Supple without bruits or JVD. Lungs:  Resp regular and unlabored, CTA. Heart: RRR no s3, s4, or murmurs. Abdomen: Soft, non-tender, non-distended, BS + x 4.  Extremities: No clubbing, cyanosis or edema. DP/PT/Radials 2+ and equal bilaterally.  Labs  Troponin [facility] of Care Test)  Recent Labs  07/18/14 1336  TROPIPOC 0.00   No results  for input(s): CKTOTAL, CKMB, TROPONINI in the last 72 hours. Lab Results  Component Value Date   WBC 5.1 07/18/2014   HGB 16.0 07/18/2014   HCT 47.0 07/18/2014   MCV 87.2 07/18/2014   PLT 181 07/18/2014     Recent Labs Lab 07/18/14 1130 07/18/14 1338  NA 138 140  K 3.8 4.3  CL 107 106  CO2 19*  --   BUN 9 10  CREATININE 0.77 0.70  CALCIUM 8.8*  --   PROT 7.4  --   BILITOT 1.3*  --   ALKPHOS 68  --   ALT 27  --   AST 29  --   GLUCOSE 124* 108*   Lab  Results  Component Value Date   CHOL 174 01/27/2013   HDL 48 01/27/2013   LDLCALC 100* 01/27/2013   TRIG 129 01/27/2013   Lab Results  Component Value Date   DDIMER  08/16/2006    <0.22        AT THE INHOUSE ESTABLISHED CUTOFF VALUE OF 0.48 ug/mL FEU, THIS ASSAY HAS BEEN DOCUMENTED IN THE LITERATURE TO HAVE     Radiology/Studies  Dg Chest 2 View  07/18/2014   CLINICAL DATA:  Chest pain, shortness of breath.  EXAM: CHEST  2 VIEW  COMPARISON:  January 26, 2013.  FINDINGS: The heart size and mediastinal contours are within normal limits. Both lungs are clear. No pneumothorax or pleural effusion is noted. The visualized skeletal structures are unremarkable.  IMPRESSION: No active cardiopulmonary disease.   Electronically Signed   By: Marijo Conception, M.D.   On: 07/18/2014 12:33   Ct Head Wo Contrast  07/18/2014   CLINICAL DATA:  Sudden onset of left-sided vision loss. Dizziness and lightheadedness.  EXAM: CT HEAD WITHOUT CONTRAST  TECHNIQUE: Contiguous axial images were obtained from the base of the skull through the vertex without intravenous contrast.  COMPARISON:  None.  FINDINGS: Mild age advanced atrophy with sulcal prominence and prominence of the bifrontal extra-axial spaces. The gray-white differentiation is maintained without CT evidence of acute large territory infarct. No intraparenchymal extra-axial mass or hemorrhage. Normal size and configuration of the ventricles and basilar cisterns. No midline shift. There  is underpneumatization of the right frontal sinus. The remaining paranasal sinuses and mastoid air cells are normally aerated. No air-fluid levels. Regional soft tissues appear normal. No displaced calvarial fracture.  IMPRESSION: Mild age advanced atrophy without acute intracranial process.   Electronically Signed   By: Sandi Mariscal M.D.   On: 07/18/2014 14:27    ECG  NSR. No ischemia.    ASSESSMENT AND PLAN  Active Problems:   HTN (hypertension)   ETOH abuse   Visual disturbance   Atrial fibrillation   Chest pain with moderate risk for cardiac etiology   1. Chest Pain with Moderate Risk for Cardiac Etiology: ? If chest pressure from possible recurrent atrial fibrillation vs CAD. Patient noted sudden onset of resting substernal chest pressure. Symptoms relieved with nitro patch. EKG nonacute. Troponins negative x 2. Continue to cycle x 3. Check 2D echo. If he rules-out, will plan for stress test in the am to rule-out ischemia.   2. TIA: CT negative. In setting of PAF, will need to consider initiation of a NOAC. Defer to type and timing to neurology.   3. HTN:   Controlled. Continue metoprolol.  4. PAF: currently in NSR. Continue on telemetry and continue metoprolol. With TIA, his CHA2DS2 VASc score is now 3 (+ HTN). Will need to consider starting on NOAC prior to discharge.     Signed, Lyda Jester, PA-C 07/18/2014, 6:54 PM   I seen and evaluated the patient this evening along with Ellen Henri, PA-C. After reviewing all the clinical data in the chart and discussing the patient with the [facility], personally evaluated the patient and examined him. I agree with her findings on examination and recommendations.  Is a 44 year old M. With history of PAF that was thought to be: A. Fib/only accompanied by hypertension. He has been treated with standing dose of beta blocker along with when necessary flecainide. His initial A. Fib symptoms worse a heaviness in his chest associated  with a sense of "running a race without actually  running ". He has not had a recurrence been several years he was in his usual state of health until he had an episode of what sounded like amaurosis fugax then accompanied with chest tightness pressure but not the sensation of}. I concerned that he probably had atrial fibrillation with or without rapid rate leading to a thromboembolic event without gross defects/TIA. He also had transient right-sided numbness. He has had no arrhythmias on telemetry at. He has not had anymore chest pain.  Active Problems:   Amaurosis fugax of right eye   Essential hypertension   TIA (transient ischemic attack)   Paroxysmal atrial fibrillation   Chest pain with moderate risk for cardiac etiology   ETOH abuse   Visual disturbance   At this point I am concerned that his chest pain is at least moderate cardiac etiology and therefore does be evaluated for ischemic conditions -- Lexiscan Myoview. Cannot exclude possible thromboembolic event of coronary artery, however his troponin levels are negative.  With TIA and hypertension, his CHADS2VASC2 is at least 3 --> Therefore he would benefit from long-term anticoagulation. A phone" ablation until we have an ischemic evaluation, but will cover with twice a day Lovenox. Will consider Xarelto for ease of use palpable that she also did consider standing dose of flecainide to avoid further episodes of A. Fib. I do agree with following up an echocardiogram for EF.  We will follow along, after stress test.   Leonie Man, M.D., M.S. Interventional Cardiologist   Pager # [phone]

## 2014-07-18 NOTE — ED Notes (Signed)
Rob PA at bedside 

## 2014-07-18 NOTE — ED Provider Notes (Signed)
CSN: [id number]     Arrival date & time 07/18/14  1135 History   First MD Initiated Contact with Patient 07/18/14 1307     Chief Complaint  Patient presents with  . Chest Pain  . Shortness of Breath     (Consider location/radiation/quality/duration/timing/severity/associated sxs/prior Treatment) HPI Comments: Patient with past medical history of hypertension, paroxysmal atrial fibrillation, presents emergency department with chief complaint of chest pain. Patient states the symptoms started this morning around 8:30. States that his pain radiated to his back into his arms. He reports associated shortness breath. States that he took 1 baby aspirin this morning. He has not taken anything additionally for his pain. There are no aggravating or alleviating factors. Additionally, patient states that at around 8:30 he lost vision completely in his left eye, this lasted for approximately 5 minutes and then resolved spontaneously. Patient states that he had one additional episode of left eye vision loss that again resolved after about 1 minute. He is not anticoagulated. He is followed by Dr. [name] of cardiology, and Dr. Shawna Orleans of [facility].  The history is provided by the patient. No language interpreter was used.    Past Medical History  Diagnosis Date  . Hypertension   . PAF (paroxysmal atrial fibrillation)     a. Dx 05/2012.  [name] ETOH abuse   . Bipolar disorder     h/o SI  . Depression   . Heart murmur     "when I was a baby" (01/26/2013)  . Anginal pain     "last couple days" (01/26/2013)  . Shortness of breath     "can come up at anytime lately" (01/26/2013)  . History of stomach ulcers ~ 2009  . LKGMWNUU(725.3)     "monthly" (01/26/2013)  . Migraine     "maybe 2 in my lifetime" (01/26/2013)  . Chronic lower back pain   . Anxiety   . Chronic insomnia   . PTSD (post-traumatic stress disorder)    Past Surgical History  Procedure Laterality Date  . Knee arthroscopy Right 09/2011    Family History  Problem Relation Age of Onset  . CAD Mother   . Diabetes Mother    History  Substance Use Topics  . Smoking status: Former Smoker -- 1.00 packs/day for 30 years    Types: Cigarettes    Quit date: 01/03/2012  . Smokeless tobacco: Former Systems developer    Types: Chew     Comment: 01/26/2013 "quit chewing > 20 yr ago"  . Alcohol Use: Yes     Comment: occ    Review of Systems  Constitutional: Negative for fever and chills.  Eyes: Positive for visual disturbance.  Respiratory: Positive for shortness of breath.   Cardiovascular: Positive for chest pain.  Gastrointestinal: Negative for nausea, vomiting, diarrhea and constipation.  Genitourinary: Negative for dysuria.  All other systems reviewed and are negative.     Allergies  Depakote and Ambien  Home Medications   Prior to Admission medications   Medication Sig Start Date End Date Taking? Authorizing Provider  aspirin EC 81 MG tablet Take 1 tablet (81 mg total) by mouth daily. 07/02/12   Fay Records, MD  HYDROcodone-acetaminophen (NORCO) 5-325 MG per tablet Take 1 tablet by mouth every 4 (four) hours as needed. 03/22/14   Daleen Bo, MD  methocarbamol (ROBAXIN) 500 MG tablet Take 1 tablet (500 mg total) by mouth every 8 (eight) hours as needed for muscle spasms. Patient not taking: Reported on 03/22/2014 03/30/13  Eulas Post, MD  metoprolol (LOPRESSOR) 50 MG tablet Take 50 mg by mouth 2 (two) times daily.    Historical Provider, MD  metoprolol succinate (TOPROL-XL) 50 MG 24 hr tablet Take 1 tablet (50 mg total) by mouth daily. Take with or immediately following a meal. 10/10/13   Fay Records, MD  naphazoline-glycerin (CLEAR EYES) 0.012-0.2 % SOLN Place 1-2 drops into both eyes every 4 (four) hours as needed for irritation.    Historical Provider, MD   BP 134/88 mmHg  Pulse 64  Temp(Src) 98 F (36.7 C) (Oral)  Resp 12  Ht 5\' 9"  (1.753 m)  Wt 255 lb (115.667 kg)  BMI 37.64 kg/m2  SpO2 98% Physical Exam   Constitutional: He is oriented to person, place, and time. He appears well-developed and well-nourished.  HENT:  Head: Normocephalic and atraumatic.  Eyes: Conjunctivae and EOM are normal. Pupils are equal, round, and reactive to light. Right eye exhibits no discharge. Left eye exhibits no discharge. No scleral icterus.  Neck: Normal range of motion. Neck supple. No JVD present.  Cardiovascular: Normal rate, regular rhythm and normal heart sounds.  Exam reveals no gallop and no friction rub.   No murmur heard. Pulmonary/Chest: Effort normal and breath sounds normal. No respiratory distress. He has no wheezes. He has no rales. He exhibits no tenderness.  Abdominal: Soft. He exhibits no distension and no mass. There is no tenderness. There is no rebound and no guarding.  Musculoskeletal: Normal range of motion. He exhibits no edema or tenderness.  Neurological: He is alert and oriented to person, place, and time. He has normal reflexes.  CN III-12 intact, normal finger to nose, normal heel to shin, speech is clear, movements are goal oriented, no visual field cuts  Skin: Skin is warm and dry.  Psychiatric: He has a normal mood and affect. His behavior is normal. Judgment and thought content normal.  Nursing note and vitals reviewed.   ED Course  Procedures (including critical care time) Results for orders placed or performed during the hospital encounter of 07/18/14  CBC with Differential  Result Value Ref Range   WBC 5.1 4.0 - 10.5 K/uL   RBC 4.83 4.22 - 5.81 MIL/uL   Hemoglobin 14.7 13.0 - 17.0 g/dL   HCT 42.1 39.0 - 52.0 %   MCV 87.2 78.0 - 100.0 fL   MCH 30.4 26.0 - 34.0 pg   MCHC 34.9 30.0 - 36.0 g/dL   RDW 14.4 11.5 - 15.5 %   Platelets 181 150 - 400 K/uL   Neutrophils Relative % 52 43 - 77 %   Neutro Abs 2.7 1.7 - 7.7 K/uL   Lymphocytes Relative 39 12 - 46 %   Lymphs Abs 2.0 0.7 - 4.0 K/uL   Monocytes Relative 5 3 - 12 %   Monocytes Absolute 0.3 0.1 - 1.0 K/uL    Eosinophils Relative 3 0 - 5 %   Eosinophils Absolute 0.1 0.0 - 0.7 K/uL   Basophils Relative 1 0 - 1 %   Basophils Absolute 0.0 0.0 - 0.1 K/uL  Comprehensive metabolic panel  Result Value Ref Range   Sodium 138 135 - 145 mmol/L   Potassium 3.8 3.5 - 5.1 mmol/L   Chloride 107 101 - 111 mmol/L   CO2 19 (L) 22 - 32 mmol/L   Glucose, Bld 124 (H) 65 - 99 mg/dL   BUN 9 6 - 20 mg/dL   Creatinine, Ser 0.77 0.61 - 1.24 mg/dL  Calcium 8.8 (L) 8.9 - 10.3 mg/dL   Total Protein 7.4 6.5 - 8.1 g/dL   Albumin 4.0 3.5 - 5.0 g/dL   AST 29 15 - 41 U/L   ALT 27 17 - 63 U/L   Alkaline Phosphatase 68 38 - 126 U/L   Total Bilirubin 1.3 (H) 0.3 - 1.2 mg/dL   GFR calc non Af Amer >60 >60 mL/min   GFR calc Af Amer >60 >60 mL/min   Anion gap 12 5 - 15  Protime-INR  Result Value Ref Range   Prothrombin Time 14.3 11.6 - 15.2 seconds   INR 1.09 0.00 - 1.49  APTT  Result Value Ref Range   aPTT 30 24 - 37 seconds  Urine Drug Screen  Result Value Ref Range   Opiates NONE DETECTED NONE DETECTED   Cocaine NONE DETECTED NONE DETECTED   Benzodiazepines NONE DETECTED NONE DETECTED   Amphetamines NONE DETECTED NONE DETECTED   Tetrahydrocannabinol NONE DETECTED NONE DETECTED   Barbiturates NONE DETECTED NONE DETECTED  Urinalysis, Routine w reflex microscopic  Result Value Ref Range   Color, Urine YELLOW YELLOW   APPearance CLEAR CLEAR   Specific Gravity, Urine 1.008 1.005 - 1.030   pH 7.0 5.0 - 8.0   Glucose, UA NEGATIVE NEGATIVE mg/dL   Hgb urine dipstick SMALL (A) NEGATIVE   Bilirubin Urine NEGATIVE NEGATIVE   Ketones, ur NEGATIVE NEGATIVE mg/dL   Protein, ur NEGATIVE NEGATIVE mg/dL   Urobilinogen, UA 0.2 0.0 - 1.0 mg/dL   Nitrite NEGATIVE NEGATIVE   Leukocytes, UA NEGATIVE NEGATIVE  Urine microscopic-add on  Result Value Ref Range   Squamous Epithelial / LPF RARE RARE   WBC, UA 0-2 <3 WBC/hpf   RBC / HPF 0-2 <3 RBC/hpf  I-Stat Troponin, ED (not at [facility])  Result Value Ref Range   Troponin i,  poc 0.00 0.00 - 0.08 ng/mL   Comment 3          I-Stat Chem 8, ED  (not at [facility], [facility])  Result Value Ref Range   Sodium 140 135 - 145 mmol/L   Potassium 4.3 3.5 - 5.1 mmol/L   Chloride 106 101 - 111 mmol/L   BUN 10 6 - 20 mg/dL   Creatinine, Ser 0.70 0.61 - 1.24 mg/dL   Glucose, Bld 108 (H) 65 - 99 mg/dL   Calcium, Ion 1.15 1.12 - 1.23 mmol/L   TCO2 21 0 - 100 mmol/L   Hemoglobin 16.0 13.0 - 17.0 g/dL   HCT 47.0 39.0 - 52.0 %  I-stat troponin, ED (not at [facility], [facility])  Result Value Ref Range   Troponin i, poc 0.00 0.00 - 0.08 ng/mL   Comment 3           Dg Chest 2 View  07/18/2014   CLINICAL DATA:  Chest pain, shortness of breath.  EXAM: CHEST  2 VIEW  COMPARISON:  January 26, 2013.  FINDINGS: The heart size and mediastinal contours are within normal limits. Both lungs are clear. No pneumothorax or pleural effusion is noted. The visualized skeletal structures are unremarkable.  IMPRESSION: No active cardiopulmonary disease.   Electronically Signed   By: Marijo Conception, M.D.   On: 07/18/2014 12:33   Ct Head Wo Contrast  07/18/2014   CLINICAL DATA:  Sudden onset of left-sided vision loss. Dizziness and lightheadedness.  EXAM: CT HEAD WITHOUT CONTRAST  TECHNIQUE: Contiguous axial images were obtained from the base of the skull through the vertex without intravenous contrast.  COMPARISON:  None.  FINDINGS: Mild age advanced atrophy with sulcal prominence and prominence of the bifrontal extra-axial spaces. The gray-white differentiation is maintained without CT evidence of acute large territory infarct. No intraparenchymal extra-axial mass or hemorrhage. Normal size and configuration of the ventricles and basilar cisterns. No midline shift. There is underpneumatization of the right frontal sinus. The remaining paranasal sinuses and mastoid air cells are normally aerated. No air-fluid levels. Regional soft tissues appear normal. No displaced calvarial fracture.  IMPRESSION: Mild age advanced atrophy  without acute intracranial process.   Electronically Signed   By: Sandi Mariscal M.D.   On: 07/18/2014 14:27      EKG Interpretation   Date/Time:  Tuesday Jul 18 2014 11:43:28 EDT Ventricular Rate:  72 PR Interval:  152 QRS Duration: 90 QT Interval:  398 QTC Calculation: 435 R Axis:   17 Text Interpretation:  Normal sinus rhythm Nonspecific T wave abnormality  Abnormal ECG No significant change since last tracing Confirmed by  Winfred Leeds  MD, SAM [phone]) on 07/18/2014 3:30:10 PM      MDM   Final diagnoses:  Chest pain, unspecified chest pain type  Transient cerebral ischemia, unspecified transient cerebral ischemia type    Patient with chest pain that has been constant since this morning. We'll treat pain with morphine. Labs are reassuring. Additionally, patient may have had a TIA. Will check CT head.  CT is negative, labs are reassuring. Patient seen by and discussed with Dr. Cathleen Fears, who recommends admission to medicine. Patient will need to be worked up for chest pain as well as for possible TIA with vision loss symptoms. The symptoms have resolved completely now. Patient understands and agrees with the plan. Pain is currently 2 out of 10. Patient was given nitroglycerin paste.  Patient will be admitted by [facility] to telemetry.     Montine Circle, PA-C 07/18/14 Ay[place], MD 07/18/14 [phone]

## 2014-07-18 NOTE — ED Notes (Signed)
Attempted report 

## 2014-07-18 NOTE — ED Notes (Signed)
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 Patient was not exerting self at time or prior to chest pain. Patient endorses sudden onset of left eye vision loss for approximately 5 min and then resolved. Patient also had shortness of breath, dizziness and lightheadedness with CP, requiring him to sit down. Patient sts pain has been constant since then but pain decreased within 10 minutes. Patient sts pain has had intermittent flare ups since initial pain but has been consitently 6/10 chest pressure with stabbing pain. Pt sts had one more episode of vision loss in left eye for approximately 1 min when the chest pain got worse. Patient alert and oriented, skin warm and dry at present time. Patient denies weakness, facial droop, slurred speech, or syncope.

## 2014-07-18 NOTE — ED Notes (Signed)
Verbal card consult

## 2014-07-18 NOTE — H&P (Signed)
[facility] History and Physical  Liandro Thelin ZYS:[id number] DOB: 1970-09-26 DOA: 07/18/2014  Referring physician:  PCP: Drema Pry, DO   Chief Complaint: Chest pain  HPI: Scott Walter is a 44 y.o. male with a past medical history of hypertension, atrial fibrillation, presenting to the emergency department with complaints of chest pain. 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 He reports his chest pain occurred intermittently over the day having peak intensity of 6-8/10. He denies associated nausea, vomiting, cough, fevers, chills. During this episode he also reported having l loss of vision involving his left eye that lasted several minutes and occurred twice this morning. He has a history of atrial fibrillation and is not on anticoagulation. He reports taking 81 mg of aspirin daily. Workup in the emergency room included a troponin less than 0.3 and EKG did not reveal acute ischemic changes.                                                                   Review of Systems:  Constitutional:  No weight loss, night sweats, Fevers, chills, fatigue.  HEENT:  No headaches, Difficulty swallowing,Tooth/dental problems,Sore throat,  No sneezing, itching, ear ache, nasal congestion, post nasal drip,  Cardio-vascular:  Positive for chest pain, Orthopnea, PND, swelling in lower extremities, anasarca, dizziness, palpitations  GI:  No heartburn, indigestion, abdominal pain, nausea, vomiting, diarrhea, change in bowel habits, loss of appetite  Resp:  Positive for shortness of breath. No excess mucus, no productive cough, No non-productive cough, No coughing up of blood.No change in color of mucus.No wheezing.No chest wall deformity  Skin:  no rash or lesions.  GU:  no dysuria, change  in color of urine, no urgency or frequency. No flank pain.  Musculoskeletal:  No joint pain or swelling. No decreased range of motion. No back pain.  Psych:  No change in mood or affect. No depression or anxiety. No memory loss.   Past Medical History  Diagnosis Date  . Hypertension   . PAF (paroxysmal atrial fibrillation)     a. Dx 05/2012.  [name] ETOH abuse   . Bipolar disorder     h/o SI  . Depression   . Heart murmur     "when I was a baby" (01/26/2013)  . Anginal pain     "last couple days" (01/26/2013)  . Shortness of breath     "can come up at anytime lately" (01/26/2013)  . History of stomach ulcers ~ 2009  . XNATFTDD(220.2)     "monthly" (01/26/2013)  . Migraine     "maybe 2 in my lifetime" (01/26/2013)  . Chronic lower back pain   . Anxiety   . Chronic insomnia   . PTSD (post-traumatic stress disorder)    Past Surgical History  Procedure Laterality Date  . Knee arthroscopy Right 09/2011   Social History:  reports that he quit smoking about 2 years ago. His smoking use included Cigarettes. He has a 30 pack-year smoking history. He has quit using smokeless tobacco. His smokeless tobacco use included Chew. He reports that he drinks alcohol. He  reports that he does not use illicit drugs.  Allergies  Allergen Reactions  . Depakote [Divalproex Sodium] Anaphylaxis and Shortness Of Breath  . Ambien [Zolpidem Tartrate] Other (See Comments)    "Nightmares and mood swings    Family History  Problem Relation Age of Onset  . CAD Mother   . Diabetes Mother     Prior to Admission medications   Medication Sig Start Date End Date Taking? Authorizing Provider  aspirin EC 81 MG tablet Take 1 tablet (81 mg total) by mouth daily. 07/02/12  Yes Fay Records, MD  fexofenadine (ALLEGRA) 180 MG tablet Take 180 mg by mouth daily.   Yes Historical Provider, MD  metoprolol succinate (TOPROL-XL) 50 MG 24 hr tablet Take 1 tablet (50 mg total) by mouth daily. Take with or immediately  following a meal. 10/10/13  Yes Fay Records, MD  naphazoline-glycerin (CLEAR EYES) 0.012-0.2 % SOLN Place 1-2 drops into both eyes every 4 (four) hours as needed for irritation.   Yes Historical Provider, MD  HYDROcodone-acetaminophen (NORCO) 5-325 MG per tablet Take 1 tablet by mouth every 4 (four) hours as needed. 03/22/14   Daleen Bo, MD  methocarbamol (ROBAXIN) 500 MG tablet Take 1 tablet (500 mg total) by mouth every 8 (eight) hours as needed for muscle spasms. Patient not taking: Reported on 03/22/2014 03/30/13   Eulas Post, MD  metoprolol (LOPRESSOR) 50 MG tablet Take 50 mg by mouth 2 (two) times daily.    Historical Provider, MD   Physical Exam: Filed Vitals:   07/18/14 1545 07/18/14 1600 07/18/14 1615 07/18/14 1630  BP: 115/65 122/84 101/70 113/69  Pulse: 65 75 81 64  Temp:      TempSrc:      Resp: 14 17 20 12   Height:      Weight:      SpO2: 98% 98% 98% 97%    Wt Readings from Last 3 Encounters:  07/18/14 115.667 kg (255 lb)  03/08/14 109.77 kg (242 lb)  11/10/13 103.647 kg (228 lb 8 oz)    General:  Appears calm and comfortable, currently denies chest pain or shortness of breath.  Eyes: PERRL, normal lids, irises & conjunctiva ENT: grossly normal hearing, lips & tongue Neck: no LAD, masses or thyromegaly Cardiovascular: RRR, no m/r/g. No LE edema. Telemetry: SR, no arrhythmias  Respiratory: CTA bilaterally, no w/r/r. Normal respiratory effort. Abdomen: soft, ntnd Skin: no rash or induration seen on limited exam Musculoskeletal: grossly normal tone BUE/BLE Psychiatric: grossly normal mood and affect, speech fluent and appropriate Neurologic: grossly non-focal.          Labs on Admission:  Basic Metabolic Panel:  Recent Labs Lab 07/18/14 1130 07/18/14 1338  NA 138 140  K 3.8 4.3  CL 107 106  CO2 19*  --   GLUCOSE 124* 108*  BUN 9 10  CREATININE 0.77 0.70  CALCIUM 8.8*  --    Liver Function Tests:  Recent Labs Lab 07/18/14 1130  AST 29  ALT  27  ALKPHOS 68  BILITOT 1.3*  PROT 7.4  ALBUMIN 4.0   No results for input(s): LIPASE, AMYLASE in the last 168 hours. No results for input(s): AMMONIA in the last 168 hours. CBC:  Recent Labs Lab 07/18/14 1130 07/18/14 1338  WBC 5.1  --   NEUTROABS 2.7  --   HGB 14.7 16.0  HCT 42.1 47.0  MCV 87.2  --   PLT 181  --    Cardiac Enzymes: No results for  input(s): CKTOTAL, CKMB, CKMBINDEX, TROPONINI in the last 168 hours.  BNP (last 3 results) No results for input(s): BNP in the last 8760 hours.  ProBNP (last 3 results) No results for input(s): PROBNP in the last 8760 hours.  CBG: No results for input(s): GLUCAP in the last 168 hours.  Radiological Exams on Admission: Dg Chest 2 View  07/18/2014   CLINICAL DATA:  Chest pain, shortness of breath.  EXAM: CHEST  2 VIEW  COMPARISON:  January 26, 2013.  FINDINGS: The heart size and mediastinal contours are within normal limits. Both lungs are clear. No pneumothorax or pleural effusion is noted. The visualized skeletal structures are unremarkable.  IMPRESSION: No active cardiopulmonary disease.   Electronically Signed   By: Marijo Conception, M.D.   On: 07/18/2014 12:33   Ct Head Wo Contrast  07/18/2014   CLINICAL DATA:  Sudden onset of left-sided vision loss. Dizziness and lightheadedness.  EXAM: CT HEAD WITHOUT CONTRAST  TECHNIQUE: Contiguous axial images were obtained from the base of the skull through the vertex without intravenous contrast.  COMPARISON:  None.  FINDINGS: Mild age advanced atrophy with sulcal prominence and prominence of the bifrontal extra-axial spaces. The gray-white differentiation is maintained without CT evidence of acute large territory infarct. No intraparenchymal extra-axial mass or hemorrhage. Normal size and configuration of the ventricles and basilar cisterns. No midline shift. There is underpneumatization of the right frontal sinus. The remaining paranasal sinuses and mastoid air cells are normally aerated.  No air-fluid levels. Regional soft tissues appear normal. No displaced calvarial fracture.  IMPRESSION: Mild age advanced atrophy without acute intracranial process.   Electronically Signed   By: Sandi Mariscal M.D.   On: 07/18/2014 14:27    EKG: Independently reviewed.   Assessment/Plan Principal Problem:   Chest pain Active Problems:   Visual disturbance   HTN (hypertension)   ETOH abuse   Atrial fibrillation   1. Chest pain. Patient having a history of hypertension presenting with complaints of intermittent chest pain throughout the day having typical and atypical features. Workup in the emergency department unremarkable. Cardiology has been consulted. Will place patient overnight observation, cycle troponins, obtain fasting lipid panel along with repeat EKG in a.m. Patient to have transthoracic echocardiogram as part of TIA workup. Await further recommendations from cardiology. 2. Possible TIA. During chest pain episode this morning he reported visual loss to his left eye that lasted several minutes. He has a history of A. fib and had not been on anticoagulation. CT scan of brain did not reveal acute intracranial changes. Will further workup with MRI/MRA of brain, transthoracic echocardiogram, carotid Dopplers. Will increase his aspirin dose to 325 mg by mouth daily. Follow TIA protocol. 3. History of atrial fibrillation. Patient in sinus rhythm in the emergency department. Will continue metoprolol 50 mg by mouth twice a day 4. Hypertension. Blood pressure stable, continue metoprolol 5.   DVT prophylaxis. Lovenox   Code Status: Full code Family Communication: Spoke to his wife was present at bedside Disposition Plan: Will place patient in 24 hour observation  Time spent: 55 minutes  Kelvin Cellar [facility] Pager [phone]

## 2014-07-18 NOTE — ED Notes (Signed)
Pt reports sudden onset of diffuse chest pain this morning. Describes as pressure sensation. 5/10 pain upon arrival. Also states SOB. Respirations unlabored. Pt is alert and oriented x4. History of atrial fibrillation.

## 2014-07-18 NOTE — ED Notes (Signed)
Rob PA updating patient on results at bedside.

## 2014-07-18 NOTE — ED Provider Notes (Signed)
Patient complained of anterior chest pain onset 8:30 AM today while at work. Described as pressure with numbness in his right arm he also developed 2 episodes of blindness in his left eye first episode lasting 5 minutes second episode occurred 2 hours later lasted one or 2 minutes. Presently vision is normal. Discomfort in his chest is rated 2 on a scale of 1-10 after treatment with intravenous morphine and aspirin. On exam no distress lungs clear auscultation heart regular rate and rhythm other than nondistended nontender neurologic Glasgow Coma Score 15 cranial nerves II through XII grossly intact moves all extremity as well.  Orlie Dakin, MD 07/18/14 [phone]

## 2014-07-19 ENCOUNTER — Ambulatory Visit (HOSPITAL_COMMUNITY): Payer: Federal, State, Local not specified - PPO

## 2014-07-19 ENCOUNTER — Encounter (HOSPITAL_COMMUNITY): Payer: Federal, State, Local not specified - PPO

## 2014-07-19 ENCOUNTER — Inpatient Hospital Stay (HOSPITAL_COMMUNITY): Payer: Federal, State, Local not specified - PPO

## 2014-07-19 DIAGNOSIS — R079 Chest pain, unspecified: Secondary | ICD-10-CM | POA: Diagnosis not present

## 2014-07-19 DIAGNOSIS — I480 Paroxysmal atrial fibrillation: Secondary | ICD-10-CM | POA: Diagnosis not present

## 2014-07-19 DIAGNOSIS — G459 Transient cerebral ischemic attack, unspecified: Secondary | ICD-10-CM

## 2014-07-19 DIAGNOSIS — R42 Dizziness and giddiness: Secondary | ICD-10-CM | POA: Diagnosis not present

## 2014-07-19 DIAGNOSIS — F1010 Alcohol abuse, uncomplicated: Secondary | ICD-10-CM

## 2014-07-19 DIAGNOSIS — G453 Amaurosis fugax: Secondary | ICD-10-CM | POA: Diagnosis not present

## 2014-07-19 DIAGNOSIS — I10 Essential (primary) hypertension: Secondary | ICD-10-CM | POA: Diagnosis not present

## 2014-07-19 LAB — RAPID URINE DRUG SCREEN, HOSP PERFORMED
AMPHETAMINES: NOT DETECTED
Barbiturates: NOT DETECTED
Benzodiazepines: NOT DETECTED
Cocaine: NOT DETECTED
Opiates: POSITIVE — AB
Tetrahydrocannabinol: NOT DETECTED

## 2014-07-19 LAB — LIPID PANEL
CHOL/HDL RATIO: 4.5 ratio
Cholesterol: 178 mg/dL (ref 0–200)
HDL: 40 mg/dL — ABNORMAL LOW (ref >40)
LDL Cholesterol: 98 mg/dL (ref 0–99)
Triglycerides: 202 mg/dL — ABNORMAL HIGH (ref <150)
VLDL: 40 mg/dL (ref 0–40)

## 2014-07-19 LAB — NM MYOCAR MULTI W/SPECT W/WALL MOTION / EF
CHL CUP NUCLEAR SRS: 2
LHR: 0.27
LV sys vol: 60 mL
LVDIAVOL: 121 mL
NUC STRESS TID: 0.98
Nuc Stress EF: 50 %
SDS: 1
SSS: 3

## 2014-07-19 LAB — TROPONIN I

## 2014-07-19 MED ORDER — REGADENOSON 0.4 MG/5ML IV SOLN
INTRAVENOUS | Status: AC
  Filled 2014-07-19: qty 5

## 2014-07-19 MED ORDER — RIVAROXABAN 20 MG PO TABS
20.0000 mg | ORAL_TABLET | Freq: Every day | ORAL | Status: DC
  Administered 2014-07-19 – 2014-07-20 (×2): 20 mg via ORAL
  Filled 2014-07-19 (×2): qty 1

## 2014-07-19 MED ORDER — ATORVASTATIN CALCIUM 40 MG PO TABS
40.0000 mg | ORAL_TABLET | Freq: Every day | ORAL | Status: DC
  Administered 2014-07-19 – 2014-07-20 (×2): 40 mg via ORAL
  Filled 2014-07-19 (×2): qty 1

## 2014-07-19 MED ORDER — ASPIRIN EC 81 MG PO TBEC: 81.0000 mg | DELAYED_RELEASE_TABLET | Freq: Every day | ORAL | Status: DC

## 2014-07-19 MED ORDER — PERFLUTREN LIPID MICROSPHERE
1.0000 mL | INTRAVENOUS | Status: AC | PRN
  Administered 2014-07-19: 2 mL via INTRAVENOUS
  Filled 2014-07-19: qty 10

## 2014-07-19 MED ORDER — REGADENOSON 0.4 MG/5ML IV SOLN
0.4000 mg | Freq: Once | INTRAVENOUS | Status: AC
  Administered 2014-07-19: 0.4 mg via INTRAVENOUS

## 2014-07-19 MED ORDER — TECHNETIUM TC 99M SESTAMIBI - CARDIOLITE
30.0000 | Freq: Once | INTRAVENOUS | Status: AC | PRN
  Administered 2014-07-19: 30 via INTRAVENOUS

## 2014-07-19 MED ORDER — TECHNETIUM TC 99M SESTAMIBI GENERIC - CARDIOLITE
10.0000 | Freq: Once | INTRAVENOUS | Status: AC | PRN
  Administered 2014-07-19: 10 via INTRAVENOUS

## 2014-07-19 NOTE — Progress Notes (Signed)
Patient Profile: 44 y/o male with h/o PAF on daily ASA, admitted for chest pain and TIA symptoms.   Subjective: Notes occasional dizziness. Denies any recurrent CP or amaurosis fugax.  Objective: Vital signs in last 24 hours: Temp:  [97.9 F (36.6 C)-98.2 F (36.8 C)] 98 F (36.7 C) (06/01 0542) Pulse Rate:  [57-99] 99 (06/01 1153) Resp:  [12-20] 18 (06/01 1157) BP: (101-149)/(65-106) 149/79 mmHg (06/01 1157) SpO2:  [95 %-100 %] 100 % (06/01 0542) Weight:  [248 lb 14.4 oz (112.9 kg)] 248 lb 14.4 oz (112.9 kg) (05/31 1745)    Intake/Output from previous day: 05/31 0701 - 06/01 0700 In: 240 [P.O.:240] Out: 651 [Urine:650; Stool:1] Intake/Output this shift:    Medications Current Facility-Administered Medications  Medication Dose Route Frequency Provider Last Rate Last Dose  . acetaminophen (TYLENOL) tablet 650 mg  650 mg Oral Q4H PRN Kelvin Cellar, MD      . aspirin tablet 325 mg  325 mg Oral Daily Kelvin Cellar, MD   325 mg at 07/18/14 1812  . enoxaparin (LOVENOX) injection 40 mg  40 mg Subcutaneous Q24H Kelvin Cellar, MD   40 mg at 07/18/14 1900  . gi cocktail (Maalox,Lidocaine,Donnatal)  30 mL Oral QID PRN Kelvin Cellar, MD      . metoprolol succinate (TOPROL-XL) 24 hr tablet 50 mg  50 mg Oral QPC breakfast Kelvin Cellar, MD      . morphine 2 MG/ML injection 2 mg  2 mg Intravenous Q2H PRN Kelvin Cellar, MD      . ondansetron (ZOFRAN) injection 4 mg  4 mg Intravenous Q6H PRN Kelvin Cellar, MD      . regadenoson (LEXISCAN) 0.4 MG/5ML injection SOLN           . technetium sestamibi (CARDIOLITE) injection 30 milli Curie  30 milli Curie Intravenous Once PRN Medication Radiologist, MD        PE: General appearance: alert, cooperative and no distress Neck: no carotid bruit and no JVD Lungs: clear to auscultation bilaterally Heart: regular rate and rhythm, S1, S2 normal, no murmur, click, rub or gallop Extremities: no LEE Pulses: 2+ and symmetric Skin: warm  and dry Neurologic: Grossly normal  Lab Results:   Recent Labs  07/18/14 1130 07/18/14 1338 07/18/14 1825  WBC 5.1  --  6.1  HGB 14.7 16.0 14.1  HCT 42.1 47.0 40.1  PLT 181  --  180   BMET  Recent Labs  07/18/14 1130 07/18/14 1338 07/18/14 1825  NA 138 140  --   K 3.8 4.3  --   CL 107 106  --   CO2 19*  --   --   GLUCOSE 124* 108*  --   BUN 9 10  --   CREATININE 0.77 0.70 0.74  CALCIUM 8.8*  --   --    PT/INR  Recent Labs  07/18/14 1330  LABPROT 14.3  INR 1.09   Cholesterol  Recent Labs  07/18/14 2340  CHOL 178   Cardiac Panel (last 3 results)  Recent Labs  07/18/14 1825 07/18/14 2123 07/18/14 2340  TROPONINI <0.03 <0.03 <0.03    Studies/Results: 2D Echo- pending  NST- results pending   Assessment/Plan    Active Problems:   Essential hypertension   ETOH abuse   Visual disturbance   TIA (transient ischemic attack)   Paroxysmal atrial fibrillation   Chest pain with moderate risk for cardiac etiology   Amaurosis fugax of right eye   1. Chest Pain with Moderate  Risk for Cardiac Etiology: ? If chest pressure from possible recurrent atrial fibrillation vs CAD. Patient noted sudden onset of resting substernal chest pressure. Symptoms relieved with nitro patch. EKG nonacute. Troponins negative x 3. 2D echo pending. NST to rule-out ischemia completed. No EKG changes during stress portion. Radiologist interpretation pending.  2. TIA: no further amaurosis fugax. CT negative. In setting of PAF. CHA2DS2 VASc score now increased to 3 (TIA + HTN). Will need to transition to NOAC prior to discharge for stoke prophylaxis. Consider Xarelto.   3. HTN: Moderately elevated this am but BB was held for stress test. Ok to give metoprolol now. Continue to monitor.   4. PAF: currently in NSR. Continue on telemetry and continue metoprolol. With TIA, his CHA2DS2 VASc score is now 3 (+ HTN). Will need to start on NOAC prior to discharge. Will consider changing  Flecainide from PRN to scheduled to maintain NSR.    LOS: 1 day    Scott Walter Scott Walter 07/19/2014 12:01 PM  Patient seen, examined. Available data reviewed. Agree with findings, assessment, and plan as outlined by Lyda Jester, PA-C. Exam reveals healthy appearing young man in NAD. Lungs CTA, heart RRR without murmur, no peripheral edema. States vision loss has completely resolved. In setting of PAF, he has compelling TIA symptoms. MRI was negative. I agree that anticoagulation is indicated and a DOAC is certainly reasonable in this young patient. He should stop aspirin. We discussed consideration of scheduled flecainide versus continued 'pill-in-pocket' approach. He favors the latter and does not wish to take daily antiarrhythmic drugs. Await Myoview results from today. If negative, he can be discharged from my perspective unless further neurologic evaluation planned.  Sherren Mocha, M.D. 07/19/2014 2:58 PM

## 2014-07-19 NOTE — Progress Notes (Addendum)
  Echocardiogram 2D Echocardiogram with Definity has been performed.  Diamond Nickel 07/19/2014, 3:39 PM

## 2014-07-19 NOTE — Care Management Note (Signed)
Case Management Note  Patient Details  Name: Shammond Arave MRN: [id number] Date of Birth: [birth year]  Subjective/Objective:    Pt admitted with essential hypertension, chest pain and TIA symptoms       Action/Plan:  Pt to discharge on Xeralto.  CM will provide assistance per insurance parameters.  CM will continue to monitor for pt disposition   Expected Discharge Date:                  Expected Discharge Plan:  Home/Self Care  In-House Referral:     Discharge planning Services  CM Consult, Medication Assistance ([place])  Post Acute Care Choice:    Choice offered to:     DME Arranged:    DME Agency:     HH Arranged:    HH Agency:     Status of Service:  In process, will continue to follow  Medicare Important Message Given:  No Date Medicare IM Given:    Medicare IM give by:    Date Additional Medicare IM Given:    Additional Medicare Important Message give by:     If discussed at [place] of Stay Meetings, dates discussed:    Additional Comments: 07/19/14 Elenor Quinones, RN, BSN [phone].  CM requested benefit check for Xeralto.  CM provided pt both cards (free 30 day and $0 Copay card).  CM instructed pt to provide both free 30 day card with prescription, and that he could use the $ 0 monthly copay card post the first month.  CM contacted pharmacy for initial [place] however was informed that it was specific to DVT/PE diagnosis.  Pt and significant other voiced concerns regarding possible recalls with medication, CM informed pt that specific information regarding medication should come from MD.  CM consulted with Bedside Nurse Raquel Sarna and requested MD be contacted and concerns be relayed.  Bedside nurse will follow up.  CM contacted pharmacy of choice ([facility] on [address] in [place]) and was informed that prescription is ready for fill.  CM will provide blue cross blue shield required copay and if prior authorization is required once received from  benefit check. Maryclare Labrador, RN 07/19/2014, 4:23 PM

## 2014-07-19 NOTE — Progress Notes (Signed)
PROGRESS NOTE  Scott Walter DTO:[id number] DOB: [birth year] DOA: 07/18/2014 PCP: Drema Pry, DO  HPI: Scott Walter is a 44 y.o. male with a past medical history of hypertension, atrial fibrillation, presenting to the emergency department with complaints of chest pain  Subjective / 24 H Interval events - "slight" chest pain this morning - no shortness of breath, no GI symptoms  Assessment/Plan: Active Problems:   Essential hypertension   ETOH abuse   Visual disturbance   TIA (transient ischemic attack)   Paroxysmal atrial fibrillation   Chest pain with moderate risk for cardiac etiology   Amaurosis fugax of right eye   Chest pain  - Patient having a history of hypertension presenting with complaints of intermittent chest pain throughout the day having typical and atypical features.  - Workup in the emergency department unremarkable.  - Cardiology has been consulted, patient to undergo stress test today   TIA.  - During chest pain episode this morning he reported visual loss to his left eye that lasted several minutes. - given A fib, will start Xarelto  - MRI negative - start statin  HLD - LDL 98 - start statin  History of atrial fibrillation.  - Patient in sinus rhythm in the emergency department.  - Will continue metoprolol 50 mg by mouth twice a day  Hypertension.  - Blood pressure stable, continue metoprolol   Diet: Diet Heart Room service appropriate?: Yes; Fluid consistency:: Thin Fluids: none  DVT Prophylaxis: xarelto  Code Status: Full Code Family Communication: none  Disposition Plan: home when ready   Consultants:  Cardiology   Procedures:  Stress test 6/1  2D echo   Antibiotics  Anti-infectives    None       Studies  Dg Chest 2 View  07/18/2014   CLINICAL DATA:  Chest pain, shortness of breath.  EXAM: CHEST  2 VIEW  COMPARISON:  January 26, 2013.  FINDINGS: The heart size and mediastinal contours are within normal limits. Both lungs  are clear. No pneumothorax or pleural effusion is noted. The visualized skeletal structures are unremarkable.  IMPRESSION: No active cardiopulmonary disease.   Electronically Signed   By: Marijo Conception, M.D.   On: 07/18/2014 12:33   Ct Head Wo Contrast  07/18/2014   CLINICAL DATA:  Sudden onset of left-sided vision loss. Dizziness and lightheadedness.  EXAM: CT HEAD WITHOUT CONTRAST  TECHNIQUE: Contiguous axial images were obtained from the base of the skull through the vertex without intravenous contrast.  COMPARISON:  None.  FINDINGS: Mild age advanced atrophy with sulcal prominence and prominence of the bifrontal extra-axial spaces. The gray-white differentiation is maintained without CT evidence of acute large territory infarct. No intraparenchymal extra-axial mass or hemorrhage. Normal size and configuration of the ventricles and basilar cisterns. No midline shift. There is underpneumatization of the right frontal sinus. The remaining paranasal sinuses and mastoid air cells are normally aerated. No air-fluid levels. Regional soft tissues appear normal. No displaced calvarial fracture.  IMPRESSION: Mild age advanced atrophy without acute intracranial process.   Electronically Signed   By: Sandi Mariscal M.D.   On: 07/18/2014 14:27   Mri Brain Without Contrast  07/18/2014   CLINICAL DATA:  Episode of substernal chest pain this morning with loss of vision in the left eye lasting several min in occurring tori. History atrial fibrillation not on anticoagulation.  EXAM: MRI HEAD WITHOUT CONTRAST  MRA HEAD WITHOUT CONTRAST  TECHNIQUE: Multiplanar, multiecho pulse sequences of the brain and surrounding structures  were obtained without intravenous contrast. Angiographic images of the head were obtained using MRA technique without contrast.  COMPARISON:  Head CT earlier today  FINDINGS: MRI HEAD FINDINGS  There is no evidence of acute infarct, intracranial hemorrhage, mass, midline shift, or extra-axial fluid  collection. Ventricles and sulci are upper limits of normal to minimally prominent in size for age, suggestive of very mild cerebral atrophy.  Orbits are unremarkable. Paranasal sinuses and mastoid air cells are clear. Major intracranial vascular flow voids are preserved.  MRA HEAD FINDINGS  The visualized distal vertebral arteries are patent with the left being minimally larger than the right. AICA and SCA origins are patent. Basilar artery is patent without stenosis. Focally diminished signal in the mid basilar artery is artifactual. There are small, patent posterior communicating arteries bilaterally. PCAs are unremarkable.  The visualized distal cervical ICA on the left is tortuous. Intracranial ICAs are patent bilaterally without stenosis. Anterior communicating artery is patent. A median artery of the corpus callosum is incidentally noted. ACAs and MCAs are otherwise unremarkable. No intracranial aneurysm is identified.  IMPRESSION: 1. No acute intracranial abnormality. 2. Very mild cerebral atrophy, otherwise unremarkable appearance of the brain. 3. Negative head MRA.   Electronically Signed   By: Logan Bores   On: 07/18/2014 21:30   Mr [name] Head/brain Wo Cm  07/18/2014   CLINICAL DATA:  Episode of substernal chest pain this morning with loss of vision in the left eye lasting several min in occurring tori. History atrial fibrillation not on anticoagulation.  EXAM: MRI HEAD WITHOUT CONTRAST  MRA HEAD WITHOUT CONTRAST  TECHNIQUE: Multiplanar, multiecho pulse sequences of the brain and surrounding structures were obtained without intravenous contrast. Angiographic images of the head were obtained using MRA technique without contrast.  COMPARISON:  Head CT earlier today  FINDINGS: MRI HEAD FINDINGS  There is no evidence of acute infarct, intracranial hemorrhage, mass, midline shift, or extra-axial fluid collection. Ventricles and sulci are upper limits of normal to minimally prominent in size for age,  suggestive of very mild cerebral atrophy.  Orbits are unremarkable. Paranasal sinuses and mastoid air cells are clear. Major intracranial vascular flow voids are preserved.  MRA HEAD FINDINGS  The visualized distal vertebral arteries are patent with the left being minimally larger than the right. AICA and SCA origins are patent. Basilar artery is patent without stenosis. Focally diminished signal in the mid basilar artery is artifactual. There are small, patent posterior communicating arteries bilaterally. PCAs are unremarkable.  The visualized distal cervical ICA on the left is tortuous. Intracranial ICAs are patent bilaterally without stenosis. Anterior communicating artery is patent. A median artery of the corpus callosum is incidentally noted. ACAs and MCAs are otherwise unremarkable. No intracranial aneurysm is identified.  IMPRESSION: 1. No acute intracranial abnormality. 2. Very mild cerebral atrophy, otherwise unremarkable appearance of the brain. 3. Negative head MRA.   Electronically Signed   By: Logan Bores   On: 07/18/2014 21:30    Objective  Filed Vitals:   07/19/14 1153 07/19/14 1155 07/19/14 1157 07/19/14 1340  BP: 134/106 144/85 149/79 138/94  Pulse: 99   65  Temp:    98.2 F (36.8 C)  TempSrc:    Oral  Resp: 20 20 18 20   Height:      Weight:      SpO2:    100%    Intake/Output Summary (Last 24 hours) at 07/19/14 1510 Last data filed at 07/19/14 0544  Gross per 24 hour  Intake  240 ml  Output    651 ml  Net   -411 ml   Filed Weights   07/18/14 1151 07/18/14 1745  Weight: 115.667 kg (255 lb) 112.9 kg (248 lb 14.4 oz)    Exam:  General:  NAD  HEENT: no scleral icterus, PERRL  Cardiovascular: RRR without MRG, 2+ peripheral pulses, no edema  Respiratory: CTA biL, good air movement, no wheezing, no crackles, no rales  Abdomen: soft, non tender, BS +, no guarding  MSK/Extremities: no clubbing/cyanosis, no joint swelling  Skin: no rashes  Neuro: non focal,  strength 5/5 in all 4   Data Reviewed: Basic Metabolic Panel:  Recent Labs Lab 07/18/14 1130 07/18/14 1338 07/18/14 1825  NA 138 140  --   K 3.8 4.3  --   CL 107 106  --   CO2 19*  --   --   GLUCOSE 124* 108*  --   BUN 9 10  --   CREATININE 0.77 0.70 0.74  CALCIUM 8.8*  --   --    Liver Function Tests:  Recent Labs Lab 07/18/14 1130  AST 29  ALT 27  ALKPHOS 68  BILITOT 1.3*  PROT 7.4  ALBUMIN 4.0   CBC:  Recent Labs Lab 07/18/14 1130 07/18/14 1338 07/18/14 1825  WBC 5.1  --  6.1  NEUTROABS 2.7  --   --   HGB 14.7 16.0 14.1  HCT 42.1 47.0 40.1  MCV 87.2  --  87.2  PLT 181  --  180   Cardiac Enzymes:  Recent Labs Lab 07/18/14 1825 07/18/14 2123 07/18/14 2340  TROPONINI <0.03 <0.03 <0.03    Scheduled Meds: . aspirin  325 mg Oral Daily  . enoxaparin (LOVENOX) injection  40 mg Subcutaneous Q24H  . metoprolol succinate  50 mg Oral QPC breakfast  . regadenoson       Continuous Infusions:   Time spent: 25 minutes  Marzetta Board, MD [facility] Pager [phone]. If 7 PM - 7 AM, please contact night-coverage at [email or web address], password [facility] 07/19/2014, 3:10 PM  LOS: 1 day

## 2014-07-19 NOTE — Progress Notes (Signed)
VASCULAR LAB PRELIMINARY  PRELIMINARY  PRELIMINARY  PRELIMINARY  Carotid duplex  completed.    Preliminary report:  Bilateral:  1-39% ICA stenosis.  Vertebral artery flow is antegrade.      Anvita Hirata, RVT 07/19/2014, 2:32 PM

## 2014-07-19 NOTE — Progress Notes (Deleted)
Pt c/o roof of mouth soreness (pt states it may be from when he was intubated), small area of roof of mouth is red and irritated. Pt has dentures in which are covering the area, encouraged pt to leave top dentures out for a while to allow roof of mouth to be without anything against it.  Will have pt rinse mouth with saline solution.   MD please advise if needs further intervention.

## 2014-07-19 NOTE — Progress Notes (Signed)
ANTICOAGULATION CONSULT NOTE - Initial Consult  Pharmacy Consult for xarelto Indication: atrial fibrillation  Allergies  Allergen Reactions  . Depakote [Divalproex Sodium] Anaphylaxis and Shortness Of Breath  . Ambien [Zolpidem Tartrate] Other (See Comments)    "Nightmares and mood swings    Patient Measurements: Height: 5\' 9"  (175.3 cm) Weight: 248 lb 14.4 oz (112.9 kg) IBW/kg (Calculated) : 70.7   Vital Signs: Temp: 98.2 F (36.8 C) (06/01 1340) Temp Source: Oral (06/01 1340) BP: 138/94 mmHg (06/01 1340) Pulse Rate: 65 (06/01 1340)  Labs:  Recent Labs  07/18/14 1130 07/18/14 1330 07/18/14 1338 07/18/14 1825 07/18/14 2123 07/18/14 2340  HGB 14.7  --  16.0 14.1  --   --   HCT 42.1  --  47.0 40.1  --   --   PLT 181  --   --  180  --   --   APTT  --  30  --   --   --   --   LABPROT  --  14.3  --   --   --   --   INR  --  1.09  --   --   --   --   CREATININE 0.77  --  0.70 0.74  --   --   TROPONINI  --   --   --  <0.03 <0.03 <0.03    Estimated Creatinine Clearance: 147.5 mL/min (by C-G formula based on Cr of 0.74).   Medical History: Past Medical History  Diagnosis Date  . Hypertension   . PAF (paroxysmal atrial fibrillation)     a. Dx 05/2012.  [name] ETOH abuse   . Bipolar disorder     h/o SI  . Depression   . Heart murmur     "when I was a baby" (01/26/2013)  . Anginal pain     "last couple days" (01/26/2013)  . Shortness of breath     "can come up at anytime lately" (01/26/2013)  . History of stomach ulcers ~ 2009  . IRCVELFY(101.7)     "monthly" (01/26/2013)  . Migraine     "maybe 2 in my lifetime" (01/26/2013)  . Chronic lower back pain   . Anxiety   . Chronic insomnia   . PTSD (post-traumatic stress disorder)      Assessment: 43yom with Hx HTN, ETOH use admitted with CP - found to be in Afib CVR now SR on metoprolol  And TIA symptoms.  Brain MRA shows There is no evidence of acute infarct, intracranial hemorrhage.  CBC stable, renal function  stable.   Spoke to patient and family at length regarding the importance of compliance with medication specifically rivaroxaban.  He is interested in continueing ETOH use  - I discussed a decrease in the amount and he still wants to get tattoos.    Goal of Therapy:  Monitor platelets by anticoagulation protocol: Yes   Plan:  Decrease ASA 81mg  daily Add atorvastatin 40mg  daily - for stroke core measures Rivaroxaban 20mg  daily  Lots of education completed to patient and family Monitor for bleeding  Bonnita Nasuti Pharm.D. CPP, BCPS Clinical Pharmacist [phone] 07/19/2014 5:09 PM

## 2014-07-20 DIAGNOSIS — R079 Chest pain, unspecified: Secondary | ICD-10-CM | POA: Diagnosis not present

## 2014-07-20 DIAGNOSIS — I10 Essential (primary) hypertension: Secondary | ICD-10-CM | POA: Diagnosis not present

## 2014-07-20 DIAGNOSIS — F1010 Alcohol abuse, uncomplicated: Secondary | ICD-10-CM | POA: Diagnosis not present

## 2014-07-20 DIAGNOSIS — G453 Amaurosis fugax: Secondary | ICD-10-CM | POA: Diagnosis not present

## 2014-07-20 LAB — HEMOGLOBIN A1C
Hgb A1c MFr Bld: 6 % — ABNORMAL HIGH (ref 4.8–5.6)
Mean Plasma Glucose: 126 mg/dL

## 2014-07-20 MED ORDER — RIVAROXABAN 20 MG PO TABS: 20.0000 mg | ORAL_TABLET | Freq: Every day | ORAL | Status: DC

## 2014-07-20 MED ORDER — ATORVASTATIN CALCIUM 40 MG PO TABS: 40.0000 mg | ORAL_TABLET | Freq: Every day | ORAL | Status: DC

## 2014-07-20 NOTE — Discharge Instructions (Addendum)
Information on my medicine - XARELTO (Rivaroxaban)  This medication education was reviewed with me or my healthcare representative as part of my discharge preparation.  The pharmacist that spoke with me during my hospital stay was:  COOPER, Orpha Bur, [place]  Why was Xarelto prescribed for you? Xarelto was prescribed for you to reduce the risk of a blood clot forming that can cause a stroke if you have a medical condition called atrial fibrillation (a type of irregular heartbeat).  What do you need to know about xarelto ? Take your Xarelto ONCE DAILY at the same time every day with your evening meal. If you have difficulty swallowing the tablet whole, you may crush it and mix in applesauce just prior to taking your dose.  Take Xarelto exactly as prescribed by your doctor and DO NOT stop taking Xarelto without talking to the doctor who prescribed the medication.  Stopping without other stroke prevention medication to take the place of Xarelto may increase your risk of developing a clot that causes a stroke.  Refill your prescription before you run out.  After discharge, you should have regular check-up appointments with your healthcare provider that is prescribing your Xarelto.  In the future your dose may need to be changed if your kidney function or weight changes by a significant amount.  What do you do if you miss a dose? If you are taking Xarelto ONCE DAILY and you miss a dose, take it as soon as you remember on the same day then continue your regularly scheduled once daily regimen the next day. Do not take two doses of Xarelto at the same time or on the same day.   Important Safety Information A possible side effect of Xarelto is bleeding. You should call your healthcare provider right away if you experience any of the following: ? Bleeding from an injury or your nose that does not stop. ? Unusual colored urine (red or dark brown) or unusual colored stools (red or  black). ? Unusual bruising for unknown reasons. ? A serious fall or if you hit your head (even if there is no bleeding).  Some medicines may interact with Xarelto and might increase your risk of bleeding while on Xarelto. To help avoid this, consult your healthcare provider or pharmacist prior to using any new prescription or non-prescription medications, including herbals, vitamins, non-steroidal anti-inflammatory drugs (NSAIDs) and supplements.  This website has more information on Xarelto: [email or web address]  Follow with Drema Pry, DO in 1-2 weeks  Please get a complete blood count and chemistry panel checked by your Primary MD at your next visit, and again as instructed by your Primary MD. Please get your medications reviewed and adjusted by your Primary MD.  Please request your Primary MD to go over all Hospital Tests and Procedure/Radiological results at the follow up, please get all Hospital records sent to your Prim MD by signing hospital release before you go home.  If you had Pneumonia of Lung problems at the Hospital: Please get a 2 view Chest X ray done in 6-8 weeks after hospital discharge or sooner if instructed by your Primary MD.  If you have Congestive Heart Failure: Please call your Cardiologist or Primary MD anytime you have any of the following symptoms:  1) 3 pound weight gain in 24 hours or 5 pounds in 1 week  2) shortness of breath, with or without a dry hacking cough  3) swelling in the hands, feet or stomach  4) if you have to  sleep on extra pillows at night in order to breathe  Follow cardiac low salt diet and 1.5 lit/day fluid restriction.  If you have diabetes Accuchecks 4 times/day, Once in AM empty stomach and then before each meal. Log in all results and show them to your primary doctor at your next visit. If any glucose reading is under 80 or above 300 call your primary MD immediately.  If you have Seizure/Convulsions/Epilepsy: Please do not drive,  operate heavy machinery, participate in activities at heights or participate in high speed sports until you have seen by Primary MD or a Neurologist and advised to do so again.  If you had Gastrointestinal Bleeding: Please ask your Primary MD to check a complete blood count within one week of discharge or at your next visit. Your endoscopic/colonoscopic biopsies that are pending at the time of discharge, will also need to followed by your Primary MD.  Get Medicines reviewed and adjusted. Please take all your medications with you for your next visit with your Primary MD  Please request your Primary MD to go over all hospital tests and procedure/radiological results at the follow up, please ask your Primary MD to get all Hospital records sent to his/her office.  If you experience worsening of your admission symptoms, develop shortness of breath, life threatening emergency, suicidal or homicidal thoughts you must seek medical attention immediately by calling 911 or calling your MD immediately  if symptoms less severe.  You must read complete instructions/literature along with all the possible adverse reactions/side effects for all the Medicines you take and that have been prescribed to you. Take any new Medicines after you have completely understood and accpet all the possible adverse reactions/side effects.   Do not drive or operate heavy machinery when taking Pain medications.   Do not take more than prescribed Pain, Sleep and Anxiety Medications  Special Instructions: If you have smoked or chewed Tobacco  in the last 2 yrs please stop smoking, stop any regular Alcohol  and or any Recreational drug use.  Wear Seat belts while driving.  Please note You were cared for by a hospitalist during your hospital stay. If you have any questions about your discharge medications or the care you received while you were in the hospital after you are discharged, you can call the unit and asked to speak with  the hospitalist on call if the hospitalist that took care of you is not available. Once you are discharged, your primary care physician will handle any further medical issues. Please note that NO REFILLS for any discharge medications will be authorized once you are discharged, as it is imperative that you return to your primary care physician (or establish a relationship with a primary care physician if you do not have one) for your aftercare needs so that they can reassess your need for medications and monitor your lab values.  You can reach the hospitalist office at phone [phone] or fax [phone]   If you do not have a primary care physician, you can call [phone] for a physician referral.  Activity: As tolerated with Full fall precautions use walker/cane & assistance as needed  Diet: heart healthy  Disposition Home

## 2014-07-20 NOTE — Discharge Summary (Signed)
Physician Discharge Summary  Scott Walter FUX:[id number] DOB: Jul 04, 1970 DOA: 07/18/2014  PCP: Drema Pry, DO  Admit date: 07/18/2014 Discharge date: 07/20/2014  Time spent: > 30 minutes  Recommendations for Outpatient Follow-up:  1. Follow up with Dr. Shawna Orleans in 2 weeks 2. Follow up with Dr. [name] in 2 weeks  3. Started on Xarelto for A fib  Discharge Diagnoses:  Active Problems:   Essential hypertension   ETOH abuse   Visual disturbance   TIA (transient ischemic attack)   Paroxysmal atrial fibrillation   Chest pain with moderate risk for cardiac etiology   Amaurosis fugax of right eye  Discharge Condition: stable  Diet recommendation: heart healthy  Filed Weights   07/18/14 1151 07/18/14 1745  Weight: 115.667 kg (255 lb) 112.9 kg (248 lb 14.4 oz)   History of present illness:  Scott Walter is a 44 y.o. male with a past medical history of hypertension, atrial fibrillation, presenting to the emergency department with complaints of chest pain. 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 He reports his chest pain occurred intermittently over the day having peak intensity of 6-8/10. He denies associated nausea, vomiting, cough, fevers, chills. During this episode he also reported having l loss of vision involving his left eye that lasted several minutes and occurred twice this morning. He has a history of atrial fibrillation and is not on anticoagulation. He reports taking 81 mg of aspirin daily. Workup in the emergency room included a troponin less than 0.3 and EKG did not reveal acute ischemic changes.   Hospital Course:  Chest pain - Patient having a history of hypertension presenting with complaints of intermittent chest pain throughout the day having typical and atypical  features. Cardiology has been consulted and have followed patient while hospitalized. He underwent a stress test which was low risk (full result below) TIA. - During chest pain episode he reported visual loss to his left eye that lasted several minutes. He underwent an MRI of the brain which was negative for CVA. He underwent a 2D echo which was unremarkable.  HLD- LDL 98, started statin History of atrial fibrillation. - Patient in sinus rhythm currently, given HTN, TIA and possibly depressed EF seen on the stress test (not on 2D echo), CHA2DS2 VASc score is 3, he was started on Xarelto.  Hypertension. - Blood pressure stable, continue metoprolol   Procedures:  2D echo Study Conclusions  - Left ventricle: The cavity size was normal. Wall thickness wasincreased in a pattern of mild LVH. Systolic function was normal.The estimated ejection fraction was in the range of 60% to 65%.Wall motion was normal; there were no regional wall motionabnormalities.  Carotids  Preliminary report: Bilateral: 1-39% ICA stenosis. Vertebral artery flow is antegrade.   Consultations:  Cardiology   Discharge Exam: Filed Vitals:   07/19/14 1340 07/19/14 2200 07/20/14 0612 07/20/14 1000  BP: 138/94 131/78 128/77 142/83  Pulse: 65 75 62 66  Temp: 98.2 F (36.8 C) 98 F (36.7 C) 97.8 F (36.6 C)   TempSrc: Oral Oral Oral   Resp: 20 18 18    Height:      Weight:      SpO2: 100% 94% 94%     General: NAD Cardiovascular: RRR Respiratory: CTA biL  Discharge Instructions     Medication List    STOP  taking these medications        aspirin EC 81 MG tablet     metoprolol 50 MG tablet  Commonly known as:  LOPRESSOR      TAKE these medications        atorvastatin 40 MG tablet  Commonly known as:  LIPITOR  Take 1 tablet (40 mg total) by mouth daily at 6 PM.     fexofenadine 180 MG tablet  Commonly known as:  ALLEGRA  Take 180 mg by mouth daily.     HYDROcodone-acetaminophen 5-325 MG  per tablet  Commonly known as:  NORCO  Take 1 tablet by mouth every 4 (four) hours as needed.     methocarbamol 500 MG tablet  Commonly known as:  ROBAXIN  Take 1 tablet (500 mg total) by mouth every 8 (eight) hours as needed for muscle spasms.     metoprolol succinate 50 MG 24 hr tablet  Commonly known as:  TOPROL-XL  Take 1 tablet (50 mg total) by mouth daily. Take with or immediately following a meal.     naphazoline-glycerin 0.012-0.2 % Soln  Commonly known as:  CLEAR EYES  Place 1-2 drops into both eyes every 4 (four) hours as needed for irritation.     rivaroxaban 20 MG Tabs tablet  Commonly known as:  XARELTO  Take 1 tablet (20 mg total) by mouth daily with supper.           Follow-up Information    Follow up with Drema Pry, DO. Schedule an appointment as soon as possible for a visit in 2 weeks.   Specialty:  Internal Medicine   Contact information:   [place] [address] [phone]       Follow up with Dorris Carnes, MD. Schedule an appointment as soon as possible for a visit in 2 weeks.   Specialty:  Cardiology   Contact information:   [place] [address] [phone]       The results of significant diagnostics from this hospitalization (including imaging, microbiology, ancillary and laboratory) are listed below for reference.    Significant Diagnostic Studies: Dg Chest 2 View  07/18/2014   CLINICAL DATA:  Chest pain, shortness of breath.  EXAM: CHEST  2 VIEW  COMPARISON:  January 26, 2013.  FINDINGS: The heart size and mediastinal contours are within normal limits. Both lungs are clear. No pneumothorax or pleural effusion is noted. The visualized skeletal structures are unremarkable.  IMPRESSION: No active cardiopulmonary disease.   Electronically Signed   By: Marijo Conception, M.D.   On: 07/18/2014 12:33   Ct Head Wo Contrast  07/18/2014   CLINICAL DATA:  Sudden onset of left-sided vision loss.  Dizziness and lightheadedness.  EXAM: CT HEAD WITHOUT CONTRAST  TECHNIQUE: Contiguous axial images were obtained from the base of the skull through the vertex without intravenous contrast.  COMPARISON:  None.  FINDINGS: Mild age advanced atrophy with sulcal prominence and prominence of the bifrontal extra-axial spaces. The gray-white differentiation is maintained without CT evidence of acute large territory infarct. No intraparenchymal extra-axial mass or hemorrhage. Normal size and configuration of the ventricles and basilar cisterns. No midline shift. There is underpneumatization of the right frontal sinus. The remaining paranasal sinuses and mastoid air cells are normally aerated. No air-fluid levels. Regional soft tissues appear normal. No displaced calvarial fracture.  IMPRESSION: Mild age advanced atrophy without acute intracranial process.   Electronically Signed   By: Jenny Reichmann  Watts M.D.   On: 07/18/2014 14:27   Mri Brain Without Contrast  07/18/2014   CLINICAL DATA:  Episode of substernal chest pain this morning with loss of vision in the left eye lasting several min in occurring tori. History atrial fibrillation not on anticoagulation.  EXAM: MRI HEAD WITHOUT CONTRAST  MRA HEAD WITHOUT CONTRAST  TECHNIQUE: Multiplanar, multiecho pulse sequences of the brain and surrounding structures were obtained without intravenous contrast. Angiographic images of the head were obtained using MRA technique without contrast.  COMPARISON:  Head CT earlier today  FINDINGS: MRI HEAD FINDINGS  There is no evidence of acute infarct, intracranial hemorrhage, mass, midline shift, or extra-axial fluid collection. Ventricles and sulci are upper limits of normal to minimally prominent in size for age, suggestive of very mild cerebral atrophy.  Orbits are unremarkable. Paranasal sinuses and mastoid air cells are clear. Major intracranial vascular flow voids are preserved.  MRA HEAD FINDINGS  The visualized distal vertebral arteries  are patent with the left being minimally larger than the right. AICA and SCA origins are patent. Basilar artery is patent without stenosis. Focally diminished signal in the mid basilar artery is artifactual. There are small, patent posterior communicating arteries bilaterally. PCAs are unremarkable.  The visualized distal cervical ICA on the left is tortuous. Intracranial ICAs are patent bilaterally without stenosis. Anterior communicating artery is patent. A median artery of the corpus callosum is incidentally noted. ACAs and MCAs are otherwise unremarkable. No intracranial aneurysm is identified.  IMPRESSION: 1. No acute intracranial abnormality. 2. Very mild cerebral atrophy, otherwise unremarkable appearance of the brain. 3. Negative head MRA.   Electronically Signed   By: Logan Bores   On: 07/18/2014 21:30   Nm Myocar Multi W/spect W/wall Motion / Ef  07/19/2014    This is a low risk study.  The left ventricular ejection fraction is mildly decreased (50%).   Low risk pharmacological stress nuclear study with normal perfusion and  normal left ventricular regional motion and minimally decreased global  systolic function.    07/19/2014    This is a low risk study.  The left ventricular ejection fraction is mildly decreased (45-54%).   Low risk pharmacological stress nuclear study with normal perfusion and  normal left ventricular regional motion and minimally decreased global  systolic function.    Mr [name] Head/brain Wo Cm  07/18/2014   CLINICAL DATA:  Episode of substernal chest pain this morning with loss of vision in the left eye lasting several min in occurring tori. History atrial fibrillation not on anticoagulation.  EXAM: MRI HEAD WITHOUT CONTRAST  MRA HEAD WITHOUT CONTRAST  TECHNIQUE: Multiplanar, multiecho pulse sequences of the brain and surrounding structures were obtained without intravenous contrast. Angiographic images of the head were obtained using MRA technique without contrast.   COMPARISON:  Head CT earlier today  FINDINGS: MRI HEAD FINDINGS  There is no evidence of acute infarct, intracranial hemorrhage, mass, midline shift, or extra-axial fluid collection. Ventricles and sulci are upper limits of normal to minimally prominent in size for age, suggestive of very mild cerebral atrophy.  Orbits are unremarkable. Paranasal sinuses and mastoid air cells are clear. Major intracranial vascular flow voids are preserved.  MRA HEAD FINDINGS  The visualized distal vertebral arteries are patent with the left being minimally larger than the right. AICA and SCA origins are patent. Basilar artery is patent without stenosis. Focally diminished signal in the mid basilar artery is artifactual. There are small, patent posterior communicating arteries bilaterally. PCAs  are unremarkable.  The visualized distal cervical ICA on the left is tortuous. Intracranial ICAs are patent bilaterally without stenosis. Anterior communicating artery is patent. A median artery of the corpus callosum is incidentally noted. ACAs and MCAs are otherwise unremarkable. No intracranial aneurysm is identified.  IMPRESSION: 1. No acute intracranial abnormality. 2. Very mild cerebral atrophy, otherwise unremarkable appearance of the brain. 3. Negative head MRA.   Electronically Signed   By: Logan Bores   On: 07/18/2014 21:30   Labs: Basic Metabolic Panel:  Recent Labs Lab 07/18/14 1130 07/18/14 1338 07/18/14 1825  NA 138 140  --   K 3.8 4.3  --   CL 107 106  --   CO2 19*  --   --   GLUCOSE 124* 108*  --   BUN 9 10  --   CREATININE 0.77 0.70 0.74  CALCIUM 8.8*  --   --    Liver Function Tests:  Recent Labs Lab 07/18/14 1130  AST 29  ALT 27  ALKPHOS 68  BILITOT 1.3*  PROT 7.4  ALBUMIN 4.0   CBC:  Recent Labs Lab 07/18/14 1130 07/18/14 1338 07/18/14 1825  WBC 5.1  --  6.1  NEUTROABS 2.7  --   --   HGB 14.7 16.0 14.1  HCT 42.1 47.0 40.1  MCV 87.2  --  87.2  PLT 181  --  180   Cardiac  Enzymes:  Recent Labs Lab 07/18/14 1825 07/18/14 2123 07/18/14 2340  TROPONINI <0.03 <0.03 <0.03    Signed:  Marzetta Board  [facility] 07/20/2014, 2:06 PM

## 2014-07-20 NOTE — Care Management Note (Addendum)
Case Management Note  Patient Details  Name: [name] MRN: [id number] Date of Birth: 23-Aug-1970  Subjective/Objective:    Pt admitted with essential hypertension, chest pain and TIA symptoms       Action/Plan:  Pt to discharge on Xeralto.  CM will provide assistance per insurance parameters.  CM will continue to monitor for pt disposition   Expected Discharge Date:                  Expected Discharge Plan:  Home/Self Care  In-House Referral:     Discharge planning Services  CM Consult, Medication Assistance ([place])  Post Acute Care Choice:    Choice offered to:     DME Arranged:    DME Agency:     HH Arranged:    HH Agency:     Status of Service:  Complete, will sign off Medicare Important Message Given:  No Date Medicare IM Given:    Medicare IM give by:    Date Additional Medicare IM Given:    Additional Medicare Important Message give by:      Disposition Plan:  Home with Self Care  If discussed at Long Length of Stay Meetings, dates discussed:    Additional Comments: 07/20/14 Elenor Quinones, RN, BSN [phone].  Benefit check received;  Xarelto: ([facility]pay estimated at [facility]) $50.00 for 30 day supply, no auth required  Information was relayed to pt.  CM met with pt prior to discharge and pt stated he understood both cards provided to him for [facility].   No additional CM needs at this time   07/19/14 Elenor Quinones, RN, BSN [phone].  CM requested benefit check for Xeralto.  CM provided pt both cards (free 30 day and $0 Copay card).  CM instructed pt to provide both free 30 day card with prescription, and that he could use the $ 0 monthly copay card post the first month.  CM contacted pharmacy for initial [place] however was informed that it was specific to DVT/PE diagnosis.  Pt and significant other voiced concerns regarding possible recalls with medication, CM informed pt that specific information regarding medication should come from MD.  CM  consulted with Bedside Nurse Raquel Sarna and requested MD be contacted and concerns be relayed.  Bedside nurse will follow up.  CM contacted [facility] choice ([facility] [address] [facility] [place]) and was informed that prescription is ready for fill.  CM will provide blue cross blue shield required copay and if prior authorization is required once received from benefit check. Maryclare Labrador, RN 07/20/2014, 10:46 AM

## 2014-08-02 ENCOUNTER — Ambulatory Visit (INDEPENDENT_AMBULATORY_CARE_PROVIDER_SITE_OTHER): Payer: Federal, State, Local not specified - PPO | Admitting: Nurse Practitioner

## 2014-08-02 ENCOUNTER — Encounter: Payer: Self-pay | Admitting: Nurse Practitioner

## 2014-08-02 VITALS — BP 142/96 | HR 78 | Ht 69.0 in | Wt 250.8 lb

## 2014-08-02 DIAGNOSIS — I480 Paroxysmal atrial fibrillation: Secondary | ICD-10-CM | POA: Diagnosis not present

## 2014-08-02 DIAGNOSIS — I10 Essential (primary) hypertension: Secondary | ICD-10-CM | POA: Diagnosis not present

## 2014-08-02 DIAGNOSIS — E785 Hyperlipidemia, unspecified: Secondary | ICD-10-CM

## 2014-08-02 DIAGNOSIS — R0789 Other chest pain: Secondary | ICD-10-CM

## 2014-08-02 DIAGNOSIS — R072 Precordial pain: Secondary | ICD-10-CM

## 2014-08-02 LAB — CBC WITH DIFFERENTIAL/PLATELET
BASOS ABS: 0 10*3/uL (ref 0.0–0.1)
Basophils Relative: 0.5 % (ref 0.0–3.0)
Eosinophils Absolute: 0.1 10*3/uL (ref 0.0–0.7)
Eosinophils Relative: 1.9 % (ref 0.0–5.0)
HEMATOCRIT: 45.1 % (ref 39.0–52.0)
Hemoglobin: 15.4 g/dL (ref 13.0–17.0)
LYMPHS ABS: 2.1 10*3/uL (ref 0.7–4.0)
Lymphocytes Relative: 33.7 % (ref 12.0–46.0)
MCHC: 34.3 g/dL (ref 30.0–36.0)
MCV: 88.9 fl (ref 78.0–100.0)
MONO ABS: 0.4 10*3/uL (ref 0.1–1.0)
Monocytes Relative: 5.8 % (ref 3.0–12.0)
NEUTROS ABS: 3.6 10*3/uL (ref 1.4–7.7)
Neutrophils Relative %: 58.1 % (ref 43.0–77.0)
Platelets: 214 10*3/uL (ref 150.0–400.0)
RBC: 5.07 Mil/uL (ref 4.22–5.81)
RDW: 13.9 % (ref 11.5–15.5)
WBC: 6.1 10*3/uL (ref 4.0–10.5)

## 2014-08-02 LAB — BASIC METABOLIC PANEL
BUN: 10 mg/dL (ref 6–23)
CALCIUM: 9.4 mg/dL (ref 8.4–10.5)
CHLORIDE: 107 meq/L (ref 96–112)
CO2: 26 meq/L (ref 19–32)
CREATININE: 0.76 mg/dL (ref 0.40–1.50)
GFR: 118.67 mL/min (ref >60.00)
Glucose, Bld: 107 mg/dL — ABNORMAL HIGH (ref 70–99)
POTASSIUM: 4 meq/L (ref 3.5–5.1)
SODIUM: 137 meq/L (ref 135–145)

## 2014-08-02 NOTE — Progress Notes (Signed)
Patient Name: Scott Walter Date of Encounter: 08/02/2014  Primary Care Provider:  Drema Pry, DO Primary Cardiologist:  Lizbeth Bark, MD   Chief Complaint  44 year old male with a history of paroxysmal atrial relation who presents following recent hospitalization related to chest pain and possible TIA.  Past Medical History   Past Medical History  Diagnosis Date  . Essential hypertension   . PAF (paroxysmal atrial fibrillation)     a. Dx 05/2012;  b. 06/2014 Xarelto started in setting of ? TIA (CHA2DS2VASc = 3);  c. 06/2014 Echo: EF 60-65%, no rwma.  [name] ETOH abuse   . Bipolar disorder     h/o SI  . Depression   . Midsternal chest pain     a. 06/2014 Myoview: EF 50%, no ischemia/infarct.  . History of stomach ulcers ~ 2009  . WCBJSEGB(151.7)     "monthly" (01/26/2013)  . Migraine     "maybe 2 in my lifetime" (01/26/2013)  . Chronic lower back pain   . Anxiety   . Chronic insomnia   . PTSD (post-traumatic stress disorder)   . Dyslipidemia    Past Surgical History  Procedure Laterality Date  . Knee arthroscopy Right 09/2011    Allergies  Allergies  Allergen Reactions  . Depakote [Divalproex Sodium] Anaphylaxis and Shortness Of Breath  . Ambien [Zolpidem Tartrate] Other (See Comments)    "Nightmares and mood swings    HPI  44 year old male with the above problem list. He has a history of paroxysmal atrial fibrillation and has been using flecainide in a pill in the pocket approach about 3 times per year. He has not previously been chronically anticoagulated secondary to a CHA2DS2VASc of 1. He was recently admitted to [facility] after developing chest heaviness while at work that was associated with shortness of breath. He also lost vision in his left eye during that episode. Symptoms resolved within a few minutes but due to concern over symptoms, he presented to the emergency department. There, ECG was nonacute. Cardiac markers were normal. He was admitted and underwent both  cardiac and neurologic evaluation. Both CT of the brain and MRI/MRA did not show any acute cranial processes. Carotid ultrasound showed bilateral 1-39% stenosis. Echocardiogram showed an EF of 6065% with LVH and no regional wall motion abnormalities. Lexiscan Myoview stress testing was performed and was negative for ischemia. In light of hypertension and now possible TIA, it was felt that he would benefit from oral anticoagulation therapy and therefore, xarelto 29 g daily was added. It is notable that he did not have any atrial fibrillation during his hospitalization that was felt to be possible that perhaps atrial fibrillation caused his symptoms prompting admission. 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 He has not been having any chest pain, dyspnea, palpitations, PND, orthopnea, dizziness, syncope, edema, or early satiety.  Home Medications  Prior to Admission medications   Medication Sig Start Date End Date Taking? Authorizing Provider  atorvastatin (LIPITOR) 40 MG tablet Take 1 tablet (40 mg total) by mouth daily at 6 PM. 07/20/14  Yes Costin Karlyne Greenspan, MD  fexofenadine (ALLEGRA) 180 MG tablet Take 180 mg by mouth daily.   Yes Historical Provider, MD  flecainide (TAMBOCOR) 150 MG tablet Take 150 mg by mouth as needed.   Yes Historical Provider, MD  metaxalone (SKELAXIN) 800 MG tablet Take 800 mg by mouth 2 (two) times daily. 06/18/14  Yes Historical Provider, MD  metoprolol succinate (  TOPROL-XL) 50 MG 24 hr tablet Take 1 tablet (50 mg total) by mouth daily. Take with or immediately following a meal. 10/10/13  Yes Fay Records, MD  naproxen (NAPROSYN) 500 MG tablet Take 500 mg by mouth daily. 06/18/14  Yes Historical Provider, MD  Probiotic Product (PROBIOTIC DAILY PO) Take 1 tablet by mouth daily.   Yes Historical Provider, MD  rivaroxaban (XARELTO) 20 MG TABS tablet Take 1 tablet (20 mg total) by mouth daily with supper. 07/20/14  Yes Costin Karlyne Greenspan, MD      Review of Systems  As above, doing well.  He denies chest pain, palpitations, dyspnea, pnd, orthopnea, n, v, dizziness, syncope, edema, weight gain, or early satiety.  All other systems reviewed and are otherwise negative except as noted above.  Physical Exam  VS:  BP 142/96 mmHg  Pulse 78  Ht 5\' 9"  (1.753 m)  Wt 250 lb 12.8 oz (113.762 kg)  BMI 37.02 kg/m2 , BMI Body mass index is 37.02 kg/(m^2). GEN: Well nourished, well developed, in no acute distress. HEENT: normal. Neck: Supple, no JVD, carotid bruits, or masses. Cardiac: RRR, no murmurs, rubs, or gallops. No clubbing, cyanosis, edema.  Radials/DP/PT 2+ and equal bilaterally.  Respiratory:  Respirations regular and unlabored, clear to auscultation bilaterally. GI: Soft, nontender, nondistended, BS + x 4. MS: no deformity or atrophy. Skin: warm and dry, no rash. Neuro:  Strength and sensation are intact. Psych: Normal affect.  Accessory Clinical Findings  ECG - regular sinus rhythm, 78, inferior Q's with T-wave inversion in 3 and aVF. No acute ST or T changes  Assessment & Plan  1.  Midsternal chest pain: Status post recent cardiac evaluation with negative cardiac markers, normal LV function by echo, and nonischemic stress testing. He has had no further chest pain. It is not clear as to whether or not paroxysmal atrial fibrillation contributed to his symptoms. He has not had any recurrence of symptoms including palpitations.  2. Paroxysmal atrial fibrillation: He says that he experiences palpitations that he identifies as afib about 3 times per year, usually lasting 10 minutes. He does take flecainide each time he experiences palpitations.  He has previously had a normal ETT that was performed after starting flecainide and just recently had a normal lexiscan Myoview. He was placed on xarelto therapy in light of recent possible TIA and he is tolerating this. I will check a CBC and bmet today to reevaluate blood count and renal  function. Continue baseline beta blocker therapy.  3. Essential hypertension: Blood pressure is elevated today. He notes that this typically runs in the 120s to 130s. He will continue to follow and I will not make changes to his medication today.  4. TIA Symptoms:  Loss of vision in the left eye associated with chest pain. Neuro w/u w/o acute findings.  He has f/u with neurology as an outpt.  5.  Dyslipidemia:  On statin therapy.  LDL 98 on recent evaluation with nl LFT's.  6.  Disposition: Follow-up with Dr. [name] in 6 months or sooner if necessary.   Murray Hodgkins, NP 08/02/2014, 12:49 PM

## 2014-08-02 NOTE — Patient Instructions (Signed)
Medication Instructions:  Your physician recommends that you continue on your current medications as directed. Please refer to the Current Medication list given to you today.  Labwork: Your physician recommends that you have labs today. BMET and CBC  Testing/Procedures: NONE  Follow-Up: Your physician wants you to follow-up in: 6 months with Dr. Theressa Stamps will receive a reminder letter in the mail two months in advance. If you don't receive a letter, please call our office to schedule the follow-up appointment.   Any Other Special Instructions Will Be Listed Below (If Applicable).

## 2014-09-06 ENCOUNTER — Encounter: Payer: Self-pay | Admitting: Internal Medicine

## 2014-09-06 ENCOUNTER — Ambulatory Visit (INDEPENDENT_AMBULATORY_CARE_PROVIDER_SITE_OTHER): Payer: Federal, State, Local not specified - PPO | Admitting: Internal Medicine

## 2014-09-06 VITALS — BP 130/86 | HR 78 | Temp 98.4°F | Resp 20 | Ht 69.0 in | Wt 249.0 lb

## 2014-09-06 DIAGNOSIS — M545 Low back pain, unspecified: Secondary | ICD-10-CM | POA: Insufficient documentation

## 2014-09-06 DIAGNOSIS — N509 Disorder of male genital organs, unspecified: Secondary | ICD-10-CM

## 2014-09-06 DIAGNOSIS — Z23 Encounter for immunization: Secondary | ICD-10-CM

## 2014-09-06 DIAGNOSIS — E785 Hyperlipidemia, unspecified: Secondary | ICD-10-CM | POA: Diagnosis not present

## 2014-09-06 DIAGNOSIS — Z0000 Encounter for general adult medical examination without abnormal findings: Secondary | ICD-10-CM | POA: Diagnosis not present

## 2014-09-06 DIAGNOSIS — N50819 Testicular pain, unspecified: Secondary | ICD-10-CM | POA: Insufficient documentation

## 2014-09-06 LAB — URINALYSIS, ROUTINE W REFLEX MICROSCOPIC
Bilirubin Urine: NEGATIVE
Hgb urine dipstick: NEGATIVE
KETONES UR: NEGATIVE
Leukocytes, UA: NEGATIVE
Nitrite: NEGATIVE
PH: 7 (ref 5.0–8.0)
Specific Gravity, Urine: 1.02 (ref 1.000–1.030)
Total Protein, Urine: NEGATIVE
UROBILINOGEN UA: 4 — AB (ref 0.0–1.0)
Urine Glucose: NEGATIVE

## 2014-09-06 MED ORDER — CIPROFLOXACIN HCL 500 MG PO TABS: 500.0000 mg | ORAL_TABLET | Freq: Two times a day (BID) | ORAL | Status: DC

## 2014-09-06 NOTE — Patient Instructions (Signed)
Work on 25 lbs weight loss over next 6-12 months Make changes to your diet and discussed Return in 4-6 weeks for blood test (fasting lipid panel, and liver function tests) Take cipro 500 mg twice daily for 10 days Contact our office if your testicular discomfort does not improve.

## 2014-09-06 NOTE — Progress Notes (Signed)
Subjective:    Patient ID: Scott Walter, male    DOB: [birth year], 44 y.o.   MRN: [id number]  HPI  44 year old Hispanic male with history of paroxysmal atrial fibrillation, hypertension and bipolar disorder for routine physical. Interval medical history-patient admitted on May 2016 secondary to possible TIA. He also complained of chest pain. His cardiac workup was unremarkable. Patient started on Xarelto.  He quit smoking 3 years ago. He drinks socially.  He still works for [employer].  Patient complains of intermittent tight sensation of both testicles. This has been ongoing for 1-2 weeks.  Patient also complains of intermittent low back pain. His low back pain worse after prolonged sitting or driving.  Review of Systems   Constitutional: Negative for activity change, appetite change and unexpected weight change.  Eyes: Negative for visual disturbance.  Respiratory: Negative for cough, chest tightness and shortness of breath.   Cardiovascular: Negative for chest pain.  Genitourinary: Negative for difficulty urinating.  Neurological: Negative for headaches.  Gastrointestinal: Negative for abdominal pain, melena or hematochezia.  Occasional heartburn Psych: Negative for depression or anxiety Endo:  No polyuria or polydypsia   He denies new sexual contact.  Low risk for STD     Past Medical History  Diagnosis Date  . Essential hypertension   . PAF (paroxysmal atrial fibrillation)     a. Dx 05/2012;  b. 06/2014 Xarelto started in setting of ? TIA (CHA2DS2VASc = 3);  c. 06/2014 Echo: EF 60-65%, no rwma.  [name] ETOH abuse   . Bipolar disorder     h/o SI  . Depression   . Midsternal chest pain     a. 06/2014 Myoview: EF 50%, no ischemia/infarct.  . History of stomach ulcers ~ 2009  . XNTZGYFV(494.4)     "monthly" (01/26/2013)  . Migraine     "maybe 2 in my lifetime" (01/26/2013)  . Chronic lower back pain   . Anxiety   . Chronic insomnia   . PTSD (post-traumatic stress disorder)   .  Dyslipidemia     History   Social History  . Marital Status: Married    Spouse Name: N/A  . Number of Children: N/A  . Years of Education: N/A   Occupational History  . Not on file.   Social History Main Topics  . Smoking status: Former Smoker -- 1.00 packs/day for 30 years    Types: Cigarettes    Quit date: 01/03/2012  . Smokeless tobacco: Former Systems developer    Types: Chew     Comment: 01/26/2013 "quit chewing > 20 yr ago"  . Alcohol Use: Yes     Comment: occ  . Drug Use: No  . Sexual Activity: Not Currently   Other Topics Concern  . Not on file   Social History Narrative    Past Surgical History  Procedure Laterality Date  . Knee arthroscopy Right 09/2011    Family History  Problem Relation Age of Onset  . CAD Mother   . Diabetes Mother   . Emphysema Father   . Healthy Sister   . Healthy Brother   . Healthy Sister   . Healthy Sister   . Healthy Sister   . Healthy Sister   . Healthy Sister   . Healthy Sister   . Healthy Sister   . Healthy Brother   . Healthy Brother   . Healthy Brother   . HIV Brother     Allergies  Allergen Reactions  . Depakote [Divalproex Sodium] Anaphylaxis and Shortness Of  Breath  . Ambien [Zolpidem Tartrate] Other (See Comments)    "Nightmares and mood swings    Current Outpatient Prescriptions on File Prior to Visit  Medication Sig Dispense Refill  . atorvastatin (LIPITOR) 40 MG tablet Take 1 tablet (40 mg total) by mouth daily at 6 PM. 30 tablet 1  . flecainide (TAMBOCOR) 150 MG tablet Take 150 mg by mouth as needed.    . metaxalone (SKELAXIN) 800 MG tablet Take 800 mg by mouth 2 (two) times daily.  0  . metoprolol succinate (TOPROL-XL) 50 MG 24 hr tablet Take 1 tablet (50 mg total) by mouth daily. Take with or immediately following a meal. 90 tablet 3  . naproxen (NAPROSYN) 500 MG tablet Take 500 mg by mouth daily.  0  . Probiotic Product (PROBIOTIC DAILY PO) Take 1 tablet by mouth daily.    . rivaroxaban (XARELTO) 20 MG TABS  tablet Take 1 tablet (20 mg total) by mouth daily with supper. 30 tablet 0   No current facility-administered medications on file prior to visit.    BP 130/86 mmHg  Pulse 78  Temp(Src) 98.4 F (36.9 C) (Oral)  Resp 20  Ht 5\' 9"  (1.753 m)  Wt 249 lb (112.946 kg)  BMI 36.75 kg/m2  SpO2 98%    Objective:   Physical Exam   Constitutional: Appears well-developed and well-nourished. No distress.  Head: Normocephalic and atraumatic.  Ear:  Right and left ear normal.  TMs clear.  Hearing is grossly normal Mouth/Throat: Oropharynx is clear and moist. crowded oropharynx Eyes: Conjunctivae are normal. Pupils are equal, round, and reactive to light.  Neck: Normal range of motion. Neck supple. No thyromegaly present. No carotid bruit Cardiovascular: Normal rate, regular rhythm and normal heart sounds.  Exam reveals no gallop and no friction rub.  No murmur heard. Pulmonary/Chest: Effort normal and breath sounds normal.  No wheezes. No rales.  Abdominal: Soft. Bowel sounds are normal. No mass. There is no tenderness. External hemorrhoids, no rectal masses Prostate:  No asymmetry or nodules.  Slightly tender prostate gland Neurological: Alert. No cranial nerve deficit.  Skin: Skin is warm and dry.  Psychiatric: Normal mood and affect. Behavior is normal.  Heme: no abnormal bleeding       Assessment & Plan:

## 2014-09-06 NOTE — Assessment & Plan Note (Signed)
I suspect patient's testicular discomfort from prostatitis.  Check UA with microscopy.  Treat with Cipro 500 mg twice daily for 10 days.

## 2014-09-06 NOTE — Assessment & Plan Note (Addendum)
Reviewed adult health maintenance protocols.  Regular exercise and weight are seen to encourage. Patient updated with Tdap.  Congratulated patient on quitting smoking.    Patient started of atorvastatin during recent hospitalization.  Arrange f/u FLP and LFTs.

## 2014-09-06 NOTE — Progress Notes (Signed)
Pre visit review using our clinic review tool, if applicable. No additional management support is needed unless otherwise documented below in the visit note. 

## 2014-09-06 NOTE — Assessment & Plan Note (Signed)
Patient likely has lumbar strain. I reviewed home exercises.  Expectant management.

## 2014-09-25 ENCOUNTER — Other Ambulatory Visit: Payer: Self-pay | Admitting: Internal Medicine

## 2014-10-27 ENCOUNTER — Other Ambulatory Visit: Payer: Self-pay | Admitting: Internal Medicine

## 2014-10-30 ENCOUNTER — Telehealth: Payer: Self-pay | Admitting: Internal Medicine

## 2014-10-30 ENCOUNTER — Ambulatory Visit (INDEPENDENT_AMBULATORY_CARE_PROVIDER_SITE_OTHER): Payer: Federal, State, Local not specified - PPO | Admitting: Family Medicine

## 2014-10-30 ENCOUNTER — Encounter: Payer: Self-pay | Admitting: Family Medicine

## 2014-10-30 VITALS — BP 128/84 | HR 67 | Temp 98.2°F | Resp 12 | Wt 257.0 lb

## 2014-10-30 DIAGNOSIS — R042 Hemoptysis: Secondary | ICD-10-CM

## 2014-10-30 DIAGNOSIS — I480 Paroxysmal atrial fibrillation: Secondary | ICD-10-CM

## 2014-10-30 NOTE — Telephone Encounter (Signed)
Appointment to see Dr. Elease Hashimoto today

## 2014-10-30 NOTE — Progress Notes (Signed)
Subjective:    Patient ID: Scott Walter, male    DOB: August 25, 1970, 44 y.o.   MRN: [id number]  HPI Patient seen with 2 month history of reported intermittent hemoptysis. His recent history is that he was started apparently back in June on Xarelto.  He presented at that time and was admitted with chest pain and probable TIA with prior history of atrial fibrillation. He underwent stress testing at that point that was unremarkable. Echocardiogram normal ejection fraction with no major valvular abnormalities. Carotid studies were unremarkable. Medications reviewed and in addition to the above he takes atorvastatin, flecainide, metoprolol.  He quit smoking back in 2013. He relates about 12 different episodes of coughing up some blood since June. He has not had any weight loss and in fact has had about an 8 pound weight gain. Cough relatively mild. Denies any dyspnea. No pleuritic pain. No epistaxis. No recent chest pains. Does have some general fatigue. Appetite is good. Occasional nausea without vomiting. He denies any hematuria or hematochezia. No fevers, chills, or night sweats.  Blood he coughs up tends to be somewhat "dark red ". He states on one episode he coughed up about 3 ounces sometimes less. Never more.  Past Medical History  Diagnosis Date  . Essential hypertension   . PAF (paroxysmal atrial fibrillation)     a. Dx 05/2012;  b. 06/2014 Xarelto started in setting of ? TIA (CHA2DS2VASc = 3);  c. 06/2014 Echo: EF 60-65%, no rwma.  [name] ETOH abuse   . Bipolar disorder     h/o SI  . Depression   . Midsternal chest pain     a. 06/2014 Myoview: EF 50%, no ischemia/infarct.  . History of stomach ulcers ~ 2009  . DJSHFWYO(378.5)     "monthly" (01/26/2013)  . Migraine     "maybe 2 in my lifetime" (01/26/2013)  . Chronic lower back pain   . Anxiety   . Chronic insomnia   . PTSD (post-traumatic stress disorder)   . Dyslipidemia    Past Surgical History  Procedure Laterality Date  . Knee  arthroscopy Right 09/2011    reports that he quit smoking about 2 years ago. His smoking use included Cigarettes. He has a 30 pack-year smoking history. He has quit using smokeless tobacco. His smokeless tobacco use included Chew. He reports that he drinks alcohol. He reports that he does not use illicit drugs. family history includes CAD in his mother; Diabetes in his mother; Emphysema in his father; HIV in his brother; Healthy in his brother, brother, brother, brother, sister, sister, sister, sister, sister, sister, sister, and sister. Allergies  Allergen Reactions  . Depakote [Divalproex Sodium] Anaphylaxis and Shortness Of Breath  . Ambien [Zolpidem Tartrate] Other (See Comments)    "Nightmares and mood swings      Review of Systems  Constitutional: Positive for fatigue. Negative for fever, chills, appetite change and unexpected weight change.  Respiratory: Positive for cough. Negative for shortness of breath and wheezing.   Cardiovascular: Negative for chest pain, palpitations and leg swelling.  Gastrointestinal: Negative for abdominal pain and blood in stool.  Genitourinary: Negative for hematuria.  Neurological: Negative for dizziness.  Hematological: Negative for adenopathy.       Objective:   Physical Exam  Constitutional: He appears well-developed and well-nourished. No distress.  HENT:  Nose: Nose normal.  Mouth/Throat: Oropharynx is clear and moist.  Neck: Neck supple. No JVD present.  Cardiovascular: Normal rate and regular rhythm.   Pulmonary/Chest: Effort normal  and breath sounds normal. No respiratory distress. He has no wheezes. He has no rales.  Musculoskeletal: He exhibits no edema.  Lymphadenopathy:    He has no cervical adenopathy.          Assessment & Plan:  Intermittent reported hemoptysis in a patient recently started on Xarelto for atrial fibrillation history and prior TIA. He does not have any red flags such as appetite or weight loss.  No  respiratory distress. Pulse oximetry 97%. Doubt infectious. Start with CBC and chest x-ray. Consider CT of chest

## 2014-10-30 NOTE — Progress Notes (Signed)
Pre visit review using our clinic review tool, if applicable. No additional management support is needed unless otherwise documented below in the visit note. 

## 2014-10-30 NOTE — Telephone Encounter (Signed)
Patient Name: DARRELL HAUK DOB: December 30, 1970 Initial Comment Caller says her husband was started on Dorelto for A-fib, hi BP, and is having the worse of the side effects. He has coughed up blood in the AM, his gums bleed, has sharp stomach pains and dizziness Nurse Assessment Nurse: Marcelline Deist, RN, Kermit Balo Date/Time (Eastern Time): 10/30/2014 3:09:52 PM Confirm and document reason for call. If symptomatic, describe symptoms. ---Caller says her husband was started on Xarelto in June for mini stroke, has A-fib, high BP. He is having the worse of the side effects. He has coughed up blood in the AM, his gums bleed, has c/o sharp stomach pains and dizziness. Has the patient traveled out of the country within the last 30 days? ---Not Applicable Does the patient require triage? ---Yes Related visit to physician within the last 2 weeks? ---No Does the PT have any chronic conditions? (i.e. diabetes, asthma, etc.) ---Yes List chronic conditions. ---high BP, A fib Guidelines Guideline Title Affirmed Question Affirmed Notes Coughing Up Blood Taking Coumadin (warfarin) or other strong blood thinner, or known bleeding disorder (e.g., thrombocytopenia) Final Disposition User See Physician within [place], RN, [employer] also reports that her husband has gained roughly 30 lbs. as well. referenced Xarelto on [employer].com for side effects - bleeding gums, coughing up blood & dizziness are all listed. Referrals REFERRED TO PCP OFFICE Disagree/Comply: Comply

## 2014-10-30 NOTE — Patient Instructions (Signed)
Hemoptysis  Hemoptysis, which means coughing up blood, can be a sign of a minor problem or a serious medical condition. The blood that is coughed up may come from the lungs and airways. Coughed-up blood can also come from bleeding that occurs outside the lungs and airways. Blood can drain into the windpipe during a severe nosebleed or when blood is vomited from the stomach. Because hemoptysis can be a sign of something serious, a medical evaluation is required. For some people with hemoptysis, no definite cause is ever identified.  CAUSES   The most common cause of hemoptysis is bronchitis. Some other common causes include:    A ruptured blood vessel caused by coughing or an infection.    A medical condition that causes damage to the large air passageways (bronchiectasis).    A blood clot in the lungs (pulmonary embolism).    Pneumonia.    Tuberculosis.    Breathing in a small foreign object.    Cancer.  For some people with hemoptysis, no definite cause is ever identified.   HOME CARE INSTRUCTIONS   Only take over-the-counter or prescription medicines as directed by your caregiver. Do not use cough suppressants unless your caregiver approves.   If your caregiver prescribes antibiotic medicines, take them as directed. Finish them even if you start to feel better.   Do not smoke. Also avoid secondhand smoke.   Follow up with your caregiver as directed.  SEEK IMMEDIATE MEDICAL CARE IF:    You cough up bloody mucus for longer than a week.   You have a blood-producing cough that is severe or getting worse.   You have a blood-producing cough thatcomes and goes over time.   You develop problems with your breathing.    You vomit blood.   You develop bloody or black-colored stools.   You have chest pain.    You develop night sweats.   You feel faint or pass out.    You have a fever or persistent symptoms for more than 2-3 days.   You have a fever and your symptoms suddenly get worse.  MAKE  SURE YOU:   Understand these instructions.   Will watch your condition.   Will get help right away if you are not doing well or get worse.  Document Released: 04/14/2001 Document Revised: 01/21/2012 Document Reviewed: 11/21/2011  [employer] Patient Information 2015 [employer]. This information is not intended to replace advice given to you by your health care provider. Make sure you discuss any questions you have with your health care provider.

## 2014-10-31 LAB — CBC WITH DIFFERENTIAL/PLATELET
BASOS PCT: 1.5 % (ref 0.0–3.0)
Basophils Absolute: 0.1 10*3/uL (ref 0.0–0.1)
EOS PCT: 1.4 % (ref 0.0–5.0)
Eosinophils Absolute: 0.1 10*3/uL (ref 0.0–0.7)
HCT: 45.2 % (ref 39.0–52.0)
Hemoglobin: 15.2 g/dL (ref 13.0–17.0)
Lymphocytes Relative: 34 % (ref 12.0–46.0)
Lymphs Abs: 2.7 10*3/uL (ref 0.7–4.0)
MCHC: 33.7 g/dL (ref 30.0–36.0)
MCV: 90.5 fl (ref 78.0–100.0)
MONO ABS: 0.6 10*3/uL (ref 0.1–1.0)
Monocytes Relative: 7.6 % (ref 3.0–12.0)
NEUTROS PCT: 55.5 % (ref 43.0–77.0)
Neutro Abs: 4.5 10*3/uL (ref 1.4–7.7)
Platelets: 175 10*3/uL (ref 150.0–400.0)
RBC: 5 Mil/uL (ref 4.22–5.81)
RDW: 14.2 % (ref 11.5–15.5)
WBC: 8 10*3/uL (ref 4.0–10.5)

## 2014-11-01 ENCOUNTER — Ambulatory Visit (INDEPENDENT_AMBULATORY_CARE_PROVIDER_SITE_OTHER)
Admission: RE | Admit: 2014-11-01 | Discharge: 2014-11-01 | Disposition: A | Discharge status: Home / Self Care | Payer: Federal, State, Local not specified - PPO | Source: Ambulatory Visit | Attending: Family Medicine | Admitting: Family Medicine

## 2014-11-01 DIAGNOSIS — R042 Hemoptysis: Secondary | ICD-10-CM | POA: Diagnosis not present

## 2014-11-02 ENCOUNTER — Other Ambulatory Visit: Payer: Self-pay | Admitting: Family Medicine

## 2014-11-02 DIAGNOSIS — R042 Hemoptysis: Secondary | ICD-10-CM

## 2014-11-09 ENCOUNTER — Ambulatory Visit (INDEPENDENT_AMBULATORY_CARE_PROVIDER_SITE_OTHER)
Admission: RE | Admit: 2014-11-09 | Discharge: 2014-11-09 | Disposition: A | Discharge status: Home / Self Care | Payer: Federal, State, Local not specified - PPO | Source: Ambulatory Visit | Attending: Family Medicine | Admitting: Family Medicine

## 2014-11-09 DIAGNOSIS — R042 Hemoptysis: Secondary | ICD-10-CM

## 2014-11-09 MED ORDER — IOHEXOL 300 MG/ML  SOLN
80.0000 mL | Freq: Once | INTRAMUSCULAR | Status: AC | PRN
  Administered 2014-11-09: 80 mL via INTRAVENOUS

## 2014-11-21 ENCOUNTER — Other Ambulatory Visit: Payer: Self-pay | Admitting: *Deleted

## 2014-11-21 MED ORDER — RIVAROXABAN 20 MG PO TABS: 20.0000 mg | ORAL_TABLET | Freq: Every day | ORAL | Status: DC

## 2015-01-22 ENCOUNTER — Other Ambulatory Visit: Payer: Self-pay | Admitting: Internal Medicine

## 2015-01-22 NOTE — Telephone Encounter (Signed)
Medication Detail      Disp Refills Start End     metoprolol succinate (TOPROL-XL) 50 MG 24 hr tablet 90 tablet 0 10/27/2014     Sig: TAKE 1 TABLET (50 MG TOTAL) BY MOUTH DAILY. TAKE WITH OR IMMEDIATELY FOLLOWING A MEAL.    Notes to Pharmacy: Please call and schedule an appointment for December    E-Prescribing Status: Receipt confirmed by pharmacy (10/27/2014 10:42 AM EDT)     Pharmacy

## 2015-01-23 ENCOUNTER — Other Ambulatory Visit: Payer: Self-pay

## 2015-01-23 MED ORDER — METOPROLOL SUCCINATE ER 50 MG PO TB24: ORAL_TABLET | ORAL | Status: DC

## 2015-01-23 NOTE — Telephone Encounter (Signed)
Rogelia Mire, NP at 08/02/2014 12:48 PM  metoprolol succinate (TOPROL-XL) 50 MG 24 hr tabletTake 1 tablet (50 mg total) by mouth daily. Take with or immediately  Medication Instructions:  Your physician recommends that you continue on your current medications as directed. Please refer to the Current Medication list given to you today.

## 2015-01-24 ENCOUNTER — Encounter: Payer: Self-pay | Admitting: Student

## 2015-01-24 ENCOUNTER — Telehealth: Payer: Self-pay | Admitting: Internal Medicine

## 2015-01-24 ENCOUNTER — Encounter: Payer: Self-pay | Admitting: Internal Medicine

## 2015-01-24 NOTE — Telephone Encounter (Signed)
New Message  This message is to inform you that we have made 3 consecutive attempts to contact the patient since 12/08/2014. We have also mailed a letter to the patient to inform them to call in and schedule. Although we were unsuccessful in these attempts we wanted you to be aware of our efforts. Will remove the patient from our recall list at this time  Thanks,   Jarold Motto [facility]

## 2015-01-24 NOTE — Telephone Encounter (Signed)
Noted  

## 2015-01-30 ENCOUNTER — Other Ambulatory Visit: Payer: Self-pay | Admitting: Internal Medicine

## 2015-02-26 ENCOUNTER — Other Ambulatory Visit: Payer: Self-pay | Admitting: Internal Medicine

## 2015-03-09 ENCOUNTER — Ambulatory Visit: Payer: Federal, State, Local not specified - PPO | Admitting: Internal Medicine

## 2015-04-04 ENCOUNTER — Other Ambulatory Visit: Payer: Self-pay | Admitting: Internal Medicine

## 2015-04-05 NOTE — Telephone Encounter (Signed)
Patient Instructions     Medication Instructions:  Your physician recommends that you continue on your current medications as directed. Please refer to the Current Medication list given to you today.  Labwork: Your physician recommends that you have labs today. BMET and CBC  Testing/Procedures: NONE  Follow-Up: Your physician wants you to follow-up in: 6 months with Dr. Theressa Stamps will receive a reminder letter in the mail two months in advance. If you don't receive a letter, please call our office to schedule the follow-up appointment.      Rodman Key, RN at 01/24/2015 3:37 PM     Status: Signed       Expand All Collapse All   Noted.            Santo Held at 01/24/2015 3:09 PM     Status: Signed       Expand All Collapse All   New Message  This message is to inform you that we have made 3 consecutive attempts to contact the patient since 12/08/2014. We have also mailed a letter to the patient to inform them to call in and schedule. Although we were unsuccessful in these attempts we wanted you to be aware of our efforts. Will remove the patient from our recall list at this time  Thanks,   Jarold Motto [facility]       metoprolol succinate (TOPROL-XL) 50 MG 24 hr tablet  Medication   Date: 02/27/2015  Department: [facility]  Ordering/Authorizing: Fay Records, MD      Order Providers    Prescribing Provider Encounter Provider   Fay Records, MD Fay Records, MD    Medication Detail      Disp Refills Start End     metoprolol succinate (TOPROL-XL) 50 MG 24 hr tablet 15 tablet 0 02/27/2015     Sig: TAKE 1 TABLET (50 MG TOTAL) BY MOUTH DAILY. TAKE WITH OR IMMEDIATELY FOLLOWING A MEAL.    Notes to Pharmacy: Please call our office to schedule an 6 mos appointment for future refills. [phone]. Thank you 2nd attempt    E-Prescribing Status: Receipt confirmed by pharmacy (02/27/2015 8:57 AM EST)     Pharmacy     [id number] [place] [place]

## 2015-04-20 ENCOUNTER — Other Ambulatory Visit: Payer: Self-pay | Admitting: Internal Medicine

## 2015-05-11 ENCOUNTER — Other Ambulatory Visit: Payer: Self-pay | Admitting: Internal Medicine

## 2015-06-13 ENCOUNTER — Emergency Department (HOSPITAL_COMMUNITY)
Admission: EM | Admit: 2015-06-13 | Discharge: 2015-06-13 | Disposition: A | Discharge status: Home / Self Care | Payer: Federal, State, Local not specified - PPO | Attending: Emergency Medicine | Admitting: Emergency Medicine

## 2015-06-13 ENCOUNTER — Emergency Department (HOSPITAL_COMMUNITY): Payer: Federal, State, Local not specified - PPO

## 2015-06-13 ENCOUNTER — Encounter (HOSPITAL_COMMUNITY): Payer: Self-pay | Admitting: Emergency Medicine

## 2015-06-13 DIAGNOSIS — Z79899 Other long term (current) drug therapy: Secondary | ICD-10-CM | POA: Insufficient documentation

## 2015-06-13 DIAGNOSIS — G8929 Other chronic pain: Secondary | ICD-10-CM | POA: Diagnosis not present

## 2015-06-13 DIAGNOSIS — R0602 Shortness of breath: Secondary | ICD-10-CM | POA: Diagnosis not present

## 2015-06-13 DIAGNOSIS — I10 Essential (primary) hypertension: Secondary | ICD-10-CM | POA: Diagnosis not present

## 2015-06-13 DIAGNOSIS — I480 Paroxysmal atrial fibrillation: Secondary | ICD-10-CM | POA: Insufficient documentation

## 2015-06-13 DIAGNOSIS — F319 Bipolar disorder, unspecified: Secondary | ICD-10-CM | POA: Diagnosis not present

## 2015-06-13 DIAGNOSIS — F419 Anxiety disorder, unspecified: Secondary | ICD-10-CM | POA: Diagnosis not present

## 2015-06-13 DIAGNOSIS — M25511 Pain in right shoulder: Secondary | ICD-10-CM | POA: Insufficient documentation

## 2015-06-13 DIAGNOSIS — M546 Pain in thoracic spine: Secondary | ICD-10-CM | POA: Diagnosis not present

## 2015-06-13 DIAGNOSIS — E785 Hyperlipidemia, unspecified: Secondary | ICD-10-CM | POA: Diagnosis not present

## 2015-06-13 DIAGNOSIS — G4700 Insomnia, unspecified: Secondary | ICD-10-CM | POA: Insufficient documentation

## 2015-06-13 DIAGNOSIS — Z87891 Personal history of nicotine dependence: Secondary | ICD-10-CM | POA: Diagnosis not present

## 2015-06-13 MED ORDER — METHOCARBAMOL 500 MG PO TABS: 500.0000 mg | ORAL_TABLET | Freq: Two times a day (BID) | ORAL | Status: DC

## 2015-06-13 MED ORDER — HYDROCODONE-ACETAMINOPHEN 5-325 MG PO TABS: 2.0000 | ORAL_TABLET | ORAL | Status: DC | PRN

## 2015-06-13 NOTE — ED Provider Notes (Signed)
MSE was initiated and I personally evaluated the patient and placed orders (if any) at  7:25 AM on April 79, 3961. 45 year old male with PMH of atrial fibrillation initially triaged as one touch. On medical screening, he reports right sided upper back and shoulder pain with no history of recent injury, right sided abdominal pain, and shortness of breath. Feel this will require additional evaluation in the acute setting. The patient appears stable so that the remainder of the MSE may be completed by another provider.  BP 134/99 mmHg  Temp(Src) 98.2 F (36.8 C) (Oral)  Resp 18  Ht 5\' 9"  (1.753 m)  Wt 120.158 kg  BMI 39.10 kg/m2  SpO2 97%    Marella Chimes, PA-C 06/13/15 0804  Carmin Muskrat, MD 06/15/15 2144

## 2015-06-13 NOTE — ED Notes (Addendum)
Pt sts right shoulder pain with pain with inspiration starting last night; pt denies obvious injury

## 2015-06-13 NOTE — ED Provider Notes (Signed)
CSN: [id number]     Arrival date & time 06/13/15  [id number] History   First MD Initiated Contact with Patient 06/13/15 [phone]     Chief Complaint  Patient presents with  . Shoulder Pain     (Consider location/radiation/quality/duration/timing/severity/associated sxs/prior Treatment) Patient is a 45 y.o. male presenting with shoulder injury. The history is provided by the patient. No language interpreter was used.  Shoulder Injury This is a new problem. The current episode started today. The problem occurs constantly. The problem has been gradually worsening. Pertinent negatives include no coughing or fever. Nothing aggravates the symptoms. He has tried nothing for the symptoms. The treatment provided moderate relief.  Pt complains of pain in his upper right back.  Pain began this morning.  Pt reports he awoke with severe pain under his scapula  Past Medical History  Diagnosis Date  . Essential hypertension   . PAF (paroxysmal atrial fibrillation) ([place])     a. Dx 05/2012;  b. 06/2014 Xarelto started in setting of ? TIA (CHA2DS2VASc = 3);  c. 06/2014 Echo: EF 60-65%, no rwma.  [name] ETOH abuse   . Bipolar disorder ([place])     h/o SI  . Depression   . Midsternal chest pain     a. 06/2014 Myoview: EF 50%, no ischemia/infarct.  . History of stomach ulcers ~ 2009  . [id number])     "monthly" (01/26/2013)  . Migraine     "maybe 2 in my lifetime" (01/26/2013)  . Chronic lower back pain   . Anxiety   . Chronic insomnia   . PTSD (post-traumatic stress disorder)   . Dyslipidemia    Past Surgical History  Procedure Laterality Date  . Knee arthroscopy Right 09/2011   Family History  Problem Relation Age of Onset  . CAD Mother   . Diabetes Mother   . Emphysema Father   . Healthy Sister   . Healthy Brother   . Healthy Sister   . Healthy Sister   . Healthy Sister   . Healthy Sister   . Healthy Sister   . Healthy Sister   . Healthy Sister   . Healthy Brother   . Healthy Brother   . Healthy  Brother   . HIV Brother    Social History  Substance Use Topics  . Smoking status: Former Smoker -- 1.00 packs/day for 30 years    Types: Cigarettes    Quit date: 01/03/2012  . Smokeless tobacco: Former Systems developer    Types: Chew     Comment: 01/26/2013 "quit chewing > 20 yr ago"  . Alcohol Use: Yes     Comment: occ    Review of Systems  Constitutional: Negative for fever.  Respiratory: Negative for cough.   All other systems reviewed and are negative.     Allergies  Depakote and Ambien  Home Medications   Prior to Admission medications   Medication Sig Start Date End Date Taking? Authorizing Provider  atorvastatin (LIPITOR) 40 MG tablet TAKE 1 TABLET BY MOUTH EVERY DAY AT 6PM 09/26/14  Yes Doe-Hyun R Shawna Orleans, DO  FLUoxetine (PROZAC) 20 MG capsule Take 20 mg by mouth daily. 05/11/15  Yes Historical Provider, MD  gabapentin (NEURONTIN) 300 MG capsule Take 300 mg by mouth every evening. 05/11/15  Yes Historical Provider, MD  hydrOXYzine (VISTARIL) 25 MG capsule Take 25 mg by mouth daily as needed. Itching 03/08/15  Yes Historical Provider, MD  ketotifen (ZADITOR) 0.025 % ophthalmic solution Place 1 drop into both eyes 2 (two)  times daily as needed (allergies, dry eyes).   Yes Historical Provider, MD  metoprolol succinate (TOPROL-XL) 50 MG 24 hr tablet TAKE 1 TABLET (50 MG TOTAL) BY MOUTH DAILY. TAKE WITH OR IMMEDIATELY FOLLOWING A MEAL. 04/23/15  Yes Fay Records, MD  naproxen sodium (ANAPROX) 220 MG tablet Take 220 mg by mouth 2 (two) times daily as needed (pain).   Yes Historical Provider, MD  Probiotic Product (PROBIOTIC DAILY PO) Take 1 tablet by mouth daily.   Yes Historical Provider, MD  XARELTO 20 MG TABS tablet TAKE 1 TABLET BY MOUTH EVERY DAY WITH SUPPER 05/14/15  Yes Fay Records, MD   BP 130/82 mmHg  Pulse 40  Temp(Src) 98.2 F (36.8 C) (Oral)  Resp 16  Ht 5\' 9"  (1.753 m)  Wt 120.158 kg  BMI 39.10 kg/m2  SpO2 94% Physical Exam  Constitutional: He is oriented to person, place,  and time. He appears well-developed and well-nourished.  HENT:  Head: Normocephalic and atraumatic.  Eyes: Conjunctivae and EOM are normal.  Neck: Normal range of motion.  Cardiovascular: Normal rate.   Pulmonary/Chest: Effort normal.  Abdominal: Soft. He exhibits no distension.  Musculoskeletal: Normal range of motion.  Neurological: He is alert and oriented to person, place, and time.  Skin: Skin is warm.  Psychiatric: He has a normal mood and affect.  Nursing note and vitals reviewed.   ED Course  Procedures (including critical care time) Labs Review Labs Reviewed - No data to display  Imaging Review Dg Chest 2 View  06/13/2015  CLINICAL DATA:  Right shoulder pain extending into the axilla and back with shortness of breath for 1 day. History of hypertension and atrial fibrillation. EXAM: CHEST  2 VIEW COMPARISON:  Radiographs 11/01/2014.  CT 11/09/2014. FINDINGS: The heart size and mediastinal contours are stable. There is stable mild cardiomegaly. The lungs are clear. There is no pleural effusion or pneumothorax. The bones appear unremarkable. IMPRESSION: Stable chest with mild chronic cardiomegaly. No acute cardiopulmonary process. Electronically Signed   By: Richardean Sale M.D.   On: 06/13/2015 08:23   Meds ordered this encounter  Medications  . FLUoxetine (PROZAC) 20 MG capsule    Sig: Take 20 mg by mouth daily.  [name] gabapentin (NEURONTIN) 300 MG capsule    Sig: Take 300 mg by mouth every evening.  . hydrOXYzine (VISTARIL) 25 MG capsule    Sig: Take 25 mg by mouth daily as needed. Itching  . naproxen sodium (ANAPROX) 220 MG tablet    Sig: Take 220 mg by mouth 2 (two) times daily as needed (pain).  [name] ketotifen (ZADITOR) 0.025 % ophthalmic solution    Sig: Place 1 drop into both eyes 2 (two) times daily as needed (allergies, dry eyes).  [name] HYDROcodone-acetaminophen (NORCO/VICODIN) 5-325 MG tablet    Sig: Take 2 tablets by mouth every 4 (four) hours as needed.    Dispense:  16  tablet    Refill:  0    Order Specific Question:  Supervising Provider    Answer:  MILLER, BRIAN [[id number]]  . methocarbamol (ROBAXIN) 500 MG tablet    Sig: Take 1 tablet (500 mg total) by mouth 2 (two) times daily.    Dispense:  20 tablet    Refill:  0    Order Specific Question:  Supervising Provider    Answer:  Noemi Chapel [[id number]]  I have personally reviewed and evaluated these images and lab results as part of my medical decision-making.   EKG Interpretation None  Normal sinus 64, normal QRS, normal Qt, no  St changes. MDM   Final diagnoses:  Right-sided thoracic back pain    An After Visit Summary was printed and given to the patient.   Hollace Kinnier [place], PA-C 06/13/15 [place], MD 06/13/15 1257

## 2015-06-13 NOTE — Discharge Instructions (Signed)

## 2015-06-13 NOTE — ED Notes (Signed)
Ellie, One Touch PA, did medical screening on patient and states needs more of a work up on acute side.  Not One Touch appropriate.   Patient had complained of SOB, R shoulder pain and abdominal pain.

## 2015-06-20 DIAGNOSIS — H6691 Otitis media, unspecified, right ear: Secondary | ICD-10-CM | POA: Diagnosis not present

## 2015-06-20 DIAGNOSIS — G4733 Obstructive sleep apnea (adult) (pediatric): Secondary | ICD-10-CM | POA: Diagnosis not present

## 2015-07-15 ENCOUNTER — Other Ambulatory Visit: Payer: Self-pay | Admitting: Internal Medicine

## 2015-08-20 ENCOUNTER — Other Ambulatory Visit: Payer: Self-pay | Admitting: *Deleted

## 2015-08-20 ENCOUNTER — Other Ambulatory Visit: Payer: Self-pay

## 2015-08-20 MED ORDER — RIVAROXABAN 20 MG PO TABS: 20.0000 mg | ORAL_TABLET | Freq: Every day | ORAL | Status: DC

## 2015-08-20 MED ORDER — METOPROLOL SUCCINATE ER 50 MG PO TB24: 50.0000 mg | ORAL_TABLET | Freq: Every day | ORAL | Status: DC

## 2015-09-13 ENCOUNTER — Other Ambulatory Visit: Payer: Self-pay | Admitting: Internal Medicine

## 2015-09-18 ENCOUNTER — Telehealth: Payer: Self-pay | Admitting: Internal Medicine

## 2015-09-18 MED ORDER — RIVAROXABAN 20 MG PO TABS: ORAL_TABLET | ORAL | 2 refills | Status: DC

## 2015-09-18 MED ORDER — METOPROLOL SUCCINATE ER 50 MG PO TB24: ORAL_TABLET | ORAL | 2 refills | Status: DC

## 2015-09-18 NOTE — Telephone Encounter (Signed)
New Message      *STAT* If patient is at the pharmacy, call can be transferred to refill team.   1. Which medications need to be refilled? (please list name of each medication and dose if known) Xarelto, and Metoprolol not sure of the mg  2. Which pharmacy/location (including street and city if local pharmacy) [facility] [address]  3. Do they need a 30 day or 90 day supply? [place]

## 2015-09-18 NOTE — Telephone Encounter (Signed)
Pt's medications sent to pt's pharmacy as requested. Confirmation received.  

## 2015-10-02 DIAGNOSIS — M545 Low back pain: Secondary | ICD-10-CM | POA: Diagnosis not present

## 2015-10-04 DIAGNOSIS — F411 Generalized anxiety disorder: Secondary | ICD-10-CM | POA: Diagnosis not present

## 2015-11-16 ENCOUNTER — Encounter: Payer: Self-pay | Admitting: Internal Medicine

## 2015-11-20 DIAGNOSIS — F331 Major depressive disorder, recurrent, moderate: Secondary | ICD-10-CM | POA: Diagnosis not present

## 2015-11-21 ENCOUNTER — Other Ambulatory Visit: Payer: Self-pay | Admitting: *Deleted

## 2015-11-21 MED ORDER — METOPROLOL SUCCINATE ER 50 MG PO TB24: ORAL_TABLET | ORAL | 0 refills | Status: DC

## 2015-12-03 ENCOUNTER — Ambulatory Visit (INDEPENDENT_AMBULATORY_CARE_PROVIDER_SITE_OTHER): Payer: Federal, State, Local not specified - PPO | Admitting: Internal Medicine

## 2015-12-03 ENCOUNTER — Encounter: Payer: Self-pay | Admitting: Internal Medicine

## 2015-12-03 VITALS — BP 142/98 | HR 77 | Ht 69.0 in | Wt 249.8 lb

## 2015-12-03 DIAGNOSIS — E785 Hyperlipidemia, unspecified: Secondary | ICD-10-CM | POA: Diagnosis not present

## 2015-12-03 DIAGNOSIS — I10 Essential (primary) hypertension: Secondary | ICD-10-CM

## 2015-12-03 DIAGNOSIS — I480 Paroxysmal atrial fibrillation: Secondary | ICD-10-CM | POA: Diagnosis not present

## 2015-12-03 NOTE — Progress Notes (Signed)
Cardiology Office Note   Date:  12/03/2015   ID:  Scott Walter, DOB 1970/02/24, MRN [id number]  PCP:  Drema Pry, DO  Cardiologist:   Dorris Carnes, MD   F/U of HTN and PAF     History of Present Illness: Scott Walter is a 45 y.o. male with a history of PAF, HTN, ? TIA  I saw him in clnic in 2015  He was seen by Angelica Ran in 2016 SInce seen he says he has occasional palpitations  He attibutes to when has too much caffeine He wants to come off of as many meds as possible  Wants to get weight down, wants to exercise.    Outpatient Medications Prior to Visit  Medication Sig Dispense Refill  . atorvastatin (LIPITOR) 40 MG tablet TAKE 1 TABLET BY MOUTH EVERY DAY AT 6PM 30 tablet 5  . FLUoxetine (PROZAC) 20 MG capsule Take 20 mg by mouth daily.    [name] gabapentin (NEURONTIN) 300 MG capsule Take 300 mg by mouth every evening.    [name] ketotifen (ZADITOR) 0.025 % ophthalmic solution Place 1 drop into both eyes 2 (two) times daily as needed (allergies, dry eyes).    . metoprolol succinate (TOPROL-XL) 50 MG 24 hr tablet TAKE 1 TABLET BY MOUTH DAILY FOLLOWING A MEAL 30 tablet 0  . Probiotic Product (PROBIOTIC DAILY PO) Take 1 tablet by mouth daily.    . rivaroxaban (XARELTO) 20 MG TABS tablet TAKE 1 TABLET BY MOUTH EVERY DAY WITH SUPPER 30 tablet 2  . HYDROcodone-acetaminophen (NORCO/VICODIN) 5-325 MG tablet Take 2 tablets by mouth every 4 (four) hours as needed. (Patient not taking: Reported on 12/03/2015) 16 tablet 0  . hydrOXYzine (VISTARIL) 25 MG capsule Take 25 mg by mouth daily as needed. Itching    . methocarbamol (ROBAXIN) 500 MG tablet Take 1 tablet (500 mg total) by mouth 2 (two) times daily. (Patient not taking: Reported on 12/03/2015) 20 tablet 0  . naproxen sodium (ANAPROX) 220 MG tablet Take 220 mg by mouth 2 (two) times daily as needed (pain).     No facility-administered medications prior to visit.      Allergies:   Depakote [divalproex sodium] and Ambien [zolpidem tartrate]    Past Medical History:  Diagnosis Date  . Anxiety   . Bipolar disorder ([place])    h/o SI  . Chronic insomnia   . Chronic lower back pain   . Depression   . Dyslipidemia   . Essential hypertension   . ETOH abuse   . [id number])    "monthly" (01/26/2013)  . History of stomach ulcers ~ 2009  . Midsternal chest pain    a. 06/2014 Myoview: EF 50%, no ischemia/infarct.  . Migraine    "maybe 2 in my lifetime" (01/26/2013)  . PAF (paroxysmal atrial fibrillation) ([place])    a. Dx 05/2012;  b. 06/2014 Xarelto started in setting of ? TIA (CHA2DS2VASc = 3);  c. 06/2014 Echo: EF 60-65%, no rwma.  [name] PTSD (post-traumatic stress disorder)     Past Surgical History:  Procedure Laterality Date  . KNEE ARTHROSCOPY Right 09/2011     Social History:  The patient  reports that he quit smoking about 3 years ago. His smoking use included Cigarettes. He has a 30.00 pack-year smoking history. He has quit using smokeless tobacco. His smokeless tobacco use included Chew. He reports that he drinks alcohol. He reports that he does not use drugs.   Family History:  The patient's family history includes CAD  in his mother; Diabetes in his mother; Emphysema in his father; HIV in his brother; Healthy in his brother, brother, brother, brother, sister, sister, sister, sister, sister, sister, sister, and sister.    ROS:  Please see the history of present illness. All other systems are reviewed and  Negative to the above problem except as noted.    PHYSICAL EXAM: VS:  BP (!) 142/98   Pulse 77   Ht 5\' 9"  (1.753 m)   Wt 249 lb 12.8 oz (113.3 kg)   SpO2 98%   BMI 36.89 kg/m    BP on my check 130/100  GEN: Well nourished, well developed, in no acute distress  HEENT: normal  Neck: no JVD, carotid bruits, or masses Cardiac: RRR; no murmurs, rubs, or gallops,no edema  Respiratory:  clear to auscultation bilaterally, normal work of breathing GI: soft, nontender, nondistended, + BS  No hepatomegaly  MS: no  deformity Moving all extremities   Skin: warm and dry, no rash Neuro:  Strength and sensation are intact Psych: euthymic mood, full affect   EKG:  EKG is not ordered today.   Lipid Panel    Component Value Date/Time   CHOL 178 07/18/2014 2340   TRIG 202 (H) 07/18/2014 2340   HDL 40 (L) 07/18/2014 2340   CHOLHDL 4.5 07/18/2014 2340   VLDL 40 07/18/2014 2340   LDLCALC 98 07/18/2014 2340      Wt Readings from Last 3 Encounters:  12/03/15 249 lb 12.8 oz (113.3 kg)  06/13/15 264 lb 14.4 oz (120.2 kg)  10/30/14 257 lb (116.6 kg)      ASSESSMENT AND PLAN:  1  PAF  Pt currently in SR clinically  CHADSVASc score is 3 (if indeed he had a TIA in 2015, visual loss at time) I would keep on anticoagulation for now  2.  HTN  PT says his BP is usually better  Wants to come off of meds I recomm he taper metoprolol down to over two wks  F/U in clinic 2 wks after taht  Keep log of BPs I am not optimistic that he can come off of meds  3.  HL  They will forward labs that were drawn this summer at [facility]     Current medicines are reviewed at length with the patient today.  The patient does not have concerns regarding medicines.  Signed, Dorris Carnes, MD  12/03/2015 4:50 PM    [facility] [address], [place], [place]  [address] Phone: [phone]; Fax: [phone]

## 2015-12-03 NOTE — Patient Instructions (Addendum)
Your physician has recommended you make the following change in your medication:  METOPROLOL SUCCINATE (TOPROL XL) Take 25 mg (1/2 tablet) once a day for 1 week, then take 12.5 mg (1/4 tablet) once a day for a week then STOP.  Check your blood pressure at home and keep a record.  Bring the list with you to your next appointment.  Have your labs sent from the [place].  OK to fax to our office [phone], Attention Dr. Heron Nay   Your physician recommends that you schedule a follow-up appointment in: 4 weeks.

## 2015-12-16 ENCOUNTER — Other Ambulatory Visit: Payer: Self-pay | Admitting: Internal Medicine

## 2015-12-20 ENCOUNTER — Telehealth: Payer: Self-pay | Admitting: Cardiology

## 2015-12-20 NOTE — Telephone Encounter (Signed)
Closed encounter °

## 2015-12-27 ENCOUNTER — Telehealth: Payer: Self-pay | Admitting: *Deleted

## 2015-12-27 NOTE — Telephone Encounter (Signed)
Received copy of lab results addressed to Dr. [name] from Mail. Placed in Dr. [name] folder for her to review.

## 2016-01-06 NOTE — Progress Notes (Signed)
Cardiology Office Note   Date:  01/07/2016   ID:  Scott Walter, DOB [birth year], MRN [id number]  PCP:  Drema Pry, DO  Cardiologist:   Dorris Carnes, MD   F/U of HTN      History of Present Illness: Scott Walter is a 45 y.o. male with a history of PAF, HTN, ? TIA   I saw him in October    Pt wanted to come off of meds    I saw him in October  Occasional palpitations  BP ws up 142/98    Since seen he denies signif palptiations    Log of blood pressures at home 120s to 89 to 103   This is now off of meds  Pt just joined gym  Planning to lose a signif amount of weight in next 6 months   He does not want to take BP meds  Outpatient Medications Prior to Visit  Medication Sig Dispense Refill  . atorvastatin (LIPITOR) 40 MG tablet TAKE 1 TABLET BY MOUTH EVERY DAY AT 6PM 30 tablet 5  . ketotifen (ZADITOR) 0.025 % ophthalmic solution Place 1 drop into both eyes 2 (two) times daily as needed (allergies, dry eyes).    . Probiotic Product (PROBIOTIC DAILY PO) Take 1 tablet by mouth daily.    Alveda Reasons 20 MG TABS tablet TAKE 1 TABLET BY MOUTH EVERY DAY WITH SUPPER 30 tablet 11  . FLUoxetine (PROZAC) 20 MG capsule Take 20 mg by mouth daily.    [name] gabapentin (NEURONTIN) 300 MG capsule Take 300 mg by mouth every evening.     No facility-administered medications prior to visit.      Allergies:   Depakote [divalproex sodium] and Ambien [zolpidem tartrate]   Past Medical History:  Diagnosis Date  . Anxiety   . Bipolar disorder ([place])    h/o SI  . Chronic insomnia   . Chronic lower back pain   . Depression   . Dyslipidemia   . Essential hypertension   . ETOH abuse   . [id number])    "monthly" (01/26/2013)  . History of stomach ulcers ~ 2009  . Midsternal chest pain    a. 06/2014 Myoview: EF 50%, no ischemia/infarct.  . Migraine    "maybe 2 in my lifetime" (01/26/2013)  . PAF (paroxysmal atrial fibrillation) ([place])    a. Dx 05/2012;  b. 06/2014 Xarelto started in setting of ? TIA  (CHA2DS2VASc = 3);  c. 06/2014 Echo: EF 60-65%, no rwma.  [name] PTSD (post-traumatic stress disorder)     Past Surgical History:  Procedure Laterality Date  . KNEE ARTHROSCOPY Right 09/2011     Social History:  The patient  reports that he quit smoking about 4 years ago. His smoking use included Cigarettes. He has a 30.00 pack-year smoking history. He has quit using smokeless tobacco. His smokeless tobacco use included Chew. He reports that he drinks alcohol. He reports that he does not use drugs.   Family History:  The patient's family history includes CAD in his mother; Diabetes in his mother; Emphysema in his father; HIV in his brother; Healthy in his brother, brother, brother, brother, sister, sister, sister, sister, sister, sister, sister, and sister.    ROS:  Please see the history of present illness. All other systems are reviewed and  Negative to the above problem except as noted.    PHYSICAL EXAM: VS:  BP (!) 144/84   Pulse 83   Ht 5\' 9"  (1.753 m)   Wt 259  lb 9.6 oz (117.8 kg)   SpO2 97%   BMI 38.34 kg/m   GEN: Well nourished, well developed, in no acute distress  HEENT: normal  Neck: no JVD, carotid bruits, or masses Cardiac: RRR; no murmurs, rubs, or gallops,no edema  Respiratory:  clear to auscultation bilaterally, normal work of breathing GI: soft, nontender, nondistended, + BS  No hepatomegaly  MS: no deformity Moving all extremities   Skin: warm and dry, no rash Neuro:  Strength and sensation are intact Psych: euthymic mood, full affect   EKG:  EKG is not ordered today.   Lipid Panel    Component Value Date/Time   CHOL 178 07/18/2014 2340   TRIG 202 (H) 07/18/2014 2340   HDL 40 (L) 07/18/2014 2340   CHOLHDL 4.5 07/18/2014 2340   VLDL 40 07/18/2014 2340   LDLCALC 98 07/18/2014 2340      Wt Readings from Last 3 Encounters:  01/07/16 259 lb 9.6 oz (117.8 kg)  12/03/15 249 lb 12.8 oz (113.3 kg)  06/13/15 264 lb 14.4 oz (120.2 kg)      ASSESSMENT AND  PLAN:  1  HTN   BP is not well controlled  I discussed with him  He does not want to take meds    I told him It was ultimately his decision  Discussed long term consequences of uncontrolled bp   Will follow  2.  PAF  No signif spells  Keep on current regimen     3.  HL  LDL on recent check at [facility] was 32  Told him to cut back on lipitor to 20 mg every other day.    Encouraged him to get wt down    I will set f/u for next spring/summer     Current medicines are reviewed at length with the patient today.  The patient does not have concerns regarding medicines.  Signed, Dorris Carnes, MD  01/07/2016 11:43 AM    [facility] [address], [place], [place]  [address] Phone: [phone]; Fax: [phone]

## 2016-01-07 ENCOUNTER — Ambulatory Visit: Payer: Federal, State, Local not specified - PPO | Admitting: Internal Medicine

## 2016-01-07 ENCOUNTER — Ambulatory Visit (INDEPENDENT_AMBULATORY_CARE_PROVIDER_SITE_OTHER): Payer: Federal, State, Local not specified - PPO | Admitting: Internal Medicine

## 2016-01-07 ENCOUNTER — Encounter: Payer: Self-pay | Admitting: Internal Medicine

## 2016-01-07 VITALS — BP 144/84 | HR 83 | Ht 69.0 in | Wt 259.6 lb

## 2016-01-07 DIAGNOSIS — I10 Essential (primary) hypertension: Secondary | ICD-10-CM

## 2016-01-07 DIAGNOSIS — E785 Hyperlipidemia, unspecified: Secondary | ICD-10-CM

## 2016-01-07 DIAGNOSIS — I480 Paroxysmal atrial fibrillation: Secondary | ICD-10-CM

## 2016-01-07 MED ORDER — ATORVASTATIN CALCIUM 40 MG PO TABS: 20.0000 mg | ORAL_TABLET | ORAL | 5 refills | Status: DC

## 2016-01-07 NOTE — Patient Instructions (Signed)
Your physician has recommended you make the following change in your medication:  1.) change Lipitor (atorvastatin) to 20 mg every other day  Your physician wants you to follow-up in: 6 months with Dr. [name].  You will receive a reminder letter in the mail two months in advance. If you don't receive a letter, please call our office to schedule the follow-up appointment.

## 2016-02-04 ENCOUNTER — Telehealth: Payer: Self-pay | Admitting: Internal Medicine

## 2016-02-04 NOTE — Telephone Encounter (Signed)
Pt wife is calling to speak with a nurse about continuing his BP medication

## 2016-02-05 NOTE — Telephone Encounter (Signed)
Left message for patient's wife to call back.  

## 2016-02-05 NOTE — Telephone Encounter (Signed)
Spoke with patient's wife. Pt started new job with [employer]. They have recommended that he get back on metoprolol and xarelto.  Pt had stopped both because he did not want to take medicines.  He started feeling run down, BP was going up.  He decided to start back on Toprol XL 50 mg and Xarelto 20 mg daily.  Would like Dr. [name] to reorder.  Advised once MD reviews [facility] will [facility].

## 2016-02-06 ENCOUNTER — Encounter: Payer: Self-pay | Admitting: *Deleted

## 2016-02-06 MED ORDER — METOPROLOL SUCCINATE ER 50 MG PO TB24: 50.0000 mg | ORAL_TABLET | Freq: Every day | ORAL | 3 refills | Status: DC

## 2016-02-06 NOTE — Telephone Encounter (Signed)
Agree  I would resume  Have pt keep log of BPs

## 2016-02-06 NOTE — Telephone Encounter (Signed)
xarelto is still active medication with refills. Sent prescription for Toprol XL 50 mg one daily to pharmacy and sent My Chart message to keep a list of BP readings.

## 2016-02-19 DIAGNOSIS — G4733 Obstructive sleep apnea (adult) (pediatric): Secondary | ICD-10-CM | POA: Diagnosis not present

## 2016-03-06 DIAGNOSIS — G4733 Obstructive sleep apnea (adult) (pediatric): Secondary | ICD-10-CM | POA: Diagnosis not present

## 2016-03-11 DIAGNOSIS — G4733 Obstructive sleep apnea (adult) (pediatric): Secondary | ICD-10-CM | POA: Diagnosis not present

## 2016-04-21 DIAGNOSIS — J029 Acute pharyngitis, unspecified: Secondary | ICD-10-CM | POA: Diagnosis not present

## 2016-04-21 DIAGNOSIS — B349 Viral infection, unspecified: Secondary | ICD-10-CM | POA: Diagnosis not present

## 2016-04-29 ENCOUNTER — Other Ambulatory Visit: Payer: Self-pay

## 2016-04-29 ENCOUNTER — Emergency Department (HOSPITAL_COMMUNITY): Payer: Non-veteran care

## 2016-04-29 ENCOUNTER — Emergency Department (HOSPITAL_COMMUNITY)
Admission: EM | Admit: 2016-04-29 | Discharge: 2016-04-30 | Disposition: A | Discharge status: Home / Self Care | Payer: Non-veteran care | Attending: Emergency Medicine | Admitting: Emergency Medicine

## 2016-04-29 ENCOUNTER — Encounter (HOSPITAL_COMMUNITY): Payer: Self-pay | Admitting: Emergency Medicine

## 2016-04-29 DIAGNOSIS — Z7901 Long term (current) use of anticoagulants: Secondary | ICD-10-CM | POA: Diagnosis not present

## 2016-04-29 DIAGNOSIS — Z8673 Personal history of transient ischemic attack (TIA), and cerebral infarction without residual deficits: Secondary | ICD-10-CM | POA: Diagnosis not present

## 2016-04-29 DIAGNOSIS — F17210 Nicotine dependence, cigarettes, uncomplicated: Secondary | ICD-10-CM | POA: Insufficient documentation

## 2016-04-29 DIAGNOSIS — I10 Essential (primary) hypertension: Secondary | ICD-10-CM | POA: Diagnosis not present

## 2016-04-29 DIAGNOSIS — Z79899 Other long term (current) drug therapy: Secondary | ICD-10-CM | POA: Insufficient documentation

## 2016-04-29 DIAGNOSIS — R079 Chest pain, unspecified: Secondary | ICD-10-CM | POA: Diagnosis not present

## 2016-04-29 DIAGNOSIS — R0789 Other chest pain: Secondary | ICD-10-CM | POA: Diagnosis not present

## 2016-04-29 LAB — CBC
HEMATOCRIT: 44.6 % (ref 39.0–52.0)
Hemoglobin: 15.2 g/dL (ref 13.0–17.0)
MCH: 30.4 pg (ref 26.0–34.0)
MCHC: 34.1 g/dL (ref 30.0–36.0)
MCV: 89.2 fL (ref 78.0–100.0)
PLATELETS: 218 10*3/uL (ref 150–400)
RBC: 5 MIL/uL (ref 4.22–5.81)
RDW: 14.8 % (ref 11.5–15.5)
WBC: 12.7 10*3/uL — ABNORMAL HIGH (ref 4.0–10.5)

## 2016-04-29 LAB — I-STAT TROPONIN, ED: TROPONIN I, POC: 0 ng/mL (ref 0.00–0.08)

## 2016-04-29 LAB — BASIC METABOLIC PANEL
Anion gap: 10 (ref 5–15)
BUN: 9 mg/dL (ref 6–20)
CALCIUM: 9.4 mg/dL (ref 8.9–10.3)
CO2: 26 mmol/L (ref 22–32)
CREATININE: 0.87 mg/dL (ref 0.61–1.24)
Chloride: 104 mmol/L (ref 101–111)
GFR calc Af Amer: >60 mL/min (ref >60)
Glucose, Bld: 107 mg/dL — ABNORMAL HIGH (ref 65–99)
POTASSIUM: 4.7 mmol/L (ref 3.5–5.1)
SODIUM: 140 mmol/L (ref 135–145)

## 2016-04-29 LAB — TROPONIN I: Troponin I: <0.03 ng/mL (ref <0.03)

## 2016-04-29 LAB — LIPASE, BLOOD: Lipase: 30 U/L (ref 11–51)

## 2016-04-29 LAB — RAPID STREP SCREEN (MED CTR MEBANE ONLY): STREPTOCOCCUS, GROUP A SCREEN (DIRECT): NEGATIVE

## 2016-04-29 MED ORDER — GI COCKTAIL ~~LOC~~
30.0000 mL | Freq: Once | ORAL | Status: AC
  Administered 2016-04-29: 30 mL via ORAL
  Filled 2016-04-29: qty 30

## 2016-04-29 NOTE — ED Provider Notes (Addendum)
[facility] DEPT Provider Note   CSN: [id number] Arrival date & time: 04/29/16  1834     History   Chief Complaint Chief Complaint  Patient presents with  . Chest Pain    HPI Scott Walter is a 46 y.o. male.  HPI Patient presents with 2 days of discomfort. Patient was well until yesterday, when he had one episode of sternal discomfort, brief, sharp, nonsustained. No clear precipitant. Today, the patient has had additional episodes of chest pain, though none currently. There is associated generalized discomfort, fatigue, weakness, as well as soreness in the back of his throat. Patient has had no relief with anything, attempting home medication as well as drinking plenty of fluids. Patient has history of multiple medical issues, including A. fib, for which he takes Xarelto. Patient has history of prior TIA. History provided by the patient and his wife  He recently started smoking again after a period of abstinence.  Past Medical History:  Diagnosis Date  . Anxiety   . Bipolar disorder ([place])    h/o SI  . Chronic insomnia   . Chronic lower back pain   . Depression   . Dyslipidemia   . Essential hypertension   . ETOH abuse   . YTKZSWFU(932.3)    "monthly" (01/26/2013)  . History of stomach ulcers ~ 2009  . Midsternal chest pain    a. 06/2014 Myoview: EF 50%, no ischemia/infarct.  . Migraine    "maybe 2 in my lifetime" (01/26/2013)  . PAF (paroxysmal atrial fibrillation) ([place])    a. Dx 05/2012;  b. 06/2014 Xarelto started in setting of ? TIA (CHA2DS2VASc = 3);  c. 06/2014 Echo: EF 60-65%, no rwma.  [name] PTSD (post-traumatic stress disorder)     Patient Active Problem List   Diagnosis Date Noted  . Preventative health care 09/06/2014  . Testicular discomfort 09/06/2014  . Low back pain 09/06/2014  . Dyslipidemia   . Chest pain 07/18/2014  . Visual disturbance 07/18/2014  . Paroxysmal atrial fibrillation ([place]) 07/18/2014  . Chest pain with moderate risk for cardiac  etiology 07/18/2014  . Amaurosis fugax of right eye 07/18/2014  . TIA (transient ischemic attack)   . Shortness of breath 03/02/2013  . Chronic insomnia 03/02/2013  . Bipolar disorder ([place]) 03/02/2013  . Precordial pain 01/26/2013  . Hyperglycemia 01/26/2013  . Essential hypertension 06/12/2012  . ETOH abuse 06/12/2012    Past Surgical History:  Procedure Laterality Date  . KNEE ARTHROSCOPY Right 09/2011       Home Medications    Prior to Admission medications   Medication Sig Start Date End Date Taking? Authorizing Provider  cetirizine (ZYRTEC) 10 MG tablet Take 10 mg by mouth daily.   Yes Historical Provider, MD  chlorpheniramine-HYDROcodone (TUSSIONEX PENNKINETIC ER) 10-8 MG/5ML SUER Take 5 mLs by mouth at bedtime as needed for cough.   Yes Historical Provider, MD  ipratropium (ATROVENT) 0.06 % nasal spray Place 2 sprays into both nostrils 4 (four) times daily.   Yes Historical Provider, MD  ketotifen (ZADITOR) 0.025 % ophthalmic solution Place 1 drop into both eyes 2 (two) times daily as needed (allergies, dry eyes).   Yes Historical Provider, MD  metoprolol succinate (TOPROL-XL) 50 MG 24 hr tablet Take 1 tablet (50 mg total) by mouth daily. Take with or immediately following a meal. 02/06/16 05/06/16 Yes Fay Records, MD  Multiple Vitamin (MULTIVITAMIN WITH MINERALS) TABS tablet Take 1 tablet by mouth daily.   Yes Historical Provider, MD  Omega-3 Fatty Acids (  OMEGA 3 PO) Take 1 capsule by mouth daily.   Yes Historical Provider, MD  Probiotic Product (PROBIOTIC DAILY PO) Take 1 tablet by mouth daily.   Yes Historical Provider, MD  XARELTO 20 MG TABS tablet TAKE 1 TABLET BY MOUTH EVERY DAY WITH SUPPER Patient taking differently: takes 20mg  by mouth once in morning 12/17/15  Yes Fay Records, MD  atorvastatin (LIPITOR) 40 MG tablet Take 0.5 tablets (20 mg total) by mouth every other day. Patient not taking: Reported on 04/29/2016 01/07/16   Fay Records, MD  azithromycin  (ZITHROMAX) 250 MG tablet Take 250 mg by mouth daily.    Historical Provider, MD    Family History Family History  Problem Relation Age of Onset  . CAD Mother   . Diabetes Mother   . Emphysema Father   . Healthy Sister   . Healthy Brother   . Healthy Sister   . Healthy Sister   . Healthy Sister   . Healthy Sister   . Healthy Sister   . Healthy Sister   . Healthy Sister   . Healthy Brother   . Healthy Brother   . Healthy Brother   . HIV Brother     Social History Social History  Substance Use Topics  . Smoking status: Current Every Day Smoker    Packs/day: 0.75    Years: 30.00    Types: Cigarettes    Last attempt to quit: 01/03/2012  . Smokeless tobacco: Former Systems developer    Types: Chew     Comment: 01/26/2013 "quit chewing > 20 yr ago"  . Alcohol use Yes     Comment: occ     Allergies   Ambien [zolpidem tartrate] and Depakote [divalproex sodium]   Review of Systems Review of Systems  Constitutional:       Per HPI, otherwise negative  HENT:       Per HPI, otherwise negative  Respiratory:       Per HPI, otherwise negative  Cardiovascular:       Per HPI, otherwise negative  Gastrointestinal: Negative for vomiting.  Endocrine:       Negative aside from HPI  Genitourinary:       Neg aside from HPI   Musculoskeletal:       Per HPI, otherwise negative  Skin: Negative.   Neurological: Negative for syncope.     Physical Exam Updated Vital Signs BP 138/99   Pulse 94   Temp 98.2 F (36.8 C) (Oral)   Resp 18   SpO2 98%   Physical Exam  Constitutional: He is oriented to person, place, and time. He appears well-developed. No distress.  HENT:  Head: Normocephalic and atraumatic.  Mild erythema, worse on the left posterior oral pharyngeal surface. Mild tenderness to palpation, bilateral maxilla  Eyes: Conjunctivae and EOM are normal.  Cardiovascular: Normal rate and regular rhythm.   Pulmonary/Chest: Effort normal. No stridor. No respiratory distress.    Abdominal: He exhibits no distension.  Musculoskeletal: He exhibits no edema.  Neurological: He is alert and oriented to person, place, and time.  Skin: Skin is warm and dry.  Psychiatric: He has a normal mood and affect.  Nursing note and vitals reviewed.    ED Treatments / Results  Labs (all labs ordered are listed, but only abnormal results are displayed) Labs Reviewed  BASIC METABOLIC PANEL - Abnormal; Notable for the following:       Result Value   Glucose, Bld 107 (*)    All  other components within normal limits  CBC - Abnormal; Notable for the following:    WBC 12.7 (*)    All other components within normal limits  RAPID STREP SCREEN (NOT AT [facility])  CULTURE, GROUP A STREP [facility])  LIPASE, BLOOD  TROPONIN I  I-STAT TROPOININ, ED    EKG  EKG Interpretation  Date/Time:  Tuesday April 29 2016 18:42:05 EDT Ventricular Rate:  94 PR Interval:  138 QRS Duration: 90 QT Interval:  358 QTC Calculation: 447 R Axis:   34 Text Interpretation:  Normal sinus rhythm Normal ECG Normal ECG Confirmed by Carmin Muskrat  MD ([id number]) on 04/30/2016 12:05:27 AM       Radiology Dg Chest 2 View  Result Date: 04/29/2016 CLINICAL DATA:  Chest pain EXAM: CHEST  2 VIEW COMPARISON:  Chest radiograph 06/13/2015 FINDINGS: Cardiomediastinal silhouette remains enlarged, but unchanged. The lungs are clear. No pleural effusion or pneumothorax. IMPRESSION: Cardiomegaly, unchanged, without pulmonary edema. Electronically Signed   By: Ulyses Jarred M.D.   On: 04/29/2016 19:53    Procedures Procedures (including critical care time)  Medications Ordered in ED Medications  gi cocktail (Maalox,Lidocaine,Donnatal) (not administered)     Initial Impression / Assessment and Plan / ED Course  I have reviewed the triage vital signs and the nursing notes.  Pertinent labs & imaging results that were available during my care of the patient were reviewed by me and considered in my medical decision making  (see chart for details).  On repeat exam patient is awake and alert, in no distress I had a very lengthy conversation with him and his wife about 2 nights findings including 2 negative troponins, unremarkable EKG, normal chest x-ray, and little suspicion for PE, ACS, pneumonia. Patient continues to take appropriate medication, appropriate prophylaxis for stroke, ACS. They note that patient has recent episode of sinus congestion illness, recently completed a course of medication, and that he also has seasonal allergies. We discussed.  Initiation of additional therapy for pharyngitis, follow-up with primary care in 3 days for repeat evaluation, and with ENT as needed.   Final Clinical Impressions(s) / ED Diagnoses  Chest pain   Carmin Muskrat, MD 04/30/16 0023    Carmin Muskrat, MD 05/05/16 [phone]

## 2016-04-29 NOTE — ED Triage Notes (Addendum)
Pt c/o substernal CP and pressure since yesterday, accompanied by intermittent SOB, dizziness, N/V (vomited 4-5 times yesterday to the point he saw a little blood). States he feels the pain radiate to the L arm and R leg, but it feels like a numbness. A&O x 4, resp e/u, skin warm/dry.

## 2016-04-30 MED ORDER — CLINDAMYCIN HCL 150 MG PO CAPS: 300.0000 mg | ORAL_CAPSULE | Freq: Three times a day (TID) | ORAL | 0 refills | Status: AC

## 2016-04-30 MED ORDER — CLINDAMYCIN HCL 150 MG PO CAPS
300.0000 mg | ORAL_CAPSULE | Freq: Once | ORAL | Status: AC
  Administered 2016-04-30: 300 mg via ORAL
  Filled 2016-04-30: qty 2

## 2016-04-30 NOTE — ED Notes (Signed)
Pt stable, understands discharge instructions, and reasons for return.   

## 2016-05-02 LAB — CULTURE, GROUP A STREP (THRC)

## 2016-05-09 ENCOUNTER — Telehealth: Payer: Self-pay | Admitting: Internal Medicine

## 2016-05-09 NOTE — Telephone Encounter (Signed)
Please call,question about his Metoprolol. °

## 2016-05-12 ENCOUNTER — Encounter: Payer: Self-pay | Admitting: Internal Medicine

## 2016-05-12 NOTE — Telephone Encounter (Signed)
Patient's wife would like to know if pt can switch from Toprol XL to Bisoprolol.  His friend had better results and it also helped his friend's anxiety, which the patient suffers from, according to spouse.  She did not have info about current BPs with her at this time (not home) but will send through [place].   She is aware I will forward to Dr. [name] and call back with recommendations.  Pt due for next appointment this May, per recall.

## 2016-05-13 NOTE — Telephone Encounter (Signed)
That is fine  I am not sure there will be difference He could try 2.5 mg Bystolc See if there are samples (cut 5 even in 1/2)

## 2016-05-22 NOTE — Telephone Encounter (Signed)
f/u Message ° °Pt wife returning RN call. Please call back to discuss °

## 2016-05-22 NOTE — Telephone Encounter (Signed)
Reviewed with Tana Coast, PharmD.  If switching from Toprol XL 50 mg to bisoprolol, the dose for bisoprolol would be 5 mg once daily and it comes in generic.    Left message for patient's wife to call back.

## 2016-05-23 MED ORDER — BISOPROLOL FUMARATE 5 MG PO TABS: 5.0000 mg | ORAL_TABLET | Freq: Every day | ORAL | 3 refills | Status: DC

## 2016-05-23 NOTE — Telephone Encounter (Signed)
Patient's wife called back.  Informed that we can switch pt from Toprol XL to bisoprolol 5 mg daily.  Advised that per Dr. Farrel Gobble may be more effective for BP but to switch to bisoprolol for now, monitor BP.

## 2016-06-15 IMAGING — MR MR MRA HEAD W/O CM
9 of 12 series · 30 of 48 positions shown · non-contrast
Comparison: Head CT earlier today

ADDENDUM:
Clinical data section should read: "Episode of substernal chest pain
this morning with loss of vision in the left eye lasting several
minutes and occurring twice. History of atrial fibrillation not on
anticoagulation."
CLINICAL DATA: Episode of substernal chest pain this morning with
loss of vision in the left eye lasting several min in occurring
[REDACTED]. History atrial fibrillation not on anticoagulation.

EXAM:
MRI HEAD WITHOUT CONTRAST
MRA HEAD WITHOUT CONTRAST
TECHNIQUE: Multiplanar, multiecho pulse sequences of the brain and surrounding
structures were obtained without intravenous contrast. Angiographic
images of the head were obtained using MRA technique without
contrast.

[Series 2: FLAIR · sagittal · 5.0mm · 0.47mm/px · 2 of 23 slices shown (1 of 2)]
[im 1/23]
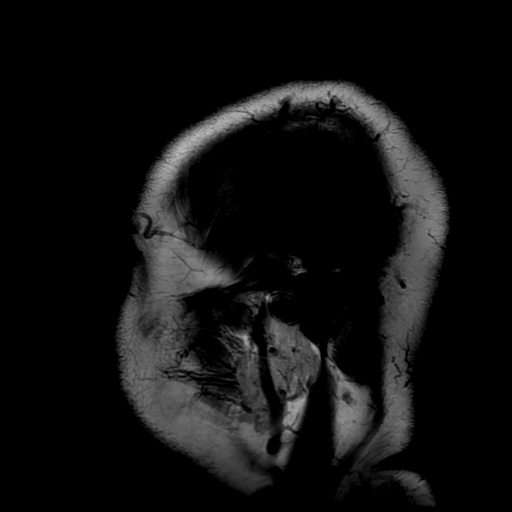
[im 23/23]
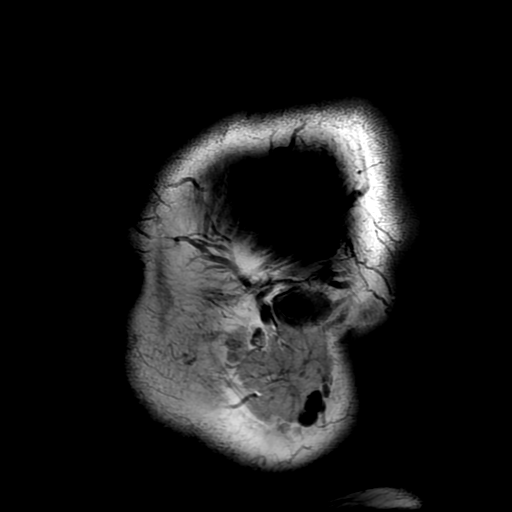

[Series 4: DWI · axial · 3.0mm · 0.94mm/px · z∈[-78,+71]mm · 7 of 102 slices shown (1 of 4)]
[im 1/102]
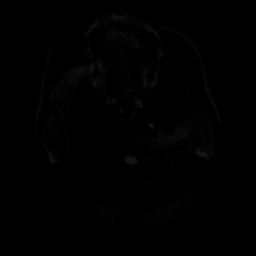
[im 17/102]
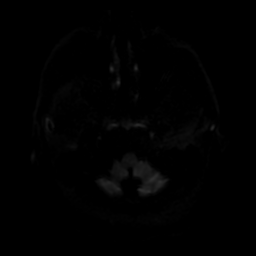
[im 34/102]
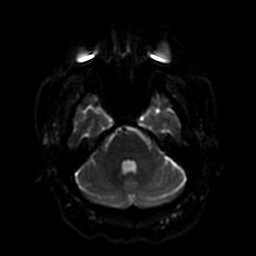
[im 51/102]
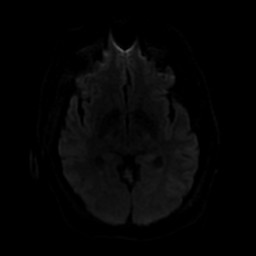
[im 68/102]
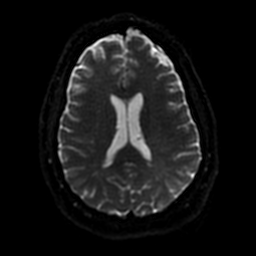
[im 85/102]
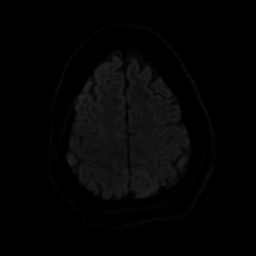
[im 102/102]
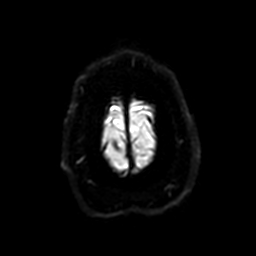

[Series 5: ax (id) 2 · axial · 1.2mm · 0.43mm/px · z∈[-89,-4]mm · 6 of 200 slices shown]
[im 1/200]
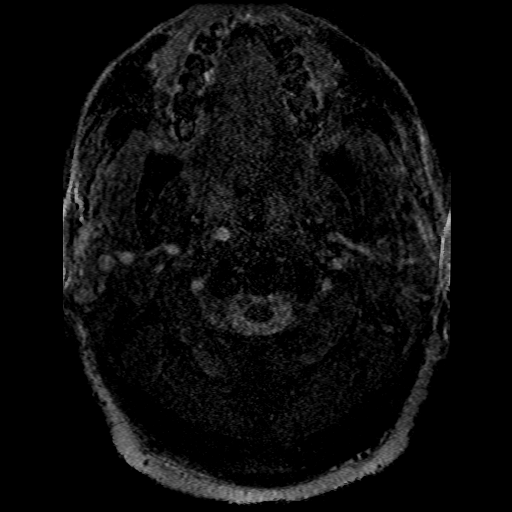
[im 37/200]
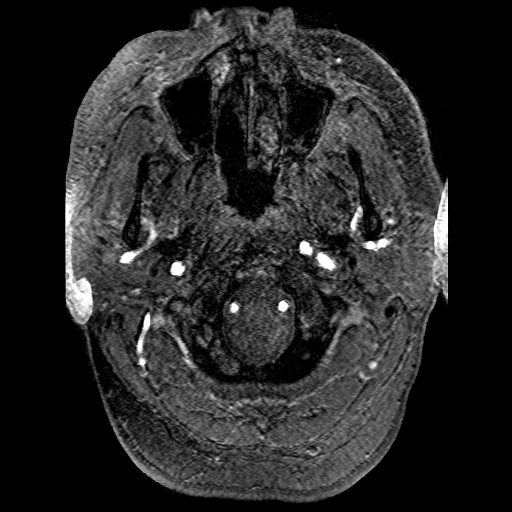
[im 55/200]
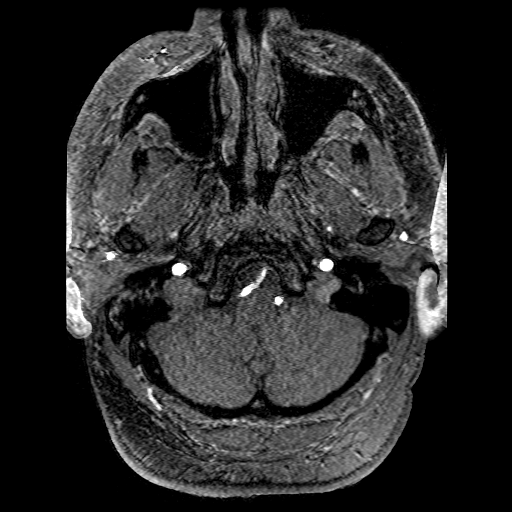
[im 91/200]
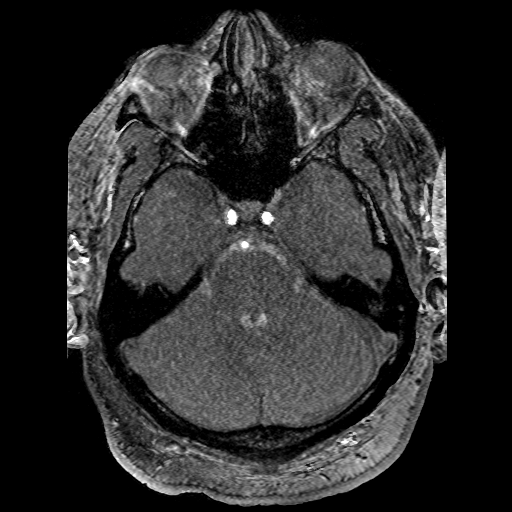
[im 109/200]
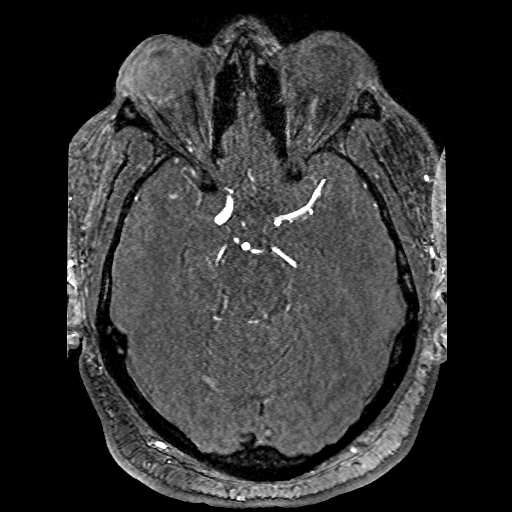
[im 145/200]
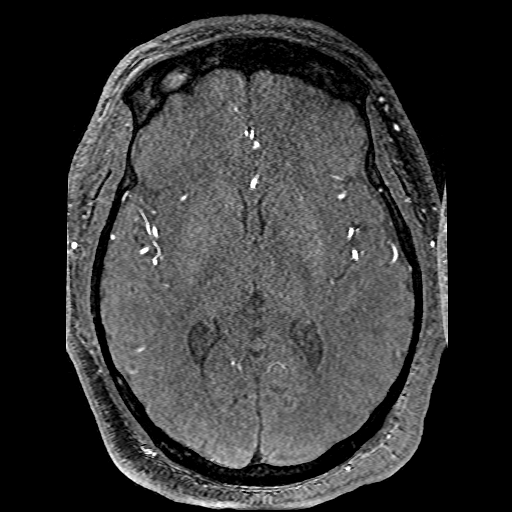

[Series 6: T2 · axial · 5.0mm · 0.47mm/px · z∈[-78,+71]mm · 2 of 26 slices shown (1 of 2)]
[im 1/26]
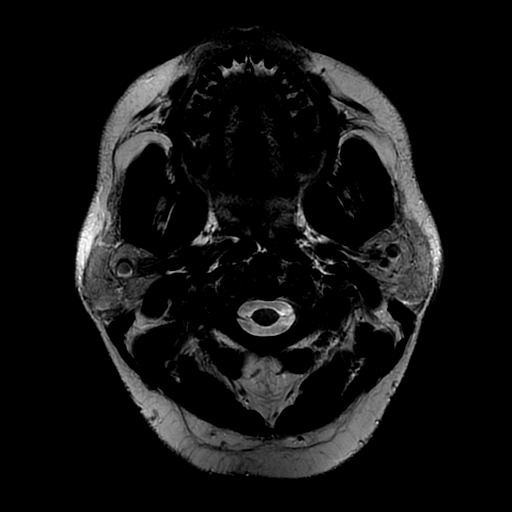
[im 26/26]
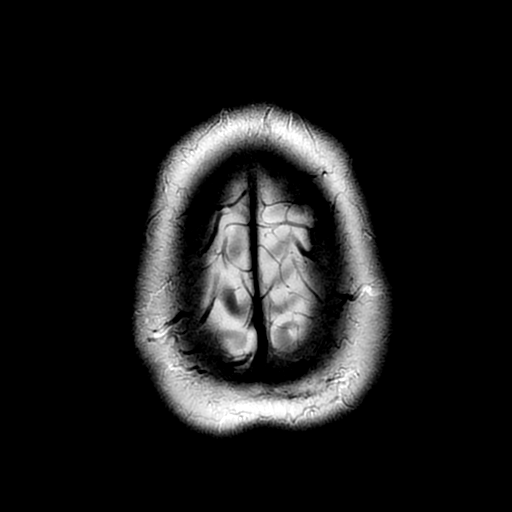

[Series 7: FLAIR · axial · 5.0mm · 0.47mm/px · z∈[-78,+71]mm · 2 of 26 slices shown (2 of 2)]
[im 1/26]
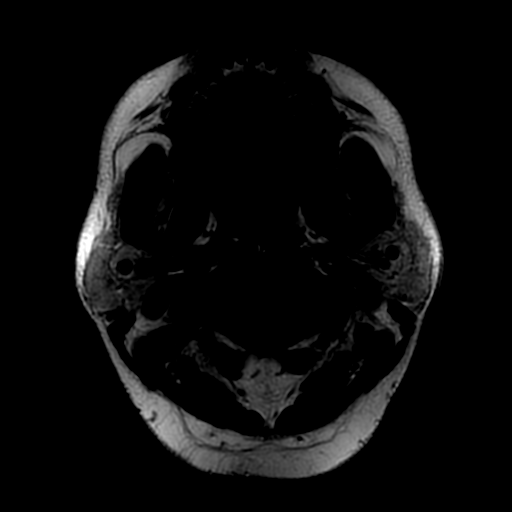
[im 26/26]
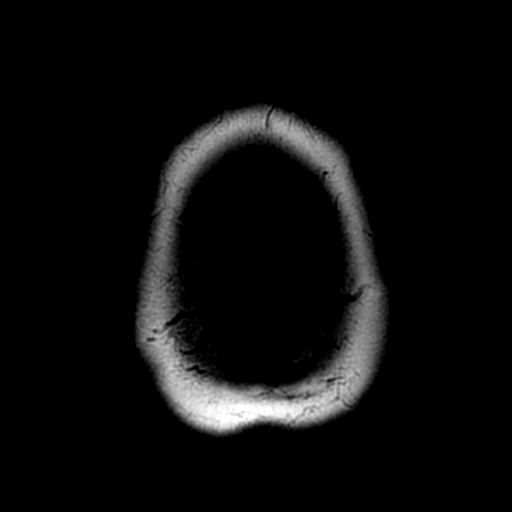

[Series 8: DWI · coronal · 5.0mm · 0.94mm/px · 4 of 70 slices shown (2 of 4)]
[im 1/70]
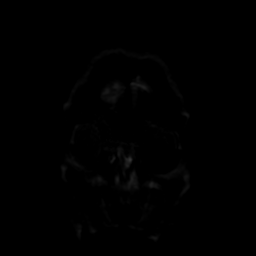
[im 24/70]
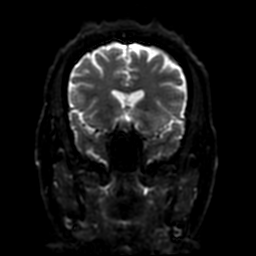
[im 47/70]
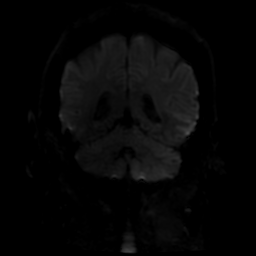
[im 70/70]
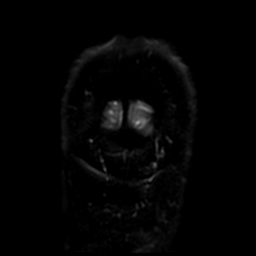

[Series 11: T2 · coronal · 5.0mm · 0.47mm/px · 2 of 30 slices shown (2 of 2)]
[im 1/30]
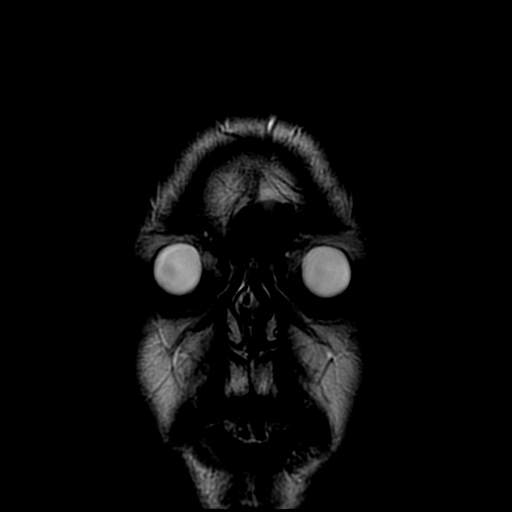
[im 30/30]
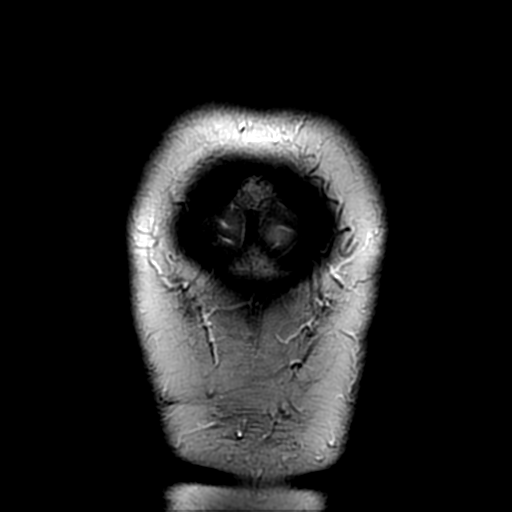

[Series 400: DWI · axial · 3.0mm · 0.94mm/px · z∈[-78,+71]mm · 3 of 51 slices shown (3 of 4)]
[im 1/51]
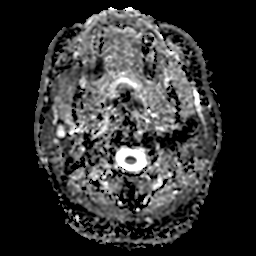
[im 26/51]
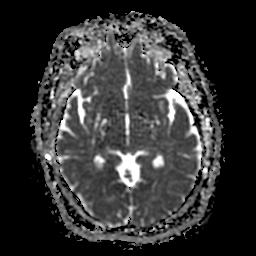
[im 51/51]
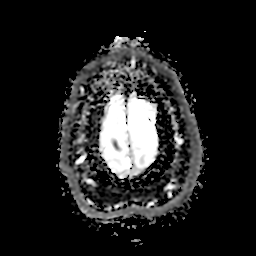

[Series 800: DWI · coronal · 5.0mm · 0.94mm/px · 2 of 35 slices shown (4 of 4)]
[im 1/35]
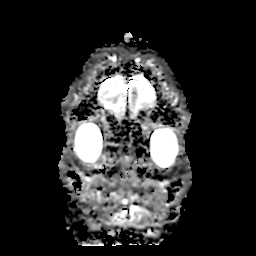
[im 35/35]
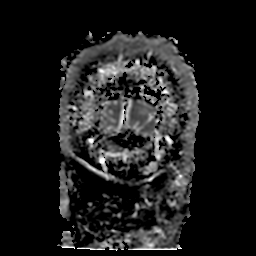

[30 of 48 positions shown; findings below may reference images not displayed]

FINDINGS: MRI HEAD FINDINGS

There is no evidence of acute infarct, intracranial hemorrhage,
mass, midline shift, or extra-axial fluid collection. Ventricles and
sulci are upper limits of normal to minimally prominent in size for
age, suggestive of very mild cerebral atrophy.

Orbits are unremarkable. Paranasal sinuses and mastoid air cells are
clear. Major intracranial vascular flow voids are preserved.

MRA HEAD FINDINGS

The visualized distal vertebral arteries are patent with the left
being minimally larger than the right. AICA and SCA origins are
patent. Basilar artery is patent without stenosis. Focally
diminished signal in the mid basilar artery is artifactual. There
are small, patent posterior communicating arteries bilaterally. PCAs
are unremarkable.

The visualized distal cervical ICA on the left is tortuous.
Intracranial ICAs are patent bilaterally without stenosis. Anterior
communicating artery is patent. A median artery of the corpus
callosum is incidentally noted. ACAs and MCAs are otherwise
unremarkable. No intracranial aneurysm is identified.
IMPRESSION: 1. No acute intracranial abnormality.
2. Very mild cerebral atrophy, otherwise unremarkable appearance of
the brain.
3. Negative head MRA.

## 2016-07-20 NOTE — Progress Notes (Signed)
Cardiology Office Note   Date:  07/21/2016   ID:  Scott Walter, DOB [birth year], MRN [id number]  PCP:  Rosine Abe, DO  Cardiologist:   Dorris Carnes, MD   F/U of PAF     History of Present Illness: Scott Walter is a 46 y.o. male with a history ofPAF, HTN ? TIA  I saw him in Novmeber 2017   Pt was seen in [facility] ED in March for CP  He describes as a pressure  Soreness in back of throat  No associated with activity  COughing a lot    Pt admits he is smoking again  Denies palpitations      Current Meds  Medication Sig  . atorvastatin (LIPITOR) 40 MG tablet Take 0.5 tablets (20 mg total) by mouth every other day.  [name] azithromycin (ZITHROMAX) 250 MG tablet Take 250 mg by mouth daily.  . bisoprolol (ZEBETA) 5 MG tablet Take 1 tablet (5 mg total) by mouth daily.  . cetirizine (ZYRTEC) 10 MG tablet Take 10 mg by mouth daily.  . chlorpheniramine-HYDROcodone (TUSSIONEX PENNKINETIC ER) 10-8 MG/5ML SUER Take 5 mLs by mouth at bedtime as needed for cough.  [name] ipratropium (ATROVENT) 0.06 % nasal spray Place 2 sprays into both nostrils 4 (four) times daily.  [name] ketotifen (ZADITOR) 0.025 % ophthalmic solution Place 1 drop into both eyes 2 (two) times daily as needed (allergies, dry eyes).  . Multiple Vitamin (MULTIVITAMIN WITH MINERALS) TABS tablet Take 1 tablet by mouth daily.  . Omega-3 Fatty Acids (OMEGA 3 PO) Take 1 capsule by mouth daily.  . Probiotic Product (PROBIOTIC DAILY PO) Take 1 tablet by mouth daily.  Alveda Reasons 20 MG TABS tablet TAKE 1 TABLET BY MOUTH EVERY DAY WITH SUPPER (Patient taking differently: takes 20mg  by mouth once in morning)     Allergies:   Ambien [zolpidem tartrate] and Depakote [divalproex sodium]   Past Medical History:  Diagnosis Date  . Anxiety   . Bipolar disorder ([place])    h/o SI  . Chronic insomnia   . Chronic lower back pain   . Depression   . Dyslipidemia   . Essential hypertension   . ETOH abuse   . IRJJOACZ(660.6)    "monthly" (01/26/2013)    . History of stomach ulcers ~ 2009  . Midsternal chest pain    a. 06/2014 Myoview: EF 50%, no ischemia/infarct.  . Migraine    "maybe 2 in my lifetime" (01/26/2013)  . PAF (paroxysmal atrial fibrillation) ([place])    a. Dx 05/2012;  b. 06/2014 Xarelto started in setting of ? TIA (CHA2DS2VASc = 3);  c. 06/2014 Echo: EF 60-65%, no rwma.  [name] PTSD (post-traumatic stress disorder)     Past Surgical History:  Procedure Laterality Date  . KNEE ARTHROSCOPY Right 09/2011     Social History:  The patient  reports that he has been smoking Cigarettes.  He has a 22.50 pack-year smoking history. He has quit using smokeless tobacco. His smokeless tobacco use included Chew. He reports that he drinks alcohol. He reports that he does not use drugs.   Family History:  The patient's family history includes CAD in his mother; Diabetes in his mother; Emphysema in his father; HIV in his brother; Healthy in his brother, brother, brother, brother, sister, sister, sister, sister, sister, sister, sister, and sister.    ROS:  Please see the history of present illness. All other systems are reviewed and  Negative to the above problem except as noted.  PHYSICAL EXAM: VS:  BP 128/84   Pulse 68   Ht 5\' 9"  (1.753 m)   Wt 115.9 kg (255 lb 9.6 oz)   SpO2 97%   BMI 37.75 kg/m   GEN: Morbidly obese 46 yo , in no acute distress  HEENT: normal  Neck: no JVD, carotid bruits, or masses Cardiac: RRR; no murmurs, rubs, or gallops,no edema  Respiratory:  Upper airway wheeze  Lower airways clear to auscultation bilaterally, normal work of breathing GI: soft, nontender, nondistended, + BS  No hepatomegaly  MS: no deformity Moving all extremities   Skin: warm and dry, no rash Neuro:  Strength and sensation are intact Psych: euthymic mood, full affect   EKG:  EKG is not  ordered today.   Lipid Panel    Component Value Date/Time   CHOL 178 07/18/2014 2340   TRIG 202 (H) 07/18/2014 2340   HDL 40 (L) 07/18/2014 2340    CHOLHDL 4.5 07/18/2014 2340   VLDL 40 07/18/2014 2340   LDLCALC 98 07/18/2014 2340      Wt Readings from Last 3 Encounters:  07/21/16 115.9 kg (255 lb 9.6 oz)  01/07/16 117.8 kg (259 lb 9.6 oz)  12/03/15 113.3 kg (249 lb 12.8 oz)      ASSESSMENT AND PLAN:  1  PAF  Denies recurence  Continue Current meds    2  Chest discomfort  Atypical for cardiac  ? Reflux  Pseudowheeze on exam  Would recomm trial of pepcid  ALbuterol for wheezing   Wt loss    3  HTN  BP is OK today  No changes    4  HL  Pt stopped chol medcine  Would follow  Watch diet    5  Tob  Counselled on stopping     Current medicines are reviewed at length with the patient today.  The patient does not have concerns regarding medicines.  Signed, Dorris Carnes, MD  07/21/2016 10:20 AM    [facility] [place], [place], [place]  [address] Phone: [phone]; Fax: [phone]

## 2016-07-21 ENCOUNTER — Ambulatory Visit (INDEPENDENT_AMBULATORY_CARE_PROVIDER_SITE_OTHER): Payer: Federal, State, Local not specified - PPO | Admitting: Internal Medicine

## 2016-07-21 ENCOUNTER — Encounter: Payer: Self-pay | Admitting: Internal Medicine

## 2016-07-21 ENCOUNTER — Encounter: Payer: Self-pay | Admitting: Nurse Practitioner

## 2016-07-21 VITALS — BP 128/84 | HR 68 | Ht 69.0 in | Wt 255.6 lb

## 2016-07-21 DIAGNOSIS — I480 Paroxysmal atrial fibrillation: Secondary | ICD-10-CM

## 2016-07-21 DIAGNOSIS — R0789 Other chest pain: Secondary | ICD-10-CM | POA: Diagnosis not present

## 2016-07-21 DIAGNOSIS — E782 Mixed hyperlipidemia: Secondary | ICD-10-CM | POA: Diagnosis not present

## 2016-07-21 DIAGNOSIS — I10 Essential (primary) hypertension: Secondary | ICD-10-CM | POA: Diagnosis not present

## 2016-07-21 MED ORDER — ALBUTEROL SULFATE HFA 108 (90 BASE) MCG/ACT IN AERS: 2.0000 | INHALATION_SPRAY | Freq: Four times a day (QID) | RESPIRATORY_TRACT | 2 refills | Status: AC | PRN

## 2016-07-21 MED ORDER — OMEPRAZOLE 20 MG PO CPDR
20.0000 mg | DELAYED_RELEASE_CAPSULE | Freq: Every day | ORAL | 3 refills | Status: DC
Start: 2016-07-21 — End: 2017-06-04

## 2016-07-21 NOTE — Progress Notes (Signed)
Cardiology Office Note   Date:  07/21/2016   ID:  Scott Walter, DOB 1970-08-18, MRN [id number]  PCP:  Rosine Abe, DO  Cardiologist:   Dorris Carnes, MD       History of Present Illness: Scott Walter is a 46 y.o. male with a history of HTN, PAF,        No outpatient prescriptions have been marked as taking for the 07/21/16 encounter (Letter (Out)) with Fay Records, MD.     Allergies:   Ambien [zolpidem tartrate] and Depakote [divalproex sodium]   Past Medical History:  Diagnosis Date  . Anxiety   . Bipolar disorder ([place])    h/o SI  . Chronic insomnia   . Chronic lower back pain   . Depression   . Dyslipidemia   . Essential hypertension   . ETOH abuse   . XFGHWEXH(371.6)    "monthly" (01/26/2013)  . History of stomach ulcers ~ 2009  . Midsternal chest pain    a. 06/2014 Myoview: EF 50%, no ischemia/infarct.  . Migraine    "maybe 2 in my lifetime" (01/26/2013)  . PAF (paroxysmal atrial fibrillation) ([place])    a. Dx 05/2012;  b. 06/2014 Xarelto started in setting of ? TIA (CHA2DS2VASc = 3);  c. 06/2014 Echo: EF 60-65%, no rwma.  [name] PTSD (post-traumatic stress disorder)     Past Surgical History:  Procedure Laterality Date  . KNEE ARTHROSCOPY Right 09/2011     Social History:  The patient  reports that he has been smoking Cigarettes.  He has a 22.50 pack-year smoking history. He has quit using smokeless tobacco. His smokeless tobacco use included Chew. He reports that he drinks alcohol. He reports that he does not use drugs.   Family History:  The patient's family history includes CAD in his mother; Diabetes in his mother; Emphysema in his father; HIV in his brother; Healthy in his brother, brother, brother, brother, sister, sister, sister, sister, sister, sister, sister, and sister.    ROS:  Please see the history of present illness. All other systems are reviewed and  Negative to the above problem except as noted.    PHYSICAL EXAM: VS:  There were no vitals  taken for this visit.  GEN: Well nourished, well developed, in no acute distress  HEENT: normal  Neck: no JVD, carotid bruits, or masses Cardiac: RRR; no murmurs, rubs, or gallops,no edema  Respiratory:  clear to auscultation bilaterally, normal work of breathing GI: soft, nontender, nondistended, + BS  No hepatomegaly  MS: no deformity Moving all extremities   Skin: warm and dry, no rash Neuro:  Strength and sensation are intact Psych: euthymic mood, full affect   EKG:  EKG is ordered today.   Lipid Panel    Component Value Date/Time   CHOL 178 07/18/2014 2340   TRIG 202 (H) 07/18/2014 2340   HDL 40 (L) 07/18/2014 2340   CHOLHDL 4.5 07/18/2014 2340   VLDL 40 07/18/2014 2340   LDLCALC 98 07/18/2014 2340      Wt Readings from Last 3 Encounters:  07/21/16 115.9 kg (255 lb 9.6 oz)  01/07/16 117.8 kg (259 lb 9.6 oz)  12/03/15 113.3 kg (249 lb 12.8 oz)      ASSESSMENT AND PLAN:     Current medicines are reviewed at length with the patient today.  The patient does not have concerns regarding medicines.  Signed, Dorris Carnes, MD  07/21/2016 10:39 AM    [place] [phone]  [address], [place], [place]  [address] Phone: [phone]; Fax: [phone]

## 2016-07-21 NOTE — Patient Instructions (Addendum)
Medication Instructions:  START Omeprazole 20 mg once daily - take in the morning 20 minutes before breakfast START Albuterol inhaler 2 puffs every 6 hours as needed for wheezing   Labwork: None Ordered   Testing/Procedures: None Ordered   Follow-Up: Your physician wants you to follow-up in: 7 months with Dr. [name] (January 2019).  You will receive a reminder letter in the mail two months in advance. If you don't receive a letter, please call our office to schedule the follow-up appointment.   If you need a refill on your cardiac medications before your next appointment, please call your pharmacy.   Thank you for choosing [facility]! Christen Bame, RN [phone]

## 2016-07-23 DIAGNOSIS — S81852A Open bite, left lower leg, initial encounter: Secondary | ICD-10-CM | POA: Diagnosis not present

## 2016-07-23 DIAGNOSIS — Z23 Encounter for immunization: Secondary | ICD-10-CM | POA: Diagnosis not present

## 2016-07-23 DIAGNOSIS — W540XXA Bitten by dog, initial encounter: Secondary | ICD-10-CM | POA: Diagnosis not present

## 2016-07-23 DIAGNOSIS — Y9389 Activity, other specified: Secondary | ICD-10-CM | POA: Diagnosis not present

## 2016-07-23 DIAGNOSIS — Y990 Civilian activity done for income or pay: Secondary | ICD-10-CM | POA: Diagnosis not present

## 2016-07-26 DIAGNOSIS — Z23 Encounter for immunization: Secondary | ICD-10-CM | POA: Diagnosis not present

## 2016-07-28 ENCOUNTER — Encounter: Payer: Self-pay | Admitting: Family Medicine

## 2016-07-28 ENCOUNTER — Ambulatory Visit (INDEPENDENT_AMBULATORY_CARE_PROVIDER_SITE_OTHER): Payer: Federal, State, Local not specified - PPO | Admitting: Family Medicine

## 2016-07-28 VITALS — BP 118/82 | HR 64 | Temp 98.2°F | Ht 69.0 in | Wt 249.7 lb

## 2016-07-28 DIAGNOSIS — Z0000 Encounter for general adult medical examination without abnormal findings: Secondary | ICD-10-CM

## 2016-07-28 LAB — CBC WITH DIFFERENTIAL/PLATELET
Basophils Absolute: 0 10*3/uL (ref 0.0–0.1)
Basophils Relative: 0.3 % (ref 0.0–3.0)
EOS PCT: 1.3 % (ref 0.0–5.0)
Eosinophils Absolute: 0.1 10*3/uL (ref 0.0–0.7)
HCT: 45.3 % (ref 39.0–52.0)
HEMOGLOBIN: 15.6 g/dL (ref 13.0–17.0)
Lymphocytes Relative: 27.9 % (ref 12.0–46.0)
Lymphs Abs: 1.9 10*3/uL (ref 0.7–4.0)
MCHC: 34.5 g/dL (ref 30.0–36.0)
MCV: 90.7 fl (ref 78.0–100.0)
MONOS PCT: 6.2 % (ref 3.0–12.0)
Monocytes Absolute: 0.4 10*3/uL (ref 0.1–1.0)
Neutro Abs: 4.4 10*3/uL (ref 1.4–7.7)
Neutrophils Relative %: 64.3 % (ref 43.0–77.0)
Platelets: 192 10*3/uL (ref 150.0–400.0)
RBC: 5 Mil/uL (ref 4.22–5.81)
RDW: 14.5 % (ref 11.5–15.5)
WBC: 6.9 10*3/uL (ref 4.0–10.5)

## 2016-07-28 LAB — LIPID PANEL
Cholesterol: 184 mg/dL (ref 0–200)
HDL: 41.1 mg/dL (ref >39.00)
LDL Cholesterol: 105 mg/dL — ABNORMAL HIGH (ref 0–99)
NONHDL: 142.57
Total CHOL/HDL Ratio: 4
Triglycerides: 187 mg/dL — ABNORMAL HIGH (ref 0.0–149.0)
VLDL: 37.4 mg/dL (ref 0.0–40.0)

## 2016-07-28 LAB — HEPATIC FUNCTION PANEL
ALBUMIN: 4.4 g/dL (ref 3.5–5.2)
ALK PHOS: 79 U/L (ref 39–117)
ALT: 30 U/L (ref 0–53)
AST: 16 U/L (ref 0–37)
Bilirubin, Direct: 0.1 mg/dL (ref 0.0–0.3)
TOTAL PROTEIN: 7.3 g/dL (ref 6.0–8.3)
Total Bilirubin: 0.7 mg/dL (ref 0.2–1.2)

## 2016-07-28 LAB — BASIC METABOLIC PANEL
BUN: 11 mg/dL (ref 6–23)
CALCIUM: 9.5 mg/dL (ref 8.4–10.5)
CO2: 24 mEq/L (ref 19–32)
Chloride: 105 mEq/L (ref 96–112)
Creatinine, Ser: 0.75 mg/dL (ref 0.40–1.50)
GFR: 119.41 mL/min (ref >60.00)
GLUCOSE: 85 mg/dL (ref 70–99)
Potassium: 4.1 mEq/L (ref 3.5–5.1)
Sodium: 139 mEq/L (ref 135–145)

## 2016-07-28 LAB — PSA: PSA: 3.22 ng/mL (ref 0.10–4.00)

## 2016-07-28 LAB — TSH: TSH: 0.42 u[IU]/mL (ref 0.35–4.50)

## 2016-07-28 MED ORDER — TRIAMCINOLONE ACETONIDE 0.1 % EX CREA: 1.0000 "application " | TOPICAL_CREAM | Freq: Two times a day (BID) | CUTANEOUS | 2 refills | Status: DC | PRN

## 2016-07-28 NOTE — Progress Notes (Signed)
Subjective:     Patient ID: Scott Walter, male   DOB: 10-16-1970, 46 y.o.   MRN: [id number]  HPI Patient seen for physical exam. He has history of atrial fibrillation followed by cardiology. He also has history of bipolar disorder is not currently seeing psychiatrist. He states he has history of post traumatic stress disorder. He's had counseling in the past. Current medications reviewed. Tetanus up-to-date.  Current smoker -one half to one pack cigarettes per day. Strong family history of cancer with prostate cancer in grandfather and ovarian cancer mother and couple of other family members.  Patient works with [employer]. Very stressful job. Frequently works 60-80 hours per week. No consistent exercise.  Past Medical History:  Diagnosis Date  . Anxiety   . Bipolar disorder ([place])    h/o SI  . Chronic insomnia   . Chronic lower back pain   . Depression   . Dyslipidemia   . Essential hypertension   . ETOH abuse   . LPFXTKWI(097.3)    "monthly" (01/26/2013)  . History of stomach ulcers ~ 2009  . Midsternal chest pain    a. 06/2014 Myoview: EF 50%, no ischemia/infarct.  . Migraine    "maybe 2 in my lifetime" (01/26/2013)  . PAF (paroxysmal atrial fibrillation) ([place])    a. Dx 05/2012;  b. 06/2014 Xarelto started in setting of ? TIA (CHA2DS2VASc = 3);  c. 06/2014 Echo: EF 60-65%, no rwma.  [name] PTSD (post-traumatic stress disorder)    Past Surgical History:  Procedure Laterality Date  . KNEE ARTHROSCOPY Right 09/2011    reports that he has been smoking Cigarettes.  He has a 22.50 pack-year smoking history. He has quit using smokeless tobacco. His smokeless tobacco use included Chew. He reports that he drinks alcohol. He reports that he does not use drugs. family history includes CAD in his mother; Diabetes in his mother; Emphysema in his father; HIV in his brother; Healthy in his brother, brother, brother, brother, sister, sister, sister, sister, sister, sister, sister, and sister. Allergies   Allergen Reactions  . Ambien [Zolpidem Tartrate] Other (See Comments)    "Nightmares and mood swings  . Depakote [Divalproex Sodium] Anaphylaxis and Shortness Of Breath     Review of Systems  Constitutional: Negative for appetite change and unexpected weight change.  Eyes: Negative for visual disturbance.  Endocrine: Negative for polydipsia and polyuria.       Objective:   Physical Exam  Constitutional: He appears well-developed and well-nourished. No distress.  Neck: Neck supple. No thyromegaly present.  Cardiovascular: Normal rate and regular rhythm.   Pulmonary/Chest: Effort normal and breath sounds normal. No respiratory distress. He has no wheezes. He has no rales.  Abdominal: Soft. There is no tenderness.  Musculoskeletal: He exhibits no edema.  Lymphadenopathy:    He has no cervical adenopathy.  Skin: Rash noted.       Assessment:     Physical exam. Patient is history of intermittent atrial fibrillation and remains on anticoagulant. He appears to be in sinus rhythm today. Nicotine use as above    Plan:     -Obtain screening lab work -The natural history of prostate cancer and ongoing controversy regarding screening and potential treatment outcomes of prostate cancer has been discussed with the patient. The meaning of a false positive PSA and a false negative PSA has been discussed. He indicates understanding of the limitations of this screening test and wishes to proceed with screening PSA testing. -Discussed smoking cessation but current time not good  for him because of increased stress levels -We discussed trying to get more consistent regular exercise -[facility] -discussed non-pharmacologic ways of handling stress.    Eulas Post MD [facility] at [facility]

## 2016-07-29 ENCOUNTER — Other Ambulatory Visit: Payer: Self-pay | Admitting: Family Medicine

## 2016-07-29 DIAGNOSIS — R972 Elevated prostate specific antigen [PSA]: Secondary | ICD-10-CM

## 2016-08-30 DIAGNOSIS — S62631A Displaced fracture of distal phalanx of left index finger, initial encounter for closed fracture: Secondary | ICD-10-CM | POA: Diagnosis not present

## 2016-08-30 DIAGNOSIS — S86812A Strain of other muscle(s) and tendon(s) at lower leg level, left leg, initial encounter: Secondary | ICD-10-CM | POA: Diagnosis not present

## 2016-08-30 DIAGNOSIS — S4992XA Unspecified injury of left shoulder and upper arm, initial encounter: Secondary | ICD-10-CM | POA: Diagnosis not present

## 2016-08-30 DIAGNOSIS — Z888 Allergy status to other drugs, medicaments and biological substances status: Secondary | ICD-10-CM | POA: Diagnosis not present

## 2016-08-30 DIAGNOSIS — S40812A Abrasion of left upper arm, initial encounter: Secondary | ICD-10-CM | POA: Diagnosis not present

## 2016-08-30 DIAGNOSIS — S60222A Contusion of left hand, initial encounter: Secondary | ICD-10-CM | POA: Diagnosis not present

## 2016-08-30 DIAGNOSIS — S62638A Displaced fracture of distal phalanx of other finger, initial encounter for closed fracture: Secondary | ICD-10-CM | POA: Diagnosis not present

## 2016-08-30 DIAGNOSIS — S46812A Strain of other muscles, fascia and tendons at shoulder and upper arm level, left arm, initial encounter: Secondary | ICD-10-CM | POA: Diagnosis not present

## 2016-08-30 DIAGNOSIS — Y9389 Activity, other specified: Secondary | ICD-10-CM | POA: Diagnosis not present

## 2016-08-30 DIAGNOSIS — S46912A Strain of unspecified muscle, fascia and tendon at shoulder and upper arm level, left arm, initial encounter: Secondary | ICD-10-CM | POA: Diagnosis not present

## 2016-08-30 DIAGNOSIS — S0081XA Abrasion of other part of head, initial encounter: Secondary | ICD-10-CM | POA: Diagnosis not present

## 2016-08-30 DIAGNOSIS — S86912A Strain of unspecified muscle(s) and tendon(s) at lower leg level, left leg, initial encounter: Secondary | ICD-10-CM | POA: Diagnosis not present

## 2016-08-30 DIAGNOSIS — S6992XA Unspecified injury of left wrist, hand and finger(s), initial encounter: Secondary | ICD-10-CM | POA: Diagnosis not present

## 2016-08-30 DIAGNOSIS — S40811A Abrasion of right upper arm, initial encounter: Secondary | ICD-10-CM | POA: Diagnosis not present

## 2016-08-30 DIAGNOSIS — T148XXA Other injury of unspecified body region, initial encounter: Secondary | ICD-10-CM | POA: Diagnosis not present

## 2016-09-02 ENCOUNTER — Encounter: Payer: Self-pay | Admitting: Family Medicine

## 2016-09-02 ENCOUNTER — Ambulatory Visit (INDEPENDENT_AMBULATORY_CARE_PROVIDER_SITE_OTHER): Payer: Federal, State, Local not specified - PPO | Admitting: Family Medicine

## 2016-09-02 VITALS — BP 142/102 | HR 76 | Temp 98.4°F

## 2016-09-02 DIAGNOSIS — F0781 Postconcussional syndrome: Secondary | ICD-10-CM

## 2016-09-02 DIAGNOSIS — M25561 Pain in right knee: Secondary | ICD-10-CM

## 2016-09-02 DIAGNOSIS — M25572 Pain in left ankle and joints of left foot: Secondary | ICD-10-CM | POA: Diagnosis not present

## 2016-09-02 DIAGNOSIS — M25512 Pain in left shoulder: Secondary | ICD-10-CM

## 2016-09-02 DIAGNOSIS — M549 Dorsalgia, unspecified: Secondary | ICD-10-CM | POA: Diagnosis not present

## 2016-09-02 DIAGNOSIS — T07XXXA Unspecified multiple injuries, initial encounter: Secondary | ICD-10-CM | POA: Diagnosis not present

## 2016-09-02 DIAGNOSIS — M79642 Pain in left hand: Secondary | ICD-10-CM

## 2016-09-02 MED ORDER — OXYCODONE-ACETAMINOPHEN 5-325 MG PO TABS: 1.0000 | ORAL_TABLET | Freq: Three times a day (TID) | ORAL | 0 refills | Status: DC | PRN
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
## 2016-09-02 NOTE — Patient Instructions (Addendum)
-Keep abrasions clean with soap and water and follow-up promptly for signs of secondary infection such as increased redness, swelling, or drainage -We will set up orthopedic referral -Continue left upper extremity sling for support as needed and may take off to bathe.  Concussion, Adult A concussion is a brain injury from a direct hit (blow) to the head or body. This blow causes the brain to shake quickly back and forth inside the skull. This can damage brain cells and cause chemical changes in the brain. A concussion may also be known as a mild traumatic brain injury (TBI). Concussions are usually not life-threatening, but the effects of a concussion can be serious. If you have a concussion, you are more likely to experience concussion-like symptoms after a direct blow to the head in the future. What are the causes? This condition is caused by:  A direct blow to the head, such as from running into another player during a game, being hit in a fight, or hitting your head on a hard surface.  A jolt of the head or neck that causes the brain to move back and forth inside the skull, such as in a car crash.  What are the signs or symptoms? The signs of a concussion can be hard to notice. Early on, they may be missed by you, family members, and health care providers. You may look fine but act or feel differently. Symptoms are usually temporary, but they may last for days, weeks, or even longer. Some symptoms may appear right away but other symptoms may not show up for hours or days. Every head injury is different. Symptoms may include:  Headaches. This can include a feeling of pressure in the head.  Memory problems.  Trouble concentrating, organizing, or making decisions.  Slowness in thinking, acting or reacting, speaking, or reading.  Confusion.  Fatigue.  Changes in eating or sleeping patterns.  Problems with coordination or balance.  Nausea or vomiting.  Numbness or  tingling.  Sensitivity to light or noise.  Vision or hearing problems.  Reduced sense of smell.  Irritability or mood changes.  Dizziness.  Lack of motivation.  Seeing or hearing things that other people do not see or hear (hallucinations).  How is this diagnosed? This condition is diagnosed based on:  Your symptoms.  A description of your injury.  You may also have tests, including:  Imaging tests, such as a CT scan or MRI. These are done to look for signs of brain injury.  Neuropsychological tests. These measure your thinking, understanding, learning, and remembering abilities.  How is this treated? This condition is treated with physical and mental rest and careful observation, usually at home. If the concussion is severe, you may need to stay home from work for a while. You may be referred to a concussion clinic or to other health care providers for management. It is important that you tell your health care provider if:  You are taking any medicines, including prescription medicines, over-the-counter medicines, and natural remedies. Some medicines, such as blood thinners (anticoagulants) and aspirin, may increase the chance of complications, such as bleeding.  You are taking or have taken alcohol or illegal drugs. Alcohol and certain other drugs may slow your recovery and can put you at risk of further injury.  How fast you will recover from a concussion depends on many factors, such as how severe your concussion is, what part of your brain was injured, how old you are, and how healthy you were  before the concussion. Recovery can take time. It is important to wait to return to activity until a health care provider says it is safe to do that and your symptoms are completely gone. Follow these instructions at home: Activity  Limit activities that require a lot of thought or concentration. These may include: ? Doing homework or job-related work. ? Watching TV. ? Working on  the computer. ? Playing memory games and puzzles.  Rest. Rest helps the brain to heal. Make sure you: ? Get plenty of sleep at night. Avoid staying up late at night. ? Keep the same bedtime hours on weekends and weekdays. ? Rest during the day. Take naps or rest breaks when you feel tired.  Having another concussion before the first one has healed can be dangerous. Do not do high-risk activities that could cause a second concussion, such as riding a bicycle or playing sports.  Ask your health care provider when you can return to your normal activities, such as school, work, athletics, driving, riding a bicycle, or using heavy machinery. Your ability to react may be slower after a brain injury. Never do these activities if you are dizzy. Your health care provider will likely give you a plan for gradually returning to activities. General instructions  Take over-the-counter and prescription medicines only as told by your health care provider.  Do not drink alcohol until your health care provider says you can.  If it is harder than usual to remember things, write them down.  If you are easily distracted, try to do one thing at a time. For example, do not try to watch TV while fixing dinner.  Talk with family members or close friends when making important decisions.  Watch your symptoms and tell others to do the same. Complications sometimes occur after a concussion. Older adults with a brain injury may have a higher risk of serious complications, such as a blood clot in the brain.  Tell your teachers, school nurse, school counselor, coach, athletic trainer, or work Freight forwarder about your injury, symptoms, and restrictions. Tell them about what you can or cannot do. They should watch for: ? Increased problems with attention or concentration. ? Increased difficulty remembering or learning new information. ? Increased time needed to complete tasks or assignments. ? Increased irritability or  decreased ability to cope with stress. ? Increased symptoms.  Keep all follow-up visits as told by your health care provider. This is important. How is this prevented? It is very important to avoid another brain injury, especially as you recover. In rare cases, another injury can lead to permanent brain damage, brain swelling, or death. The risk of this is greatest during the first 7-10 days after a head injury. Avoid injuries by:  Wearing a seat belt when riding in a car.  Wearing a helmet when biking, skiing, skateboarding, skating, or doing similar activities.  Avoiding activities that could lead to a second concussion, such as contact or recreational sports, until your health care provider says it is okay.  Taking safety measures in your home, such as: ? Removing clutter and tripping hazards from floors and stairways. ? Using grab bars in bathrooms and handrails by stairs. ? Placing non-slip mats on floors and in bathtubs. ? Improving lighting in dim areas.  Contact a health care provider if:  Your symptoms get worse.  You have new symptoms.  You continue to have symptoms for more than 2 weeks. Get help right away if:  You have severe or  worsening headaches.  You have weakness or numbness in any part of your body.  Your coordination gets worse.  You vomit repeatedly.  You are sleepier.  The pupil of one eye is larger than the other.  You have convulsions or a seizure.  Your speech is slurred.  Your fatigue, confusion, or irritability gets worse.  You cannot recognize people or places.  You have neck pain.  It is difficult to wake you up.  You have unusual behavior changes.  You lose consciousness. Summary  A concussion is a brain injury from a direct hit (blow) to the head or body.  A concussion may also be called a mild traumatic brain injury (TBI).  You may have imaging tests and neuropsychological tests to diagnose a concussion.  This condition is  treated with physical and mental rest and careful observation.  Ask your health care provider when you can return to your normal activities, such as school, work, athletics, driving, riding a bicycle, or using heavy machinery. Follow safety instructions as told by your health care provider. This information is not intended to replace advice given to you by your health care provider. Make sure you discuss any questions you have with your health care provider. Document Released: 04/26/2003 Document Revised: 01/15/2016 Document Reviewed: 01/15/2016 Elsevier Interactive Patient Education  2017 [employer].

## 2016-09-04 ENCOUNTER — Encounter: Payer: Self-pay | Admitting: Family Medicine

## 2016-09-05 ENCOUNTER — Encounter: Payer: Self-pay | Admitting: Family Medicine

## 2016-09-08 ENCOUNTER — Telehealth: Payer: Self-pay | Admitting: Family Medicine

## 2016-09-08 DIAGNOSIS — M25562 Pain in left knee: Secondary | ICD-10-CM | POA: Diagnosis not present

## 2016-09-08 DIAGNOSIS — M25512 Pain in left shoulder: Secondary | ICD-10-CM

## 2016-09-08 DIAGNOSIS — M542 Cervicalgia: Secondary | ICD-10-CM | POA: Diagnosis not present

## 2016-09-08 DIAGNOSIS — M79642 Pain in left hand: Secondary | ICD-10-CM

## 2016-09-08 DIAGNOSIS — M25472 Effusion, left ankle: Secondary | ICD-10-CM

## 2016-09-08 DIAGNOSIS — M25561 Pain in right knee: Secondary | ICD-10-CM

## 2016-09-08 NOTE — Telephone Encounter (Signed)
Patient is needing a refill for Oxycodone.

## 2016-09-08 NOTE — Telephone Encounter (Signed)
Patient's spouse picked up forms 09/08/2016.  Verified I.D. Spouse asked to be billed for forms.  Aware of $20 fee.

## 2016-09-09 NOTE — Telephone Encounter (Signed)
Last office visit and refill 09/02/16.  Okay to fill?

## 2016-09-09 NOTE — Telephone Encounter (Signed)
yes

## 2016-09-09 NOTE — Telephone Encounter (Signed)
Has he seen ortho yet?  I would prefer he not stay on the Oxycodone to avoid dependency.  Try OTC medications to see if he can get adequate relief.

## 2016-09-09 NOTE — Telephone Encounter (Signed)
Patient will try OTC aleve and will call back as needed.

## 2016-09-09 NOTE — Telephone Encounter (Signed)
Spoke with patient and he had an x-ray yesterday.  He has a mri scheduled for tomorrow.  Ortho told him the only time they will give pain meds is when they do surgery.  Would you like for him to try OTC?

## 2016-09-13 DIAGNOSIS — M25562 Pain in left knee: Secondary | ICD-10-CM | POA: Diagnosis not present

## 2016-09-13 DIAGNOSIS — M25512 Pain in left shoulder: Secondary | ICD-10-CM | POA: Diagnosis not present

## 2016-09-17 DIAGNOSIS — M2242 Chondromalacia patellae, left knee: Secondary | ICD-10-CM | POA: Diagnosis not present

## 2016-09-17 DIAGNOSIS — S43102A Unspecified dislocation of left acromioclavicular joint, initial encounter: Secondary | ICD-10-CM | POA: Diagnosis not present

## 2016-09-17 DIAGNOSIS — S43432A Superior glenoid labrum lesion of left shoulder, initial encounter: Secondary | ICD-10-CM | POA: Diagnosis not present

## 2016-10-02 DIAGNOSIS — M2242 Chondromalacia patellae, left knee: Secondary | ICD-10-CM | POA: Diagnosis not present

## 2016-10-07 ENCOUNTER — Emergency Department (HOSPITAL_COMMUNITY): Payer: Federal, State, Local not specified - PPO

## 2016-10-07 ENCOUNTER — Encounter (HOSPITAL_COMMUNITY): Payer: Self-pay

## 2016-10-07 ENCOUNTER — Ambulatory Visit: Payer: Federal, State, Local not specified - PPO | Admitting: Physician Assistant

## 2016-10-07 ENCOUNTER — Emergency Department (HOSPITAL_COMMUNITY)
Admission: EM | Admit: 2016-10-07 | Discharge: 2016-10-07 | Discharge status: Against Medical Advice | Payer: Federal, State, Local not specified - PPO | Attending: Emergency Medicine | Admitting: Emergency Medicine

## 2016-10-07 DIAGNOSIS — R079 Chest pain, unspecified: Secondary | ICD-10-CM | POA: Diagnosis not present

## 2016-10-07 DIAGNOSIS — Z5321 Procedure and treatment not carried out due to patient leaving prior to being seen by health care provider: Secondary | ICD-10-CM | POA: Insufficient documentation

## 2016-10-07 LAB — BASIC METABOLIC PANEL
Anion gap: 8 (ref 5–15)
BUN: 10 mg/dL (ref 6–20)
CO2: 23 mmol/L (ref 22–32)
Calcium: 9.1 mg/dL (ref 8.9–10.3)
Chloride: 105 mmol/L (ref 101–111)
Creatinine, Ser: 0.79 mg/dL (ref 0.61–1.24)
GFR calc Af Amer: >60 mL/min (ref >60)
GFR calc non Af Amer: >60 mL/min (ref >60)
Glucose, Bld: 100 mg/dL — ABNORMAL HIGH (ref 65–99)
Potassium: 3.9 mmol/L (ref 3.5–5.1)
Sodium: 136 mmol/L (ref 135–145)

## 2016-10-07 LAB — CBC
HCT: 42.6 % (ref 39.0–52.0)
Hemoglobin: 14.9 g/dL (ref 13.0–17.0)
MCH: 30.7 pg (ref 26.0–34.0)
MCHC: 35 g/dL (ref 30.0–36.0)
MCV: 87.7 fL (ref 78.0–100.0)
Platelets: 215 10*3/uL (ref 150–400)
RBC: 4.86 MIL/uL (ref 4.22–5.81)
RDW: 13.9 % (ref 11.5–15.5)
WBC: 6.5 10*3/uL (ref 4.0–10.5)

## 2016-10-07 LAB — I-STAT TROPONIN, ED: Troponin i, poc: 0 ng/mL (ref 0.00–0.08)

## 2016-10-07 NOTE — ED Triage Notes (Signed)
Onset 3pm left chest pain radiating through to back, shortness of breath.  Pt was at physical therapy, BP elevated, pt was sent to ED.  Pt talking in complete sentences, NAD at triage.

## 2016-10-07 NOTE — ED Notes (Signed)
Pt came to front desk and informed registration that he was leaving. Pt left post triage

## 2016-10-09 ENCOUNTER — Encounter: Payer: Self-pay | Admitting: Internal Medicine

## 2016-10-14 DIAGNOSIS — S43102D Unspecified dislocation of left acromioclavicular joint, subsequent encounter: Secondary | ICD-10-CM | POA: Diagnosis not present

## 2016-10-14 DIAGNOSIS — M2242 Chondromalacia patellae, left knee: Secondary | ICD-10-CM | POA: Diagnosis not present

## 2016-10-15 DIAGNOSIS — S43432D Superior glenoid labrum lesion of left shoulder, subsequent encounter: Secondary | ICD-10-CM | POA: Diagnosis not present

## 2016-10-15 DIAGNOSIS — M2242 Chondromalacia patellae, left knee: Secondary | ICD-10-CM | POA: Diagnosis not present

## 2016-10-15 DIAGNOSIS — S43102D Unspecified dislocation of left acromioclavicular joint, subsequent encounter: Secondary | ICD-10-CM | POA: Diagnosis not present

## 2016-10-17 ENCOUNTER — Encounter: Payer: Self-pay | Admitting: *Deleted

## 2016-10-21 DIAGNOSIS — S43102D Unspecified dislocation of left acromioclavicular joint, subsequent encounter: Secondary | ICD-10-CM | POA: Diagnosis not present

## 2016-10-21 DIAGNOSIS — M2242 Chondromalacia patellae, left knee: Secondary | ICD-10-CM | POA: Diagnosis not present

## 2016-10-23 DIAGNOSIS — M2242 Chondromalacia patellae, left knee: Secondary | ICD-10-CM | POA: Diagnosis not present

## 2016-11-06 ENCOUNTER — Encounter: Payer: Self-pay | Admitting: Family Medicine

## 2016-11-10 ENCOUNTER — Ambulatory Visit: Payer: Federal, State, Local not specified - PPO | Admitting: Internal Medicine

## 2016-12-23 ENCOUNTER — Other Ambulatory Visit: Payer: Self-pay | Admitting: Internal Medicine

## 2016-12-23 NOTE — Telephone Encounter (Signed)
Age 46 years Wt 113.3kg  07/28/2016 Saw Dr [name] on 07/21/2016 10/07/2016 Hgb 14.9 HCT 42.6 10/07/2016  SrCr 0.79 CrCl 189.23 Refill done for Xarelto 20 mg daily as requested

## 2017-01-28 ENCOUNTER — Other Ambulatory Visit (INDEPENDENT_AMBULATORY_CARE_PROVIDER_SITE_OTHER): Payer: Federal, State, Local not specified - PPO

## 2017-01-28 DIAGNOSIS — R972 Elevated prostate specific antigen [PSA]: Secondary | ICD-10-CM

## 2017-01-29 LAB — PSA: PSA: 0.85 ng/mL (ref 0.10–4.00)

## 2017-02-17 HISTORY — PX: KNEE SURGERY: SHX244

## 2017-02-19 DIAGNOSIS — K080 Exfoliation of teeth due to systemic causes: Secondary | ICD-10-CM | POA: Diagnosis not present

## 2017-03-06 NOTE — Progress Notes (Signed)
Erroneous encounter

## 2017-03-13 DIAGNOSIS — K080 Exfoliation of teeth due to systemic causes: Secondary | ICD-10-CM | POA: Diagnosis not present

## 2017-03-19 ENCOUNTER — Telehealth: Payer: Self-pay

## 2017-03-19 DIAGNOSIS — F1010 Alcohol abuse, uncomplicated: Secondary | ICD-10-CM | POA: Diagnosis not present

## 2017-03-19 NOTE — Telephone Encounter (Signed)
Pt's wife states that she went to pick up her husbands Bisoprolol and its on back order and they want to know if Dr.Ross could switch him over to something different because he only has one pill left.

## 2017-03-19 NOTE — Telephone Encounter (Signed)
I am not aware of issues with back order for this medication.  Routing to [facility] for input re: this and to Dr. [name] to inform.

## 2017-03-20 NOTE — Telephone Encounter (Signed)
Called in Rx for atenolol 25 mg   To [facility] [address].

## 2017-03-20 NOTE — Telephone Encounter (Signed)
Bisoprolol is on nationwide backorder. Unfortunately it is not expected to be available until late this spring. Initially we thought this was just short term backorder, but it appears that it may be a long standing issue.

## 2017-03-23 MED ORDER — ATENOLOL 25 MG PO TABS: 25.0000 mg | ORAL_TABLET | Freq: Every day | ORAL | 3 refills | Status: DC

## 2017-03-23 NOTE — Telephone Encounter (Signed)
Called and left detailed message of medication change and that it has been called into [facility].  Advised to call back if any questions or concerns.

## 2017-04-01 DIAGNOSIS — S43432A Superior glenoid labrum lesion of left shoulder, initial encounter: Secondary | ICD-10-CM | POA: Diagnosis not present

## 2017-04-07 ENCOUNTER — Encounter: Payer: Self-pay | Admitting: Family Medicine

## 2017-04-07 ENCOUNTER — Ambulatory Visit (INDEPENDENT_AMBULATORY_CARE_PROVIDER_SITE_OTHER): Payer: Federal, State, Local not specified - PPO | Admitting: Family Medicine

## 2017-04-07 VITALS — BP 140/90 | HR 69 | Temp 98.1°F | Wt 262.4 lb

## 2017-04-07 DIAGNOSIS — H109 Unspecified conjunctivitis: Secondary | ICD-10-CM | POA: Diagnosis not present

## 2017-04-07 DIAGNOSIS — J069 Acute upper respiratory infection, unspecified: Secondary | ICD-10-CM | POA: Diagnosis not present

## 2017-04-07 DIAGNOSIS — B9789 Other viral agents as the cause of diseases classified elsewhere: Secondary | ICD-10-CM

## 2017-04-07 MED ORDER — POLYMYXIN B-TRIMETHOPRIM 10000-0.1 UNIT/ML-% OP SOLN: 2.0000 [drp] | OPHTHALMIC | 0 refills | Status: DC

## 2017-04-07 NOTE — Patient Instructions (Signed)

## 2017-04-07 NOTE — Progress Notes (Signed)
Subjective:     Patient ID: Scott Walter, male   DOB: 01/02/1971, 48 y.o.   MRN: [id number]  HPI Patient seen with one-week history of URI type symptoms. Had some sore throat, nasal drainage which is clear and cough. No fever. He is continue to work full time. He developed couple days ago some bilateral eye irritation. He's had some early morning crusting from both eyes. No visual changes. No eye pain. Slight itching.  Past Medical History:  Diagnosis Date  . Anxiety   . Bipolar disorder ([place])    h/o SI  . Chronic insomnia   . Chronic lower back pain   . Depression   . Dyslipidemia   . Essential hypertension   . ETOH abuse   . ZYSAYTKZ(601.0)    "monthly" (01/26/2013)  . History of stomach ulcers ~ 2009  . Midsternal chest pain    a. 06/2014 Myoview: EF 50%, no ischemia/infarct.  . Migraine    "maybe 2 in my lifetime" (01/26/2013)  . PAF (paroxysmal atrial fibrillation) ([place])    a. Dx 05/2012;  b. 06/2014 Xarelto started in setting of ? TIA (CHA2DS2VASc = 3);  c. 06/2014 Echo: EF 60-65%, no rwma.  [name] PTSD (post-traumatic stress disorder)    Past Surgical History:  Procedure Laterality Date  . KNEE ARTHROSCOPY Right 09/2011    reports that he quit smoking about 4 weeks ago. His smoking use included cigarettes. He has a 22.50 pack-year smoking history. He has quit using smokeless tobacco. His smokeless tobacco use included chew. He reports that he drinks alcohol. He reports that he does not use drugs. family history includes CAD in his mother; Diabetes in his mother; Emphysema in his father; HIV in his brother; Healthy in his brother, brother, brother, brother, sister, sister, sister, sister, sister, sister, sister, and sister. Allergies  Allergen Reactions  . Ambien [Zolpidem Tartrate] Other (See Comments)    "Nightmares and mood swings  . Depakote [Divalproex Sodium] Anaphylaxis and Shortness Of Breath     Review of Systems  Constitutional: Positive for fatigue. Negative for  chills and fever.  HENT: Positive for congestion and sore throat.   Eyes: Positive for discharge, redness and itching. Negative for visual disturbance.  Respiratory: Positive for cough.        Objective:   Physical Exam  Constitutional: He appears well-developed and well-nourished.  HENT:  Right Ear: External ear normal.  Left Ear: External ear normal.  Mouth/Throat: Oropharynx is clear and moist.  Eyes:  Conjunctiva mild erythema bilaterally. Pupils equal round reactive to light.  Neck: Neck supple.  Cardiovascular: Normal rate and regular rhythm.  Pulmonary/Chest: Effort normal and breath sounds normal. No respiratory distress. He has no wheezes. He has no rales.  Lymphadenopathy:    He has no cervical adenopathy.       Assessment:     #1 probable viral URI  #2 bilateral bacterial conjunctivitis    Plan:     -Warm compresses to eyes several times daily -Start Polytrim ophthalmic drops to both eyes every 2-4 hours while awake -Touch base if symptoms not improving in 3-4 days -Follow-up promptly for any fever or worsening symptoms otherwise treat URI symptoms symptomatically  Eulas Post MD [facility] at [facility]

## 2017-04-13 DIAGNOSIS — F1010 Alcohol abuse, uncomplicated: Secondary | ICD-10-CM | POA: Diagnosis not present

## 2017-04-15 DIAGNOSIS — M25512 Pain in left shoulder: Secondary | ICD-10-CM | POA: Diagnosis not present

## 2017-04-29 ENCOUNTER — Encounter (HOSPITAL_BASED_OUTPATIENT_CLINIC_OR_DEPARTMENT_OTHER): Payer: Self-pay | Admitting: *Deleted

## 2017-04-29 ENCOUNTER — Emergency Department (HOSPITAL_BASED_OUTPATIENT_CLINIC_OR_DEPARTMENT_OTHER)
Admission: EM | Admit: 2017-04-29 | Discharge: 2017-04-29 | Disposition: A | Discharge status: Home / Self Care | Payer: Federal, State, Local not specified - PPO | Attending: Emergency Medicine | Admitting: Emergency Medicine

## 2017-04-29 ENCOUNTER — Other Ambulatory Visit: Payer: Self-pay

## 2017-04-29 DIAGNOSIS — H9203 Otalgia, bilateral: Secondary | ICD-10-CM | POA: Insufficient documentation

## 2017-04-29 DIAGNOSIS — R42 Dizziness and giddiness: Secondary | ICD-10-CM | POA: Diagnosis not present

## 2017-04-29 DIAGNOSIS — Z87891 Personal history of nicotine dependence: Secondary | ICD-10-CM | POA: Diagnosis not present

## 2017-04-29 DIAGNOSIS — H9313 Tinnitus, bilateral: Secondary | ICD-10-CM | POA: Diagnosis not present

## 2017-04-29 DIAGNOSIS — I10 Essential (primary) hypertension: Secondary | ICD-10-CM | POA: Insufficient documentation

## 2017-04-29 DIAGNOSIS — Z8673 Personal history of transient ischemic attack (TIA), and cerebral infarction without residual deficits: Secondary | ICD-10-CM | POA: Insufficient documentation

## 2017-04-29 DIAGNOSIS — Z7901 Long term (current) use of anticoagulants: Secondary | ICD-10-CM | POA: Insufficient documentation

## 2017-04-29 DIAGNOSIS — Z79899 Other long term (current) drug therapy: Secondary | ICD-10-CM | POA: Diagnosis not present

## 2017-04-29 MED ORDER — ACETAMINOPHEN ER 650 MG PO TBCR: 650.0000 mg | EXTENDED_RELEASE_TABLET | Freq: Three times a day (TID) | ORAL | 0 refills | Status: DC | PRN

## 2017-04-29 MED ORDER — FLUTICASONE PROPIONATE 50 MCG/ACT NA SUSP: 1.0000 | Freq: Every day | NASAL | 2 refills | Status: AC

## 2017-04-29 MED ORDER — MECLIZINE HCL 12.5 MG PO TABS: 12.5000 mg | ORAL_TABLET | Freq: Three times a day (TID) | ORAL | 0 refills | Status: DC | PRN

## 2017-04-29 NOTE — Discharge Instructions (Signed)
You were seen in the emergency department today for pain in your ears, change in hearing, and abnormal sound in the ears. Your physical exam did not show any significant abnormalities. We are unsure of the cause of your symptoms at this time. We have prescribed you multiple medications to treat your symptoms:   - Meclizine- this is to help with dizziness- you make take this every 8 hours as needed - Flonase- this is to help with nasal congestion that may be causing pressure in your ears- use this once in each nostril once per day - Tylenol- to be taken ever 8 hours as needed for pain.   Each of these medications has potential side effects, be sure to talk with your pharmacist regarding these possible side effects and interactions with your other medications. If you start to experience new or abnormal symptoms with these medicines see your primary care doctor or return to the emergency department.   WE have given you contact information for the ENT (ears, nose, and throat) doctor on call today. Call their office today or tomorrow to set up an appointment within the next few days. Return to the emergency department for new or worsening symptoms including, but not limited to vision changes, numbness, weakness, persistent dizziness, passing out, or any other concerns you may have.

## 2017-04-29 NOTE — ED Provider Notes (Signed)
[facility] EMERGENCY DEPARTMENT Provider Note   CSN: [id number] Arrival date & time: 04/29/17  1012     History   Chief Complaint Chief Complaint  Patient presents with  . Otalgia    HPI Scott Walter is a 47 y.o. male with a hx of anxiety, bipolar disorder, and paroxysmal atrial fibrillation on Xarelto presents to the ED with multiple ear complaints that started last night. Patient states that he got out of the shower last night with onset of muffled/decreased hearing to bilateral ears (R ear with baseline decreased hearing) and discomfort. States discomfort and change in hearing have been persistent. He has had intermittent tinnitus as well as dizziness. Dizziness occurs only with quick head movements, is described as room spinning, and resolves within a couple minutes. Patient tried Aleve last night without relief. No other alleviating/aggravating factors. Denies change in vision, numbness, weakness, or congestion.   HPI  Past Medical History:  Diagnosis Date  . Anxiety   . Bipolar disorder ([place])    h/o SI  . Chronic insomnia   . Chronic lower back pain   . Depression   . Dyslipidemia   . Essential hypertension   . ETOH abuse   . KGURKYHC(623.7)    "monthly" (01/26/2013)  . History of stomach ulcers ~ 2009  . Midsternal chest pain    a. 06/2014 Myoview: EF 50%, no ischemia/infarct.  . Migraine    "maybe 2 in my lifetime" (01/26/2013)  . PAF (paroxysmal atrial fibrillation) ([place])    a. Dx 05/2012;  b. 06/2014 Xarelto started in setting of ? TIA (CHA2DS2VASc = 3);  c. 06/2014 Echo: EF 60-65%, no rwma.  [name] PTSD (post-traumatic stress disorder)     Patient Active Problem List   Diagnosis Date Noted  . Preventative health care 09/06/2014  . Testicular discomfort 09/06/2014  . Low back pain 09/06/2014  . Dyslipidemia   . Chest pain 07/18/2014  . Visual disturbance 07/18/2014  . Paroxysmal atrial fibrillation ([place]) 07/18/2014  . Chest pain with moderate risk for  cardiac etiology 07/18/2014  . Amaurosis fugax of right eye 07/18/2014  . TIA (transient ischemic attack)   . Shortness of breath 03/02/2013  . Chronic insomnia 03/02/2013  . Bipolar disorder ([place]) 03/02/2013  . Precordial pain 01/26/2013  . Hyperglycemia 01/26/2013  . Essential hypertension 06/12/2012  . ETOH abuse 06/12/2012    Past Surgical History:  Procedure Laterality Date  . KNEE ARTHROSCOPY Right 09/2011       Home Medications    Prior to Admission medications   Medication Sig Start Date End Date Taking? Authorizing Provider  albuterol (PROVENTIL HFA;VENTOLIN HFA) 108 (90 Base) MCG/ACT inhaler Inhale 2 puffs into the lungs every 6 (six) hours as needed for wheezing or shortness of breath. 07/21/16   Fay Records, MD  Artificial Tear Ointment (DRY EYES OP) Apply to eye.    [provider]  atenolol (TENORMIN) 25 MG tablet Take 1 tablet (25 mg total) by mouth daily. 03/23/17   Fay Records, MD  Carboxymethylcellul-Glycerin (CLEAR EYES FOR DRY EYES OP) Apply to eye. [employer]    [provider]  cetirizine (ZYRTEC) 10 MG tablet Take 10 mg by mouth daily.    [provider]  fluticasone (FLONASE) 50 MCG/ACT nasal spray Place 1 spray into both nostrils daily.    [provider]  HYDROcodone-acetaminophen (NORCO/VICODIN) 5-325 MG tablet Take 1 tablet by mouth every 4 (four) hours as needed. for pain 08/30/16   [provider]  omeprazole (PRILOSEC) 20 MG capsule Take 1 capsule (20 mg total) by mouth daily. 07/21/16   Fay Records, MD  oxyCODONE-acetaminophen (ROXICET) 5-325 MG tablet Take 1 tablet by mouth every 8 (eight) hours as needed for severe pain. 09/02/16   Burchette, Alinda Sierras, MD  triamcinolone cream (KENALOG) 0.1 % Apply 1 application topically 2 (two) times daily as needed. 07/28/16   Burchette, Alinda Sierras, MD  trimethoprim-polymyxin b (POLYTRIM) ophthalmic solution Place 2 drops into both eyes every 4 (four) hours. 04/07/17   Burchette,  Alinda Sierras, MD  XARELTO 20 MG TABS tablet TAKE 1 TABLET BY MOUTH EVERY DAY WITH SUPPER 12/23/16   Fay Records, MD    Family History Family History  Problem Relation Age of Onset  . CAD Mother   . Diabetes Mother   . Emphysema Father   . Healthy Sister   . Healthy Brother   . Healthy Sister   . Healthy Sister   . Healthy Sister   . Healthy Sister   . Healthy Sister   . Healthy Sister   . Healthy Sister   . Healthy Brother   . Healthy Brother   . Healthy Brother   . HIV Brother     Social History Social History   Tobacco Use  . Smoking status: Former Smoker    Packs/day: 0.75    Years: 30.00    Pack years: 22.50    Types: Cigarettes    Last attempt to quit: 03/07/2017    Years since quitting: 0.1  . Smokeless tobacco: Former Systems developer    Types: Chew  . Tobacco comment: 01/26/2013 "quit chewing > 20 yr ago"  Substance Use Topics  . Alcohol use: Yes    Comment: occ  . Drug use: No     Allergies   Ambien [zolpidem tartrate] and Depakote [divalproex sodium]   Review of Systems Review of Systems  Constitutional: Negative for chills and fever.  HENT: Positive for ear pain, hearing loss and tinnitus. Negative for congestion and rhinorrhea.   Eyes: Negative for visual disturbance.  Neurological: Positive for dizziness. Negative for syncope, weakness, light-headedness and numbness.   Physical Exam Updated Vital Signs BP (!) 136/101 (BP Location: Right Arm)   Pulse 68   Temp 98.2 F (36.8 C) (Oral)   Resp 18   SpO2 99%   Physical Exam  Constitutional: He appears well-developed and well-nourished.  Non-toxic appearance. No distress.  HENT:  Head: Normocephalic and atraumatic.  Right Ear: Tympanic membrane and external ear normal. No drainage, swelling or tenderness. No foreign bodies. No mastoid tenderness. Tympanic membrane is not injected, not perforated, not erythematous, not retracted and not bulging.  Left Ear: Tympanic membrane and external ear normal. No  drainage, swelling or tenderness. No foreign bodies. No mastoid tenderness. Tympanic membrane is not injected, not perforated, not erythematous, not retracted and not bulging.  Patient able to pass whisper voice test with the L and the R ear each.  Eyes: Conjunctivae are normal. Pupils are equal, round, and reactive to light. Right eye exhibits no discharge. Left eye exhibits no discharge. Right eye exhibits nystagmus (horizontal). Left eye exhibits nystagmus (horizontal).  EOM with horizontal nystagmus. No vertical/rotation component.   Neck: Normal range of motion. Neck supple.  Neurological: He is alert.  Clear speech. No facial droop. CN III-XII grossly intact. Sensation grossly intact to bilateral upper and lower extremities.  5 out of 5 grip strength bilaterally.  Gait is steady.  Negative  pronator drift.   Psychiatric: He has a normal mood and affect. His behavior is normal. Thought content normal.  Nursing note and vitals reviewed.   ED Treatments / Results  Labs (all labs ordered are listed, but only abnormal results are displayed) Labs Reviewed - No data to display  EKG  EKG Interpretation None       Radiology No results found.  Procedures Procedures (including critical care time)  Medications Ordered in ED Medications - No data to display   Initial Impression / Assessment and Plan / ED Course  I have reviewed the triage vital signs and the nursing notes.  Pertinent labs & imaging results that were available during my care of the patient were reviewed by me and considered in my medical decision making (see chart for details).   Patient presents with complaints of ear pain, muffled hearing, tinnitus, and dizziness. Patient is nontoxic appearing, in no apparent distress, vitals WNL other than somewhat elevated blood pressure- no indication of HTN emergency, patient aware of need for recheck. Patient with fairly benign physical exam- no evidence of AOM, AOE, TM  perforation, or mastoiditis. Patient's hearing is grossly intact. Patient with complaint of intermittent dizziness which is brought on by head movements and resolves spontaneously- on exam horizontal nystagmus with EOM- no vertical or rotational nystagmus, normal neuro exam, doubt CVA or other central cause of vertigo. Unclear definitive etiology of patient's symptoms at this time, given constellation of symptoms suspicious for possible Meniere's. Discussed findings with supervising physician Dr. Alvino Chapel- in agreement with plan for supportive treatment and ENT follow up. Will discharge with meclizine, flonase, and tylenol. I discussed treatment plan, need for ENT follow-up, and return precautions with the patient. Provided opportunity for questions, patient confirmed understanding and is in agreement with plan.    Final Clinical Impressions(s) / ED Diagnoses   Final diagnoses:  Otalgia of both ears    ED Discharge Orders        Ordered    acetaminophen (TYLENOL 8 HOUR) 650 MG CR tablet  Every 8 hours PRN     04/29/17 1316    fluticasone (FLONASE) 50 MCG/ACT nasal spray  Daily     04/29/17 1316    meclizine (ANTIVERT) 12.5 MG tablet  3 times daily PRN     04/29/17 [address], [place] R, PA-C 04/29/17 1943    Davonna Belling, MD 04/30/17 1636

## 2017-04-29 NOTE — ED Triage Notes (Signed)
Pt reports bilateral ear pain, left worse than right, x yesterday. Denies any fever or other c/o.

## 2017-05-05 DIAGNOSIS — H903 Sensorineural hearing loss, bilateral: Secondary | ICD-10-CM | POA: Insufficient documentation

## 2017-05-05 DIAGNOSIS — R42 Dizziness and giddiness: Secondary | ICD-10-CM | POA: Insufficient documentation

## 2017-05-05 DIAGNOSIS — H9313 Tinnitus, bilateral: Secondary | ICD-10-CM | POA: Diagnosis not present

## 2017-05-06 ENCOUNTER — Other Ambulatory Visit: Payer: Self-pay | Admitting: Otolaryngology

## 2017-05-06 DIAGNOSIS — R42 Dizziness and giddiness: Secondary | ICD-10-CM

## 2017-05-11 DIAGNOSIS — F1010 Alcohol abuse, uncomplicated: Secondary | ICD-10-CM | POA: Diagnosis not present

## 2017-05-14 ENCOUNTER — Telehealth: Payer: Self-pay | Admitting: Family Medicine

## 2017-05-14 DIAGNOSIS — S43432A Superior glenoid labrum lesion of left shoulder, initial encounter: Secondary | ICD-10-CM | POA: Diagnosis not present

## 2017-05-14 DIAGNOSIS — M25512 Pain in left shoulder: Secondary | ICD-10-CM | POA: Diagnosis not present

## 2017-05-14 DIAGNOSIS — M13812 Other specified arthritis, left shoulder: Secondary | ICD-10-CM | POA: Diagnosis not present

## 2017-05-14 NOTE — Telephone Encounter (Signed)
Pre-operative Clearance form to be filled out, placed in dr's folder.  Fax form to: [phone], Attn: Dr Stann Mainland

## 2017-05-15 ENCOUNTER — Encounter: Payer: Self-pay | Admitting: Family Medicine

## 2017-05-15 ENCOUNTER — Telehealth: Payer: Self-pay | Admitting: Family Medicine

## 2017-05-15 NOTE — Telephone Encounter (Signed)
Copied from [place] [phone]. Topic: Inquiry >> May 15, 2017  4:12 PM Arletha Grippe wrote: Reason for CRM: [phone] Pt is calling about ppw that was  dropped off yesterday, explained aht ppw time is 5-7 days. Pt needs this back as soon as possible so he can have surgery. He is missing work.  Please call when ready

## 2017-05-18 DIAGNOSIS — F1010 Alcohol abuse, uncomplicated: Secondary | ICD-10-CM | POA: Diagnosis not present

## 2017-05-18 NOTE — Telephone Encounter (Signed)
See phone note.  Left message on machine for patient to schedule an appointment.

## 2017-05-18 NOTE — Telephone Encounter (Signed)
Appointment made

## 2017-05-18 NOTE — Telephone Encounter (Signed)
Needs office follow up for surgical clearance.

## 2017-05-18 NOTE — Telephone Encounter (Signed)
Left message on machine for patient.  Patient will need an office visit for surgical clearance.  30 minute appointment needed.  CRM created

## 2017-05-19 ENCOUNTER — Ambulatory Visit (INDEPENDENT_AMBULATORY_CARE_PROVIDER_SITE_OTHER): Payer: Federal, State, Local not specified - PPO | Admitting: Family Medicine

## 2017-05-19 ENCOUNTER — Encounter: Payer: Self-pay | Admitting: Family Medicine

## 2017-05-19 VITALS — BP 110/80 | HR 88 | Temp 97.8°F | Wt 265.3 lb

## 2017-05-19 DIAGNOSIS — I10 Essential (primary) hypertension: Secondary | ICD-10-CM

## 2017-05-19 DIAGNOSIS — Z01818 Encounter for other preprocedural examination: Secondary | ICD-10-CM

## 2017-05-19 DIAGNOSIS — I480 Paroxysmal atrial fibrillation: Secondary | ICD-10-CM
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
## 2017-05-21 ENCOUNTER — Ambulatory Visit
Admission: RE | Admit: 2017-05-21 | Discharge: 2017-05-21 | Disposition: A | Discharge status: Home / Self Care | Payer: Federal, State, Local not specified - PPO | Source: Ambulatory Visit | Attending: Otolaryngology | Admitting: Otolaryngology

## 2017-05-21 DIAGNOSIS — R42 Dizziness and giddiness: Secondary | ICD-10-CM

## 2017-05-21 DIAGNOSIS — H9192 Unspecified hearing loss, left ear: Secondary | ICD-10-CM | POA: Diagnosis not present

## 2017-05-21 MED ORDER — GADOBENATE DIMEGLUMINE 529 MG/ML IV SOLN
12.0000 mL | Freq: Once | INTRAVENOUS | Status: AC | PRN
  Administered 2017-05-21: 12 mL via INTRAVENOUS

## 2017-05-23 DIAGNOSIS — S43432A Superior glenoid labrum lesion of left shoulder, initial encounter: Secondary | ICD-10-CM | POA: Insufficient documentation

## 2017-05-26 ENCOUNTER — Telehealth: Payer: Self-pay | Admitting: Family Medicine

## 2017-05-26 NOTE — Telephone Encounter (Signed)
Copied from [place] [phone]. Topic: Quick Communication - See Telephone Encounter >> May 26, 2017 11:30 AM Percell Belt A wrote: CRM for notification. See Telephone encounter for: 05/26/17. Pt called in and said that his orthopedic has not rec'd the clearance for yet.  Was this form faxed or can it be refaxed?  [facility] attn Cecille Rubin.  He did not have fax number

## 2017-05-27 NOTE — Telephone Encounter (Signed)
Scott Walter re-faxed the form 05/26/17

## 2017-06-04 ENCOUNTER — Ambulatory Visit (INDEPENDENT_AMBULATORY_CARE_PROVIDER_SITE_OTHER): Payer: Federal, State, Local not specified - PPO | Admitting: Internal Medicine

## 2017-06-04 ENCOUNTER — Encounter: Payer: Self-pay | Admitting: Internal Medicine

## 2017-06-04 VITALS — BP 158/100 | HR 78 | Ht 69.0 in | Wt 260.8 lb

## 2017-06-04 DIAGNOSIS — I10 Essential (primary) hypertension: Secondary | ICD-10-CM | POA: Diagnosis not present

## 2017-06-04 DIAGNOSIS — Z01810 Encounter for preprocedural cardiovascular examination: Secondary | ICD-10-CM | POA: Diagnosis not present

## 2017-06-04 DIAGNOSIS — I480 Paroxysmal atrial fibrillation: Secondary | ICD-10-CM | POA: Diagnosis not present

## 2017-06-04 DIAGNOSIS — E782 Mixed hyperlipidemia: Secondary | ICD-10-CM

## 2017-06-04 DIAGNOSIS — Z720 Tobacco use: Secondary | ICD-10-CM

## 2017-06-04 MED ORDER — SUCRALFATE 1 G PO TABS: 1.0000 g | ORAL_TABLET | Freq: Three times a day (TID) | ORAL | 0 refills | Status: DC | PRN

## 2017-06-04 MED ORDER — OMEPRAZOLE 20 MG PO CPDR: 20.0000 mg | DELAYED_RELEASE_CAPSULE | Freq: Two times a day (BID) | ORAL | 3 refills | Status: DC

## 2017-06-04 NOTE — Progress Notes (Addendum)
Cardiology Office Note   Date:  06/04/2017   ID:  [name], DOB 1970/10/17, MRN [id number]  PCP:  Eulas Post, MD  Cardiologist:   Dorris Carnes, MD   F/U of PAF     History of Present Illness: Scott Walter is a 47 y.o. male with a history ofPAF, HTN ? TIA, anxiety, bopolar disorderd  I saw him in June 2018  Since seen he has had occasional spells of atrila fib  Self limited  Breathing is unchanged   He still gets SOB with exertion  Does not want to do things   No change  He was seen in ED in march for decreased hearing   MRI had concerns he said for MS   Hs appt soon in neuroogy  He is also being seen by ortho for shoulder surgery   Does note refulx   Worse        Current Meds  Medication Sig  . albuterol (PROVENTIL HFA;VENTOLIN HFA) 108 (90 Base) MCG/ACT inhaler Inhale 2 puffs into the lungs every 6 (six) hours as needed for wheezing or shortness of breath.  . Apple Cider Vinegar 188 MG CAPS Take 1 capsule by mouth.  . Artificial Tear Ointment (DRY EYES OP) Apply to eye.  [name] atenolol (TENORMIN) 25 MG tablet Take 1 tablet (25 mg total) by mouth daily.  . cetirizine (ZYRTEC) 10 MG tablet Take 10 mg by mouth daily.  . fluticasone (FLONASE) 50 MCG/ACT nasal spray Place 1 spray into both nostrils daily.  . Melatonin 10 MG CAPS Take 3 capsules by mouth at bedtime as needed.  [name] omeprazole (PRILOSEC) 20 MG capsule Take 1 capsule (20 mg total) by mouth daily.  [name] triamcinolone cream (KENALOG) 0.1 % Apply 1 application topically 2 (two) times daily as needed.  Alveda Reasons 20 MG TABS tablet TAKE 1 TABLET BY MOUTH EVERY DAY WITH SUPPER     Allergies:   Ambien [zolpidem tartrate] and Depakote [divalproex sodium]   Past Medical History:  Diagnosis Date  . Anxiety   . Bipolar disorder ([place])    h/o SI  . Chronic insomnia   . Chronic lower back pain   . Depression   . Dyslipidemia   . Essential hypertension   . ETOH abuse   . NFAOZHYQ(657.8)    "monthly" (01/26/2013)    . History of stomach ulcers ~ 2009  . Midsternal chest pain    a. 06/2014 Myoview: EF 50%, no ischemia/infarct.  . Migraine    "maybe 2 in my lifetime" (01/26/2013)  . PAF (paroxysmal atrial fibrillation) ([place])    a. Dx 05/2012;  b. 06/2014 Xarelto started in setting of ? TIA (CHA2DS2VASc = 3);  c. 06/2014 Echo: EF 60-65%, no rwma.  [name] PTSD (post-traumatic stress disorder)     Past Surgical History:  Procedure Laterality Date  . KNEE ARTHROSCOPY Right 09/2011     Social History:  The patient  reports that he quit smoking about 2 months ago. His smoking use included cigarettes. He has a 22.50 pack-year smoking history. He has quit using smokeless tobacco. His smokeless tobacco use included chew. He reports that he drinks alcohol. He reports that he does not use drugs.   Family History:  The patient's family history includes CAD in his mother; Diabetes in his mother; Emphysema in his father; HIV in his brother; Healthy in his brother, brother, brother, brother, sister, sister, sister, sister, sister, sister, sister, and sister.    ROS:  Please see  the history of present illness. All other systems are reviewed and  Negative to the above problem except as noted.    PHYSICAL EXAM: VS:  BP (!) 158/100   Pulse 78   Ht 5\' 9"  (1.753 m)   Wt 260 lb 12.8 oz (118.3 kg)   SpO2 97%   BMI 38.51 kg/m   GEN: Morbidly obese 48 yo , in no acute distress  HEENT: normal  Neck: JVP normal  No, carotid bruits, or masses Cardiac: RRR; no murmurs, rubs, or gallops,no edema  Respirator   clear to auscultation bilaterally, normal work of breathing GI: soft, nontender, nondistended, + BS  No hepatomegaly  MS: no deformity Moving all extremities   Skin: warm and dry, no rash Neuro:  Strength and sensation are intact Psych: euthymic mood, full affect   EKG:  EKG is not  ordered today.   Lipid Panel    Component Value Date/Time   CHOL 184 07/28/2016 1546   TRIG 187.0 (H) 07/28/2016 1546   HDL 41.10  07/28/2016 1546   CHOLHDL 4 07/28/2016 1546   VLDL 37.4 07/28/2016 1546   LDLCALC 105 (H) 07/28/2016 1546      Wt Readings from Last 3 Encounters:  06/04/17 260 lb 12.8 oz (118.3 kg)  05/19/17 265 lb 4.8 oz (120.3 kg)  04/07/17 262 lb 6.4 oz (119 kg)      ASSESSMENT AND PLAN:  1  PAF Occasional spells   Self limited   Keep on same meds  (atenolol and Xarelto)  2  Chest discomfort  Denies  Does get SOB with activity   This is chronic   No change   He had a normal myovue in 2015   Notes no progression from when teast was done    3  HTN  BP is elevted today  158/100   This is the highest I have seen   Follow     4  HL LDL is OK at 105  5  Tob  Counselled on stopping   6   GERD   Add carafate PRN  Omeprazole increase to BID    6  HCM   I encouraged him to stay active, try to get wt down   7  Preop cardiac eval:   From a cardiac standpoint I think pt is low risk for ortho surgery and ok to proceed   No defintie TIA in past  Hold Xarelto 48 hours before surgyer.    Current medicines are reviewed at length with the patient today.  The patient does not have concerns regarding medicines.  Signed, Dorris Carnes, MD  06/04/2017 9:27 AM    [address], [place], [place]  [address] Phone: [phone]; Fax: [phone]

## 2017-06-04 NOTE — Patient Instructions (Addendum)
Your physician has recommended you make the following change in your medication:  1.) increase omeprazole to twice a day before meals 2.) carafate (sucralfate) 1 g 3 times a day before meals as needed for breakthrough symptoms  Your physician wants you to follow-up in: Nov-Dec with Dr. [name].  You will receive a reminder letter in the mail two months in advance. If you don't receive a letter, please call our office to schedule the follow-up appointment.

## 2017-06-08 DIAGNOSIS — F1010 Alcohol abuse, uncomplicated: Secondary | ICD-10-CM | POA: Diagnosis not present

## 2017-06-09 ENCOUNTER — Encounter: Payer: Self-pay | Admitting: Internal Medicine

## 2017-06-10 ENCOUNTER — Ambulatory Visit: Payer: Federal, State, Local not specified - PPO | Admitting: Neurology

## 2017-06-10 ENCOUNTER — Other Ambulatory Visit: Payer: Self-pay

## 2017-06-10 ENCOUNTER — Encounter: Payer: Self-pay | Admitting: Neurology

## 2017-06-10 ENCOUNTER — Telehealth: Payer: Self-pay | Admitting: *Deleted

## 2017-06-10 VITALS — BP 141/95 | HR 63 | Resp 18 | Ht 69.0 in | Wt 262.5 lb

## 2017-06-10 DIAGNOSIS — G4733 Obstructive sleep apnea (adult) (pediatric): Secondary | ICD-10-CM

## 2017-06-10 DIAGNOSIS — F5104 Psychophysiologic insomnia: Secondary | ICD-10-CM | POA: Diagnosis not present

## 2017-06-10 DIAGNOSIS — I480 Paroxysmal atrial fibrillation: Secondary | ICD-10-CM | POA: Diagnosis not present

## 2017-06-10 DIAGNOSIS — G459 Transient cerebral ischemic attack, unspecified: Secondary | ICD-10-CM

## 2017-06-10 MED ORDER — ARMODAFINIL 250 MG PO TABS: 250.0000 mg | ORAL_TABLET | Freq: Every day | ORAL | 5 refills | Status: DC

## 2017-06-10 NOTE — Telephone Encounter (Signed)
Initiated PA armodafinil on covermymeds. Key: [id number]. Submitted demographic info and received the following response:  "Your demographic data has been sent to [employer] successfully. They will respond shortly with your clinical questions and you will be notified by email when available. You can also check for an update later by opening this request from your dashboard. Please do not fax or call FEP to resubmit this request."

## 2017-06-10 NOTE — Progress Notes (Signed)
Asked by Dr. [name] to forward this ov note to ortho.  Left message on voice mail for patient to call back to provide contact information for orthopedic surgeon.

## 2017-06-10 NOTE — Progress Notes (Signed)
[facility]  PATIENT: Scott Walter DOB: [birth year]  REFERRING DOCTOR OR PCP:  Dr. Elease Hashimoto (PCP); Dr. Janace Hoard (ENT) SOURCE: Patient, notes from Dr. Jerline Pain, imaging results, MRI images on PACS personally reviewed  _________________________________   HISTORICAL  CHIEF COMPLAINT:  Chief Complaint  Patient presents with  . Abnormal MRI Brain    Lost hearing in his left ear around mid March.  Intermittent left eye pain, blurry and double vision onset several mos. ago.  Fatigue, balance/gait disturbance.  Had abnormal MRI brain and is here to discuss results/fim  . History of TIA    Has A-Fib, hx. of TIA 3 yrs. ago; on Xarelto for same/fim    HISTORY OF PRESENT ILLNESS:  I had the pleasure of seeing your patient, Scott Walter, and [facility] for neurologic consultation regarding his episode of hearing loss with vertigo and vision changes and abnormal MRI.[name]  He is a 47 year old man who had the sudden loss of hearing and vertigo in mid May.  Before this, for a couple months, he was having intermittent dizziness but not other symptoms.  He also noted bilateral blurry vision and diplopia.   Also, shortly after the symptoms started, he noted a headache on his left.   A few days later, the hearing slowly returned but he flet sensitive to loud noises.   Vertigo also lasted a couple days.   Blurry vision improved but is not back 100% yet.  He sees traces of light in his vision, in both eyes.    He has felt a little clumsier since the event.  He has had AFib since Summer 2015 after he presented with substernal chest pain.  He was placed on metoprolol but was not placed on anticoagulation initially.  Then on 07/18/2014, he lost vision on his left temporarillyand was taken to the ED.   He was found to have AFib and was admitted for possible stroke.    He has been on Xarelto since then.       He has some weakness in the left arm felt to be due to a shoulder injury  for which he will soon be having surgery.   He has tiredness and sleepiness.  He has a physical job and has had more trouble doing this.  He has OSA but cannot tolerate CPAP.    He snores and has witnessed OSA.    He also has insomnia.   He has more trouble falling asleep than staying asleep.   Some medications have given him a hangover or have not helped. Trazodone did not helpl.   Ambien caused hallucinations.   Benadryl made him feel hyperactive.    Melatonin has not helped.    He sleeps 2-3 hours sleep most nights.  He has some daytime sleepiness though does not doze off most days.  He also has depression.  I personally reviewed the MRI of the brain performed 05/21/2017.  It shows a single periventricular focus in the right parietal region that was also present on the MRI from 07/18/2014 and is unchanged.  There are no other foci in the brain that would be worrisome for stroke or demyelination.   The internal auditory canals appear normal.    REVIEW OF SYSTEMS: Constitutional: No fevers, chills, sweats, or change in appetite.   He has fatigue and occasional sleepiness Eyes: No visual changes, double vision, eye pain Ear, nose and throat: No hearing loss, ear pain, nasal congestion, sore throat Cardiovascular: He has paroxysmal atrial  fibrillation  respiratory: No shortness of breath at rest or with exertion.   He has OSA.    GastrointestinaI: No nausea, vomiting, diarrhea, abdominal pain, fecal incontinence Genitourinary: No dysuria, urinary retention or frequency.  No nocturia. Musculoskeletal: No neck pain, back pain Integumentary: No rash, pruritus, skin lesions Neurological: as above Psychiatric: No depression at this time.  No anxiety Endocrine: No palpitations, diaphoresis, change in appetite, change in weigh or increased thirst Hematologic/Lymphatic: No anemia, purpura, petechiae. Allergic/Immunologic: No itchy/runny eyes, nasal congestion, recent allergic reactions,  rashes  ALLERGIES: Allergies  Allergen Reactions  . Ambien [Zolpidem Tartrate] Other (See Comments)    "Nightmares and mood swings  . Depakote [Divalproex Sodium] Anaphylaxis and Shortness Of Breath    HOME MEDICATIONS:  Current Outpatient Medications:  .  albuterol (PROVENTIL HFA;VENTOLIN HFA) 108 (90 Base) MCG/ACT inhaler, Inhale 2 puffs into the lungs every 6 (six) hours as needed for wheezing or shortness of breath., Disp: 1 Inhaler, Rfl: 2 .  Apple Cider Vinegar 188 MG CAPS, Take 1 capsule by mouth., Disp: , Rfl:  .  Artificial Tear Ointment (DRY EYES OP), Apply to eye., Disp: , Rfl:  .  atenolol (TENORMIN) 25 MG tablet, Take 1 tablet (25 mg total) by mouth daily., Disp: 90 tablet, Rfl: 3 .  cetirizine (ZYRTEC) 10 MG tablet, Take 10 mg by mouth daily., Disp: , Rfl:  .  fluticasone (FLONASE) 50 MCG/ACT nasal spray, Place 1 spray into both nostrils daily., Disp: 16 g, Rfl: 2 .  Melatonin 10 MG CAPS, Take 3 capsules by mouth at bedtime as needed., Disp: , Rfl:  .  omeprazole (PRILOSEC) 20 MG capsule, Take 1 capsule (20 mg total) by mouth 2 (two) times daily before a meal., Disp: 90 capsule, Rfl: 3 .  OVER THE COUNTER MEDICATION, [employer] Apple Cider Vinegar, Disp: , Rfl:  .  OVER THE COUNTER MEDICATION, [place] Superior Herbs Moringa Oleifera Supplement, Disp: , Rfl:  .  sucralfate (CARAFATE) 1 g tablet, Take 1 tablet (1 g total) by mouth 3 (three) times daily with meals as needed., Disp: 90 tablet, Rfl: 0 .  triamcinolone cream (KENALOG) 0.1 %, Apply 1 application topically 2 (two) times daily as needed., Disp: 80 g, Rfl: 2 .  XARELTO 20 MG TABS tablet, TAKE 1 TABLET BY MOUTH EVERY DAY WITH SUPPER, Disp: 30 tablet, Rfl: 5 .  Armodafinil 250 MG tablet, Take 1 tablet (250 mg total) by mouth daily., Disp: 30 tablet, Rfl: 5  PAST MEDICAL HISTORY: Past Medical History:  Diagnosis Date  . Anxiety   . Bipolar disorder ([place])    h/o SI  . Chronic insomnia   . Chronic lower back pain    . Depression   . Dyslipidemia   . Essential hypertension   . ETOH abuse   . AJGOTLXB(262.0)    "monthly" (01/26/2013)  . History of stomach ulcers ~ 2009  . Midsternal chest pain    a. 06/2014 Myoview: EF 50%, no ischemia/infarct.  . Migraine    "maybe 2 in my lifetime" (01/26/2013)  . PAF (paroxysmal atrial fibrillation) ([place])    a. Dx 05/2012;  b. 06/2014 Xarelto started in setting of ? TIA (CHA2DS2VASc = 3);  c. 06/2014 Echo: EF 60-65%, no rwma.  [name] PTSD (post-traumatic stress disorder)     PAST SURGICAL HISTORY: Past Surgical History:  Procedure Laterality Date  . KNEE ARTHROSCOPY Right 09/2011    FAMILY HISTORY: Family History  Problem Relation Age of Onset  . CAD Mother   .  Diabetes Mother   . Emphysema Father   . Healthy Sister   . Healthy Brother   . Healthy Sister   . Healthy Sister   . Healthy Sister   . Healthy Sister   . Healthy Sister   . Healthy Sister   . Healthy Sister   . Healthy Brother   . Healthy Brother   . Healthy Brother   . HIV Brother     SOCIAL HISTORY:  Social History   Socioeconomic History  . Marital status: Married    Spouse name: Not on file  . Number of children: Not on file  . Years of education: Not on file  . Highest education level: Not on file  Occupational History  . Not on file  Social Needs  . Financial resource strain: Not on file  . Food insecurity:    Worry: Not on file    Inability: Not on file  . Transportation needs:    Medical: Not on file    Non-medical: Not on file  Tobacco Use  . Smoking status: Former Smoker    Packs/day: 0.75    Years: 30.00    Pack years: 22.50    Types: Cigarettes    Last attempt to quit: 03/07/2017    Years since quitting: 0.2  . Smokeless tobacco: Former Systems developer    Types: Chew  . Tobacco comment: 01/26/2013 "quit chewing > 20 yr ago"  Substance and Sexual Activity  . Alcohol use: Yes    Comment: occ  . Drug use: No  . Sexual activity: Not Currently  Lifestyle  . Physical  activity:    Days per week: Not on file    Minutes per session: Not on file  . Stress: Not on file  Relationships  . Social connections:    Talks on phone: Not on file    Gets together: Not on file    Attends religious service: Not on file    Active member of club or organization: Not on file    Attends meetings of clubs or organizations: Not on file    Relationship status: Not on file  . Intimate partner violence:    Fear of current or ex partner: Not on file    Emotionally abused: Not on file    Physically abused: Not on file    Forced sexual activity: Not on file  Other Topics Concern  . Not on file  Social History Narrative  . Not on file     PHYSICAL EXAM  Vitals:   06/10/17 1323  BP: (!) 141/95  Pulse: 63  Resp: 18  Weight: 262 lb 8 oz (119.1 kg)  Height: 5\' 9"  (1.753 m)    Body mass index is 38.76 kg/m.   General: The patient is well-developed and well-nourished and in no acute distress.  The pharynx is Mallampati 2  Eyes:  Funduscopic exam shows normal optic discs and retinal vessels.  Neck: The neck is supple, no carotid bruits are noted.  The neck is nontender.  Cardiovascular: The heart has a regular rate and rhythm with a normal S1 and S2. There were no murmurs, gallops or rubs. Lungs are clear to auscultation.  Skin: Extremities are without significant edema.  Musculoskeletal:  Back is nontender  Neurologic Exam  Mental status: The patient is alert and oriented x 3 at the time of the examination. The patient has apparent normal recent and remote memory, with an apparently normal attention span and concentration ability.   Speech  is normal.  Cranial nerves: Extraocular movements are full. Pupils are equal, round, and reactive to light and accomodation.  He reports very slight color asymmetry.  Facial symmetry is present. There is good facial sensation to soft touch bilaterally.Facial strength is normal.  Trapezius and sternocleidomastoid strength is  normal. No dysarthria is noted.  The tongue is midline, and the patient has symmetric elevation of the soft palate. No obvious hearing deficits are noted.  Motor:  Muscle bulk is normal.   Tone is normal. Strength is  5 / 5 in all 4 extremities.   Sensory: Sensory testing is intact to pinprick, soft touch and vibration sensation in all 4 extremities.  Coordination: Cerebellar testing reveals good finger-nose-finger and heel-to-shin bilaterally.  Gait and station: Station is normal.   Gait is normal. Tandem gait is slightly wide. Romberg is negative.   Reflexes: Deep tendon reflexes are symmetric and normal bilaterally.   Plantar responses are flexor.    DIAGNOSTIC DATA (LABS, IMAGING, TESTING) - I reviewed patient records, labs, notes, testing and imaging myself where available.  Lab Results  Component Value Date   WBC 6.5 10/07/2016   HGB 14.9 10/07/2016   HCT 42.6 10/07/2016   MCV 87.7 10/07/2016   PLT 215 10/07/2016      Component Value Date/Time   NA 136 10/07/2016 1711   K 3.9 10/07/2016 1711   CL 105 10/07/2016 1711   CO2 23 10/07/2016 1711   GLUCOSE 100 (H) 10/07/2016 1711   BUN 10 10/07/2016 1711   CREATININE 0.79 10/07/2016 1711   CALCIUM 9.1 10/07/2016 1711   PROT 7.3 07/28/2016 1546   ALBUMIN 4.4 07/28/2016 1546   AST 16 07/28/2016 1546   ALT 30 07/28/2016 1546   ALKPHOS 79 07/28/2016 1546   BILITOT 0.7 07/28/2016 1546   GFRNONAA >60 10/07/2016 1711   GFRAA >60 10/07/2016 1711   Lab Results  Component Value Date   CHOL 184 07/28/2016   HDL 41.10 07/28/2016   LDLCALC 105 (H) 07/28/2016   TRIG 187.0 (H) 07/28/2016   CHOLHDL 4 07/28/2016   Lab Results  Component Value Date   HGBA1C 6.0 (H) 07/18/2014   No results found for: VITAMINB12 Lab Results  Component Value Date   TSH 0.42 07/28/2016       ASSESSMENT AND PLAN  TIA (transient ischemic attack)  Paroxysmal atrial fibrillation (HCC)  Chronic insomnia  OSA (obstructive sleep  apnea)  In summary, Mr. Scott Walter is a 47 year old man with paroxysmal atrial fibrillation who had a 4-day episode of hearing loss associated with vertigo and blurry vision.  The hearing loss has resolved and the other symptoms are much better.  He currently has a normal neurologic examination.  I do not have a definite explanation for his symptoms.  The single small focus on MRI is really within normal limits for his age and MS is very unlikely.  The MRI is unchanged in that same focus was noted 3 years ago.  Possibly he had a complicated migraine as he also was experiencing a headache that followed the hearing loss.  I cannot rule out a reversible ischemic neurologic deficit from his A. fib though the time course of 4 days is probably too long.  I do not think any further evaluation for MS is necessary.  Although he was not referred for sleep issues, he has a history of severe OSA that is untreated.  In middle age man, OSA can be linked to a much higher risk of atrial  fibrillation and cardiac disease.  I had a long discussion with him and his wife about the need to do treat his OSA.  He has tried and failed multiple different masks does not think that he would be able to use CPAP.  I advised him to call the ENT office specifically about sleep apnea (if he has trouble getting in we could refer back) as surgery might be a better option for him.  No follow-up was made but he is advised to call back if he has any new or worsening neurologic symptoms.  Thank you for asking me to see Mr. Scott Walter.  Please let me know if I can be of further assistance with him or other patients in the future.    Sherrey North A. Felecia Shelling, MD, [facility] 9/79/8921, 1:94 PM Certified in Neurology, Clinical Neurophysiology, Sleep Medicine, Pain Medicine and Neuroimaging  [facility] [address] [place], [place] [address] [phone]

## 2017-06-11 ENCOUNTER — Encounter: Payer: Self-pay | Admitting: Internal Medicine

## 2017-06-11 ENCOUNTER — Telehealth: Payer: Self-pay | Admitting: Neurology

## 2017-06-11 NOTE — Telephone Encounter (Signed)
Records faxed to [facility] [place] [facility] [place] [phone], she advised the pt was having surgery Monday. FYI

## 2017-06-11 NOTE — Telephone Encounter (Signed)
Submitted clinical information. PA approved via [employer] employee program effective 05/12/2017-06/11/2018. Phone: [phone]. Fax: [phone].   Notified [facility] in [place], [place] via fax about approval. Fax: [phone]. Received fax confirmation.

## 2017-06-11 NOTE — Progress Notes (Signed)
Faxed ov note to Victorino December, MD, pts surgeon via [place] fax.

## 2017-06-12 NOTE — Telephone Encounter (Signed)
Medical records faxed on Friday.

## 2017-06-15 DIAGNOSIS — M7542 Impingement syndrome of left shoulder: Secondary | ICD-10-CM | POA: Diagnosis not present

## 2017-06-15 DIAGNOSIS — M24112 Other articular cartilage disorders, left shoulder: Secondary | ICD-10-CM | POA: Diagnosis not present

## 2017-06-15 DIAGNOSIS — M659 Synovitis and tenosynovitis, unspecified: Secondary | ICD-10-CM | POA: Diagnosis not present

## 2017-06-15 DIAGNOSIS — S46212A Strain of muscle, fascia and tendon of other parts of biceps, left arm, initial encounter: Secondary | ICD-10-CM | POA: Diagnosis not present

## 2017-06-15 DIAGNOSIS — G8918 Other acute postprocedural pain: Secondary | ICD-10-CM | POA: Diagnosis not present

## 2017-06-15 DIAGNOSIS — M19012 Primary osteoarthritis, left shoulder: Secondary | ICD-10-CM | POA: Diagnosis not present

## 2017-06-15 DIAGNOSIS — M94212 Chondromalacia, left shoulder: Secondary | ICD-10-CM | POA: Diagnosis not present

## 2017-06-15 DIAGNOSIS — M7552 Bursitis of left shoulder: Secondary | ICD-10-CM | POA: Diagnosis not present

## 2017-06-22 DIAGNOSIS — F1010 Alcohol abuse, uncomplicated: Secondary | ICD-10-CM | POA: Diagnosis not present

## 2017-06-23 ENCOUNTER — Other Ambulatory Visit: Payer: Self-pay | Admitting: Internal Medicine

## 2017-06-23 NOTE — Telephone Encounter (Signed)
Age 47 years WT 119.1kg 06/10/2017 Saw Dr Dorris Carnes on 06/04/2017 10/14/2016 SrCr 0.79 10/14/2016 Hgb 14.9 HCT 42.6 CrCl 196.82 Refill done for Xarelto 20 mg daily as requested

## 2017-06-25 DIAGNOSIS — M25612 Stiffness of left shoulder, not elsewhere classified: Secondary | ICD-10-CM | POA: Diagnosis not present

## 2017-06-25 DIAGNOSIS — M25512 Pain in left shoulder: Secondary | ICD-10-CM | POA: Diagnosis not present

## 2017-06-26 ENCOUNTER — Other Ambulatory Visit: Payer: Self-pay | Admitting: Internal Medicine

## 2017-06-30 DIAGNOSIS — M25512 Pain in left shoulder: Secondary | ICD-10-CM | POA: Diagnosis not present

## 2017-06-30 DIAGNOSIS — M25612 Stiffness of left shoulder, not elsewhere classified: Secondary | ICD-10-CM | POA: Diagnosis not present

## 2017-07-03 ENCOUNTER — Other Ambulatory Visit: Payer: Self-pay | Admitting: Internal Medicine

## 2017-07-07 DIAGNOSIS — M25612 Stiffness of left shoulder, not elsewhere classified: Secondary | ICD-10-CM | POA: Diagnosis not present

## 2017-07-07 DIAGNOSIS — M25512 Pain in left shoulder: Secondary | ICD-10-CM | POA: Diagnosis not present

## 2017-07-10 ENCOUNTER — Encounter (HOSPITAL_BASED_OUTPATIENT_CLINIC_OR_DEPARTMENT_OTHER): Payer: Self-pay

## 2017-07-10 ENCOUNTER — Other Ambulatory Visit: Payer: Self-pay

## 2017-07-10 ENCOUNTER — Emergency Department (HOSPITAL_BASED_OUTPATIENT_CLINIC_OR_DEPARTMENT_OTHER)
Admission: EM | Admit: 2017-07-10 | Discharge: 2017-07-10 | Disposition: A | Discharge status: Home / Self Care | Payer: Federal, State, Local not specified - PPO | Attending: Emergency Medicine | Admitting: Emergency Medicine

## 2017-07-10 ENCOUNTER — Emergency Department (HOSPITAL_BASED_OUTPATIENT_CLINIC_OR_DEPARTMENT_OTHER): Payer: Federal, State, Local not specified - PPO

## 2017-07-10 DIAGNOSIS — R0602 Shortness of breath: Secondary | ICD-10-CM | POA: Diagnosis not present

## 2017-07-10 DIAGNOSIS — I10 Essential (primary) hypertension: Secondary | ICD-10-CM | POA: Diagnosis not present

## 2017-07-10 DIAGNOSIS — Z8673 Personal history of transient ischemic attack (TIA), and cerebral infarction without residual deficits: Secondary | ICD-10-CM | POA: Diagnosis not present

## 2017-07-10 DIAGNOSIS — Z87891 Personal history of nicotine dependence: Secondary | ICD-10-CM | POA: Diagnosis not present

## 2017-07-10 DIAGNOSIS — R0789 Other chest pain: Secondary | ICD-10-CM | POA: Diagnosis not present

## 2017-07-10 DIAGNOSIS — Z7901 Long term (current) use of anticoagulants: Secondary | ICD-10-CM | POA: Diagnosis not present

## 2017-07-10 DIAGNOSIS — Z79899 Other long term (current) drug therapy: Secondary | ICD-10-CM | POA: Diagnosis not present

## 2017-07-10 DIAGNOSIS — R079 Chest pain, unspecified: Secondary | ICD-10-CM | POA: Diagnosis not present

## 2017-07-10 LAB — BASIC METABOLIC PANEL
Anion gap: 9 (ref 5–15)
BUN: 12 mg/dL (ref 6–20)
CO2: 24 mmol/L (ref 22–32)
CREATININE: 0.79 mg/dL (ref 0.61–1.24)
Calcium: 9.1 mg/dL (ref 8.9–10.3)
Chloride: 105 mmol/L (ref 101–111)
GFR calc Af Amer: >60 mL/min (ref >60)
GLUCOSE: 108 mg/dL — AB (ref 65–99)
Potassium: 4.3 mmol/L (ref 3.5–5.1)
Sodium: 138 mmol/L (ref 135–145)

## 2017-07-10 LAB — CBC
HCT: 43.7 % (ref 39.0–52.0)
Hemoglobin: 16 g/dL (ref 13.0–17.0)
MCH: 31.3 pg (ref 26.0–34.0)
MCHC: 36.6 g/dL — AB (ref 30.0–36.0)
MCV: 85.5 fL (ref 78.0–100.0)
PLATELETS: 206 10*3/uL (ref 150–400)
RBC: 5.11 MIL/uL (ref 4.22–5.81)
RDW: 14.6 % (ref 11.5–15.5)
WBC: 7.1 10*3/uL (ref 4.0–10.5)

## 2017-07-10 LAB — TROPONIN I: Troponin I: <0.03 ng/mL (ref <0.03)

## 2017-07-10 MED ORDER — METHOCARBAMOL 500 MG PO TABS: 500.0000 mg | ORAL_TABLET | Freq: Every evening | ORAL | 0 refills | Status: DC | PRN

## 2017-07-10 NOTE — Discharge Instructions (Addendum)
As discussed, your blood work and imaging was reassuring today. You were provided with a prescription for Robaxin which is a muscle relaxant and you may fill it as needed.  Do not drive or operate machinery while taking this medication.  Take at bedtime as needed or every 8 hours as needed.  Follow up with your primary care provider. Return if symptoms worsen or new concerning symptoms in the meantime.

## 2017-07-10 NOTE — ED Notes (Signed)
Pt reports feeling the chest pain in the same exact spot. The pain started last night after eating and comes and goes. The pain does not radiate. Pt reports having SOB/nausea for months with no explanation.

## 2017-07-10 NOTE — ED Provider Notes (Signed)
[facility] EMERGENCY DEPARTMENT Provider Note   CSN: [id number] Arrival date & time: 07/10/17  1537     History   Chief Complaint Chief Complaint  Patient presents with  . Chest Pain    HPI Scott Walter is a 47 y.o. male with past medical history significant for bipolar disorder, anxiety, PTSD, chronic insomnia, paroxysmal A. fib on chronic anticoagulant, peptic ulcer, EtOH abuse, hypertension presenting with sudden onset right anterior lower chest pain described as a sharp stabbing pain as if someone was "planting a nail in his chest" since last night while eating dinner.  The episodes are intermittent and last for less than a minute with intense sharp pain that turns into a pressure lingering discomfort and goes away.  He denies any rash, fever, chills, cough, hemoptysis, history of DVT/PE, lower extremity swelling or pain.  He does report surgery to his left shoulder 3 weeks ago.  Denies any trauma or injury. He also reports shortness of breath and nausea on review of system but states that this has been chronic and ongoing for months.  He has been talking with his primary care provider evaluating him for chronic fatigue and chronic shortness of breath at rest.  He does report that his thyroid functions were normal.  HPI  Past Medical History:  Diagnosis Date  . Anxiety   . Bipolar disorder ([place])    h/o SI  . Chronic insomnia   . Chronic lower back pain   . Depression   . Dyslipidemia   . Essential hypertension   . ETOH abuse   . XHBZJIRC(789.3)    "monthly" (01/26/2013)  . History of stomach ulcers ~ 2009  . Midsternal chest pain    a. 06/2014 Myoview: EF 50%, no ischemia/infarct.  . Migraine    "maybe 2 in my lifetime" (01/26/2013)  . PAF (paroxysmal atrial fibrillation) ([place])    a. Dx 05/2012;  b. 06/2014 Xarelto started in setting of ? TIA (CHA2DS2VASc = 3);  c. 06/2014 Echo: EF 60-65%, no rwma.  [name] PTSD (post-traumatic stress disorder)     Patient Active  Problem List   Diagnosis Date Noted  . OSA (obstructive sleep apnea) 06/10/2017  . Preventative health care 09/06/2014  . Testicular discomfort 09/06/2014  . Low back pain 09/06/2014  . Dyslipidemia   . Chest pain 07/18/2014  . Visual disturbance 07/18/2014  . Paroxysmal atrial fibrillation ([place]) 07/18/2014  . Chest pain with moderate risk for cardiac etiology 07/18/2014  . Amaurosis fugax of right eye 07/18/2014  . TIA (transient ischemic attack)   . Shortness of breath 03/02/2013  . Chronic insomnia 03/02/2013  . Bipolar disorder ([place]) 03/02/2013  . Precordial pain 01/26/2013  . Hyperglycemia 01/26/2013  . Essential hypertension 06/12/2012  . ETOH abuse 06/12/2012    Past Surgical History:  Procedure Laterality Date  . KNEE ARTHROSCOPY Right 09/2011  . SHOULDER SURGERY          Home Medications    Prior to Admission medications   Medication Sig Start Date End Date Taking? Authorizing Provider  albuterol (PROVENTIL HFA;VENTOLIN HFA) 108 (90 Base) MCG/ACT inhaler Inhale 2 puffs into the lungs every 6 (six) hours as needed for wheezing or shortness of breath. 07/21/16   Fay Records, MD  Apple Cider Vinegar 188 MG CAPS Take 1 capsule by mouth.    [provider]  Armodafinil 250 MG tablet Take 1 tablet (250 mg total) by mouth daily. 06/10/17   Sater, Nanine Means, MD  Artificial  Tear Ointment (DRY EYES OP) Apply to eye.    [provider]  atenolol (TENORMIN) 25 MG tablet Take 1 tablet (25 mg total) by mouth daily. 03/23/17   Fay Records, MD  cetirizine (ZYRTEC) 10 MG tablet Take 10 mg by mouth daily.    [provider]  fluticasone (FLONASE) 50 MCG/ACT nasal spray Place 1 spray into both nostrils daily. 04/29/17   Petrucelli, Glynda Jaeger, PA-C  Melatonin 10 MG CAPS Take 3 capsules by mouth at bedtime as needed.    [provider]  methocarbamol (ROBAXIN) 500 MG tablet Take 1 tablet (500 mg total) by mouth at bedtime as needed. 07/10/17   Emeline General, PA-C  omeprazole (PRILOSEC) 20 MG capsule Take 1 capsule (20 mg total) by mouth 2 (two) times daily before a meal. 06/04/17   Fay Records, MD  OVER THE COUNTER MEDICATION Super Foods Apple Cider Vinegar    [provider]  OVER THE COUNTER MEDICATION Swanson Superior Herbs Moringa [place] Supplement    [provider]  sucralfate (CARAFATE) 1 g tablet TAKE 1 TABLET (1 G TOTAL) BY MOUTH 3 (THREE) TIMES DAILY WITH MEALS AS NEEDED. 07/03/17   Fay Records, MD  triamcinolone cream (KENALOG) 0.1 % Apply 1 application topically 2 (two) times daily as needed. 07/28/16   Burchette, Alinda Sierras, MD  XARELTO 20 MG TABS tablet TAKE 1 TABLET BY MOUTH EVERY DAY WITH SUPPER 06/23/17   Fay Records, MD    Family History Family History  Problem Relation Age of Onset  . CAD Mother   . Diabetes Mother   . Emphysema Father   . Healthy Sister   . Healthy Brother   . Healthy Sister   . Healthy Sister   . Healthy Sister   . Healthy Sister   . Healthy Sister   . Healthy Sister   . Healthy Sister   . Healthy Brother   . Healthy Brother   . Healthy Brother   . HIV Brother     Social History Social History   Tobacco Use  . Smoking status: Former Smoker    Packs/day: 0.75    Years: 30.00    Pack years: 22.50    Types: Cigarettes    Last attempt to quit: 03/07/2017    Years since quitting: 0.3  . Smokeless tobacco: Former Systems developer    Types: Chew  . Tobacco comment: 01/26/2013 "quit chewing > 20 yr ago"  Substance Use Topics  . Alcohol use: Yes    Comment: occ  . Drug use: No     Allergies   Ambien [zolpidem tartrate] and Depakote [divalproex sodium]   Review of Systems Review of Systems  Constitutional: Positive for fatigue. Negative for chills, diaphoresis and fever.       Chronic fatigue  HENT: Negative for congestion.   Respiratory: Positive for shortness of breath. Negative for cough, choking, chest tightness, wheezing and stridor.        Reports chronic  shortness of breath at rest for months which has been worked up by his primary care provider.  Cardiovascular: Positive for chest pain. Negative for palpitations and leg swelling.  Gastrointestinal: Positive for nausea. Negative for abdominal distention, abdominal pain and vomiting.       Chronic unexplained nausea for months  Genitourinary: Negative for difficulty urinating and dysuria.  Musculoskeletal: Negative for arthralgias, back pain, gait problem, joint swelling, neck pain and neck stiffness.  Skin: Negative for color change, pallor,  rash and wound.  Neurological: Negative for dizziness, weakness, light-headedness, numbness and headaches.     Physical Exam Updated Vital Signs BP (!) 133/91   Pulse 69   Temp 98.3 F (36.8 C) (Oral)   Resp 13   Ht 5\' 9"  (1.753 m)   Wt 118.4 kg (261 lb)   SpO2 96%   BMI 38.54 kg/m   Physical Exam  Constitutional: He appears well-developed and well-nourished.  Non-toxic appearance. He does not appear ill. No distress.  Well-appearing, nontoxic afebrile sitting comfortably in bed no acute distress.  Patient states that he is asymptomatic on initial assessment.  HENT:  Head: Normocephalic and atraumatic.  Eyes: Conjunctivae are normal.  Neck: Normal range of motion.  Cardiovascular: Normal rate, regular rhythm and normal pulses.  No murmur heard. Pulmonary/Chest: Effort normal and breath sounds normal. No accessory muscle usage or stridor. No respiratory distress. He has no decreased breath sounds. He has no wheezes. He has no rhonchi. He has no rales.  Abdominal: Soft. He exhibits no distension. There is no tenderness. There is no rebound and no guarding.  Negative right upper quadrant tenderness palpation, negative Murphy sign. Abdomen is soft and nontender to palpation.  Musculoskeletal: Normal range of motion. He exhibits no edema.       Right lower leg: Normal. He exhibits no tenderness and no edema.       Left lower leg: Normal. He  exhibits no tenderness and no edema.  Neurological: He is alert. He is not disoriented.  Skin: Skin is warm and dry. No ecchymosis and no rash noted. He is not diaphoretic. No erythema. No pallor.  Psychiatric: He has a normal mood and affect.  Nursing note and vitals reviewed.    ED Treatments / Results  Labs (all labs ordered are listed, but only abnormal results are displayed) Labs Reviewed  BASIC METABOLIC PANEL - Abnormal; Notable for the following components:      Result Value   Glucose, Bld 108 (*)    All other components within normal limits  CBC - Abnormal; Notable for the following components:   MCHC 36.6 (*)    All other components within normal limits  TROPONIN I    EKG EKG Interpretation  Date/Time:  Friday Jul 10 2017 15:46:57 EDT Ventricular Rate:  73 PR Interval:  148 QRS Duration: 90 QT Interval:  382 QTC Calculation: 420 R Axis:   32 Text Interpretation:  Normal sinus rhythm T wave abnormality Artifact Abnormal ekg Confirmed by Carmin Muskrat [phone]) on 07/10/2017 4:21:43 PM   Radiology Dg Chest 2 View  Result Date: 07/10/2017 CLINICAL DATA:  Pain under the right breast since last night. EXAM: CHEST - 2 VIEW COMPARISON:  10/07/2016 FINDINGS: The heart size and mediastinal contours are within normal limits. Both lungs are clear. The visualized skeletal structures are unremarkable. IMPRESSION: No active cardiopulmonary disease. Electronically Signed   By: Kathreen Devoid   On: 07/10/2017 16:00    Procedures Procedures (including critical care time)  Medications Ordered in ED Medications - No data to display   Initial Impression / Assessment and Plan / ED Course  I have reviewed the triage vital signs and the nursing notes.  Pertinent labs & imaging results that were available during my care of the patient were reviewed by me and considered in my medical decision making (see chart for details).    Patient presenting with sudden onset sharp right-sided  chest pain since last night that has been intermittent since and  lasting for a minute before resolving.   Patient is on chronic anticoagulants   EKG is unchanged from prior tracing, negative troponin, pain has been sporadic and intermittent since last night which should have shown a rise in troponin at this time. Low suspicion for ACS as etiology. Heart score: 3  Cannot use PERC due to surgery 3 weeks ago, but low suspicion for PE nonetheless, patient is on chronic anticoagulant, heart rate is 59, satting 100% on room air, no history of DVT/PE, no lower extremity edema or pain.    I do not believe that there is an emergent etiology to patient's symptoms at this time.  He is well-appearing nontoxic afebrile and hemodynamically stable.  Labs are unremarkable.   Chest x-ray negative for pneumonia.  I do not believe that liver function testing is granted at this time given no tenderness to palpation of the right upper quadrant.  I do not suspect that this is the etiology of patient's symptoms.  Patient will be discharged home with symptomatic relief and close follow-up with PCP.   Patient was discussed with Dr. Vanita Panda who has seen patient and agrees with assessment and plan.  Final Clinical Impressions(s) / ED Diagnoses   Final diagnoses:  Nonspecific chest pain    ED Discharge Orders        Ordered    methocarbamol (ROBAXIN) 500 MG tablet  At bedtime PRN     07/10/17 1829       Emeline General, PA-C 07/10/17 1900    Carmin Muskrat, MD 07/11/17 [phone]

## 2017-07-10 NOTE — ED Triage Notes (Signed)
C/o right side CP started last night-NAD-steady gait

## 2017-07-16 ENCOUNTER — Encounter: Payer: Self-pay | Admitting: Family Medicine

## 2017-07-16 DIAGNOSIS — Z0000 Encounter for general adult medical examination without abnormal findings: Secondary | ICD-10-CM | POA: Diagnosis not present

## 2017-07-16 DIAGNOSIS — F329 Major depressive disorder, single episode, unspecified: Secondary | ICD-10-CM | POA: Diagnosis not present

## 2017-07-16 DIAGNOSIS — F419 Anxiety disorder, unspecified: Secondary | ICD-10-CM | POA: Diagnosis not present

## 2017-07-16 DIAGNOSIS — M25561 Pain in right knee: Secondary | ICD-10-CM | POA: Diagnosis not present

## 2017-07-16 DIAGNOSIS — I480 Paroxysmal atrial fibrillation: Secondary | ICD-10-CM | POA: Diagnosis not present

## 2017-07-16 DIAGNOSIS — M25761 Osteophyte, right knee: Secondary | ICD-10-CM | POA: Diagnosis not present

## 2017-07-16 DIAGNOSIS — F4310 Post-traumatic stress disorder, unspecified: Secondary | ICD-10-CM | POA: Diagnosis not present

## 2017-07-16 DIAGNOSIS — Z1321 Encounter for screening for nutritional disorder: Secondary | ICD-10-CM | POA: Diagnosis not present

## 2017-07-28 ENCOUNTER — Other Ambulatory Visit: Payer: Self-pay | Admitting: Internal Medicine

## 2017-07-28 NOTE — Telephone Encounter (Signed)
Outpatient Medication Detail    Disp Refills Start End   omeprazole (PRILOSEC) 20 MG capsule 90 capsule 3 06/04/2017    Sig - Route: Take 1 capsule (20 mg total) by mouth 2 (two) times daily before a meal. - Oral   Sent to pharmacy as: omeprazole (PRILOSEC) 20 MG capsule   E-Prescribing Status: Receipt confirmed by pharmacy (06/04/2017 9:49 AM EDT)   Pharmacy   [id number] - [place], [place] - [address]

## 2017-08-03 DIAGNOSIS — F1010 Alcohol abuse, uncomplicated: Secondary | ICD-10-CM | POA: Diagnosis not present

## 2017-08-05 ENCOUNTER — Other Ambulatory Visit: Payer: Self-pay | Admitting: Internal Medicine

## 2017-08-05 NOTE — Telephone Encounter (Signed)
Pt's pharmacy is requesting a refill on sucralfate. Would Dr. [name] like to refill this medication? Please address

## 2017-08-06 NOTE — Telephone Encounter (Signed)
Routing to Dr. [name] to see if she is going to continue filling carafate or if pt should f/u with PCP for this. Pt not due to return to [facility] until December 2020.

## 2017-08-10 DIAGNOSIS — F1011 Alcohol abuse, in remission: Secondary | ICD-10-CM | POA: Diagnosis not present

## 2017-08-10 DIAGNOSIS — M222X1 Patellofemoral disorders, right knee: Secondary | ICD-10-CM | POA: Diagnosis not present

## 2017-08-13 ENCOUNTER — Telehealth: Payer: Self-pay | Admitting: Internal Medicine

## 2017-08-13 ENCOUNTER — Ambulatory Visit: Payer: Federal, State, Local not specified - PPO | Admitting: Internal Medicine

## 2017-08-13 DIAGNOSIS — Z3009 Encounter for other general counseling and advice on contraception: Secondary | ICD-10-CM | POA: Diagnosis not present

## 2017-08-13 NOTE — Telephone Encounter (Signed)
New Message:          [facility] Pre-operative Risk Assessment    Request for surgical clearance:  1. What type of surgery is being performed? Vasectomy   2. When is this surgery scheduled? 08/28/17  3. What type of clearance is required (medical clearance vs. Pharmacy clearance to hold med vs. Both)? pharmacy  4. Are there any medications that need to be held prior to surgery and how long?XARELTO 20 MG TABS tablet   5. Practice name and name of physician performing surgery? Dr. Saralyn Pilar McKenzie/[facility]  6. What is your office phone number [phone]   7.   What is your office fax number [phone]  8.   Anesthesia type (None, local, MAC, general) ? [place] 08/13/2017, 4:04 PM  _________________________________________________________________   (provider comments below)

## 2017-08-14 ENCOUNTER — Telehealth: Payer: Self-pay | Admitting: Internal Medicine

## 2017-08-14 NOTE — Telephone Encounter (Signed)
New Message   Pt returning call to nurse

## 2017-08-14 NOTE — Telephone Encounter (Signed)
   Per Pharmacist review: Pt takes Xarelto for afib with CHADS2VASc score of 3 (HTN, TIA). Renal function is normal. Recommend only holding Xarelto for 24 hours prior if possible due to history of TIA. Resume therapy ASAP, within 24 hours of procedure ideally.  I will route this to the requesting physician and removed from the preop pool.  I will also route this to the call back pool so that they can make sure the patient is aware when to hold the Xarelto.  Rosaria Ferries, PA-C 08/14/2017 2:26 PM [place] [phone]    Dr. Saralyn Pilar McKenzie/[facility] office phone number [phone]  office fax number [phone]

## 2017-08-14 NOTE — Telephone Encounter (Signed)
Pt takes Xarelto for afib with CHADS2VASc score of 3 (HTN, TIA). Renal function is normal. Recommend only holding Xarelto for 24 hours prior if possible due to history of TIA. Resume therapy ASAP, within 24 hours of procedure ideally.

## 2017-08-14 NOTE — Telephone Encounter (Signed)
Left message for patient to contact office.

## 2017-08-17 NOTE — Telephone Encounter (Signed)
Attempted to reach pt again still no answer but I was able to leave a voicemail for him to call back

## 2017-08-18 NOTE — Telephone Encounter (Signed)
Left message for pt to call back  °

## 2017-08-19 NOTE — Telephone Encounter (Signed)
Spoke with pt's wife per dpr , She stated that she will notify her husband of instructions. She verbalized understanding.

## 2017-08-19 NOTE — Telephone Encounter (Signed)
Attempted to reach pt again, Still no answer, Left message for pt to call back.

## 2017-08-28 DIAGNOSIS — Z302 Encounter for sterilization: Secondary | ICD-10-CM | POA: Diagnosis not present

## 2017-09-03 DIAGNOSIS — G8929 Other chronic pain: Secondary | ICD-10-CM | POA: Diagnosis not present

## 2017-09-03 DIAGNOSIS — M25561 Pain in right knee: Secondary | ICD-10-CM | POA: Diagnosis not present

## 2017-09-17 HISTORY — PX: VASECTOMY: SHX75

## 2017-09-22 DIAGNOSIS — R0683 Snoring: Secondary | ICD-10-CM | POA: Diagnosis not present

## 2017-09-22 DIAGNOSIS — Z915 Personal history of self-harm: Secondary | ICD-10-CM | POA: Diagnosis not present

## 2017-09-22 DIAGNOSIS — M25579 Pain in unspecified ankle and joints of unspecified foot: Secondary | ICD-10-CM | POA: Diagnosis not present

## 2017-09-22 DIAGNOSIS — F4312 Post-traumatic stress disorder, chronic: Secondary | ICD-10-CM | POA: Diagnosis not present

## 2017-10-05 MED ORDER — RIVAROXABAN 20 MG PO TABS: 20.0000 mg | ORAL_TABLET | Freq: Every day | ORAL | 11 refills | Status: DC

## 2017-10-05 NOTE — Telephone Encounter (Signed)
Prescription printed, signed by Dr. [name] to and faxed to [phone] Attn: Dr Elliot Cousin.  The most recent progress note is from April 2019. This has been faxed as well.

## 2017-11-25 DIAGNOSIS — G4733 Obstructive sleep apnea (adult) (pediatric): Secondary | ICD-10-CM | POA: Diagnosis not present

## 2017-12-01 ENCOUNTER — Ambulatory Visit: Payer: Federal, State, Local not specified - PPO | Admitting: Internal Medicine

## 2017-12-29 ENCOUNTER — Other Ambulatory Visit: Payer: Self-pay | Admitting: Internal Medicine

## 2017-12-30 ENCOUNTER — Telehealth: Payer: Self-pay | Admitting: Internal Medicine

## 2017-12-30 NOTE — Telephone Encounter (Signed)
Xarelto 20mg  refill request received; pt is 47 yrs old, wt-119.1kg, Crea-0.79 on 07/10/17, last seen on 06/04/17 by Dr. [name], CrCl-196.33ml/min; will send in refill to requested pharmacy.

## 2017-12-30 NOTE — Telephone Encounter (Signed)
New message   Patient's wife states that need a echocardiogram setup because the patient has gone into afib 3-4 times within the last month. Please advise.

## 2017-12-30 NOTE — Telephone Encounter (Signed)
Per patient's wife pt had afib last night, lasted about an hour.  Gets SOB and dizzy.  No blood pressures or HRs during those times. Heart racing and irregular. More chest pressure than sharp pain during the episodes Happened about 3-4 times in the last month. Pt's wife feels it could be related to stress.  Pt asked her to call to ask for echocardiogram but after discussing this he may have meant EKG. Adv if symptoms are sporadic may be hard to see on EKG. Adv to monitor BP and that if it is 120/ or greater to give him extra one atenolol 25 mg when he feels irregular heart beat. She will do this. Also adv Dr. [name] may rec he wear monitor.  Scheduled with Dr. [name] on 01/12/18.  Adv if there are further recommendations we will call them back.

## 2018-01-12 ENCOUNTER — Encounter: Payer: Self-pay | Admitting: Internal Medicine

## 2018-01-12 ENCOUNTER — Ambulatory Visit (INDEPENDENT_AMBULATORY_CARE_PROVIDER_SITE_OTHER): Payer: Federal, State, Local not specified - PPO | Admitting: Internal Medicine

## 2018-01-12 VITALS — BP 138/80 | HR 79 | Ht 69.0 in | Wt 255.2 lb

## 2018-01-12 DIAGNOSIS — I480 Paroxysmal atrial fibrillation: Secondary | ICD-10-CM

## 2018-01-12 DIAGNOSIS — I10 Essential (primary) hypertension: Secondary | ICD-10-CM | POA: Diagnosis not present

## 2018-01-12 NOTE — Patient Instructions (Signed)
Medication Instructions:  No change today.  If you need a refill on your cardiac medications before your next appointment, please call your pharmacy.   Lab work: Art gallery manager, cbc, tsh  If you have labs (blood work) drawn today and your tests are completely normal, you will receive your results only by: [name] MyChart Message (if you have MyChart) OR . A paper copy in the mail If you have any lab test that is abnormal or we need to change your treatment, we will call you to review the results.  Testing/Procedures: none   Any Other Special Instructions Will Be Listed Below (If Applicable).

## 2018-01-12 NOTE — Progress Notes (Addendum)
Cardiology Office Note   Date:  01/12/2018   ID:  Scott Walter, DOB January 22, 1971, MRN [id number]  PCP:  Scott Post, MD  Cardiologist:   Scott Carnes, MD   F/U of PAF     History of Present Illness: Scott Walter is a 47 y.o. male with a history ofPAF, HTN ? TIA, anxiety, bopolar disorderd  I saw him in June 2018  The pt says that he has had a few episodes of atrial fibrillation over the past month   Last about 1.5 hours   Some dizziness When not having spells feels OK   No CP   No SOB   Current Meds  Medication Sig  . albuterol (PROVENTIL HFA;VENTOLIN HFA) 108 (90 Base) MCG/ACT inhaler Inhale 2 puffs into the lungs every 6 (six) hours as needed for wheezing or shortness of breath.  . Apple Cider Vinegar 188 MG CAPS Take 1 capsule by mouth.  . Armodafinil 250 MG tablet Take 1 tablet (250 mg total) by mouth daily.  . Artificial Tear Ointment (DRY EYES OP) Apply to eye.  [name] atenolol (TENORMIN) 25 MG tablet Take 1 tablet (25 mg total) by mouth daily.  . cetirizine (ZYRTEC) 10 MG tablet Take 10 mg by mouth daily.  . fluticasone (FLONASE) 50 MCG/ACT nasal spray Place 1 spray into both nostrils daily.  . Melatonin 10 MG CAPS Take 3 capsules by mouth at bedtime as needed.  . methocarbamol (ROBAXIN) 500 MG tablet Take 1 tablet (500 mg total) by mouth at bedtime as needed.  [name] omeprazole (PRILOSEC) 20 MG capsule Take 1 capsule (20 mg total) by mouth 2 (two) times daily before a meal.  . OVER THE COUNTER MEDICATION Super Foods Gaffer  . OVER THE COUNTER MEDICATION Sw[employer] Superior Herbs Moringa [place] Supplement  . rivaroxaban (XARELTO) 20 MG TABS tablet Take 1 tablet (20 mg total) by mouth daily with supper.  . sucralfate (CARAFATE) 1 g tablet TAKE 1 TABLET (1 G TOTAL) BY MOUTH 3 (THREE) TIMES DAILY WITH MEALS AS NEEDED.  [name] triamcinolone cream (KENALOG) 0.1 % Apply 1 application topically 2 (two) times daily as needed.  Alveda Reasons 20 MG TABS tablet TAKE 1 TABLET BY MOUTH  EVERY DAY WITH SUPPER     Allergies:   Ambien [zolpidem tartrate] and Depakote [divalproex sodium]   Past Medical History:  Diagnosis Date  . Anxiety   . Bipolar disorder ([place])    h/o SI  . Chronic insomnia   . Chronic lower back pain   . Depression   . Dyslipidemia   . Essential hypertension   . ETOH abuse   . HALPFXTK(240.9)    "monthly" (01/26/2013)  . History of stomach ulcers ~ 2009  . Midsternal chest pain    a. 06/2014 Myoview: EF 50%, no ischemia/infarct.  . Migraine    "maybe 2 in my lifetime" (01/26/2013)  . PAF (paroxysmal atrial fibrillation) ([place])    a. Dx 05/2012;  b. 06/2014 Xarelto started in setting of ? TIA (CHA2DS2VASc = 3);  c. 06/2014 Echo: EF 60-65%, no rwma.  [name] PTSD ([place]-traumatic stress disorder)     Past Surgical History:  Procedure Laterality Date  . KNEE ARTHROSCOPY Right 09/2011  . SHOULDER SURGERY       Social History:  The patient  reports that he quit smoking about 10 months ago. His smoking use included cigarettes. He has a 22.50 pack-year smoking history. He has quit using smokeless tobacco.  His smokeless tobacco use  included chew. He reports that he drinks alcohol. He reports that he does not use drugs.   Family History:  The patient's family history includes CAD in his mother; Diabetes in his mother; Emphysema in his father; HIV in his brother; Healthy in his brother, brother, brother, brother, sister, sister, sister, sister, sister, sister, sister, and sister.    ROS:  Please see the history of present illness. All other systems are reviewed and  Negative to the above problem except as noted.    PHYSICAL EXAM: VS:  BP 138/80   Pulse 79   Ht 5\' 9"  (1.753 m)   Wt 255 lb 3.2 oz (115.8 kg)   SpO2 98%   BMI 37.69 kg/m   GEN: Morbidly obese 47 yo , in no acute distress  HEENT: normal  Neck: JVP is notelevated   No, carotid bruits, or masses Cardiac: RRR; no murmurs, rubs, or gallops,no edema  Respirator   clear to auscultation  bilaterally, normal work of breathing GI: soft, nontender, nondistended, + BS  No hepatomegaly  MS: no deformity Moving all extremities   Skin: warm and dry, no rash Neuro:  Strength and sensation are intact Psych: euthymic mood, full affect   EKG:  EKG is not  ordered today.   Lipid Panel    Component Value Date/Time   CHOL 184 07/28/2016 1546   TRIG 187.0 (H) 07/28/2016 1546   HDL 41.10 07/28/2016 1546   CHOLHDL 4 07/28/2016 1546   VLDL 37.4 07/28/2016 1546   LDLCALC 105 (H) 07/28/2016 1546      Wt Readings from Last 3 Encounters:  01/12/18 255 lb 3.2 oz (115.8 kg)  07/10/17 261 lb (118.4 kg)  06/10/17 262 lb 8 oz (119.1 kg)      ASSESSMENT AND PLAN:  1  PAF Pt with recurrent spells   Will check labs today  COnsider adding Flecanide to control rhythm   Continue Xarelto.  2  Chest discomfort  Denies   He had a normal myovue in 2015     3  HTN  BP is better than previous      4 Tob  Counselled on stopping   5  ? TIA   No definite TIA   WOuld keep on Xarelto      Current medicines are reviewed at length with the patient today.  The patient does not have concerns regarding medicines.  Signed, Scott Carnes, MD  01/12/2018 3:01 PM    [facility] [place], [place], [place]  [address] Phone: [phone]; Fax: [phone]

## 2018-01-13 ENCOUNTER — Telehealth: Payer: Self-pay | Admitting: *Deleted

## 2018-01-13 DIAGNOSIS — I480 Paroxysmal atrial fibrillation: Secondary | ICD-10-CM

## 2018-01-13 LAB — BASIC METABOLIC PANEL
BUN / CREAT RATIO: 12 (ref 9–20)
BUN: 10 mg/dL (ref 6–24)
CO2: 21 mmol/L (ref 20–29)
CREATININE: 0.83 mg/dL (ref 0.76–1.27)
Calcium: 9.5 mg/dL (ref 8.7–10.2)
Chloride: 103 mmol/L (ref 96–106)
GFR calc Af Amer: 121 mL/min/{1.73_m2} (ref >59)
GFR, EST NON AFRICAN AMERICAN: 105 mL/min/{1.73_m2} (ref >59)
Glucose: 81 mg/dL (ref 65–99)
Potassium: 4.9 mmol/L (ref 3.5–5.2)
SODIUM: 142 mmol/L (ref 134–144)

## 2018-01-13 LAB — CBC
HEMATOCRIT: 46.9 % (ref 37.5–51.0)
Hemoglobin: 16 g/dL (ref 13.0–17.7)
MCH: 29.7 pg (ref 26.6–33.0)
MCHC: 34.1 g/dL (ref 31.5–35.7)
MCV: 87 fL (ref 79–97)
PLATELETS: 226 10*3/uL (ref 150–450)
RBC: 5.38 x10E6/uL (ref 4.14–5.80)
RDW: 13.7 % (ref 12.3–15.4)
WBC: 7.8 10*3/uL (ref 3.4–10.8)

## 2018-01-13 LAB — TSH: TSH: 0.606 u[IU]/mL (ref 0.450–4.500)

## 2018-01-13 MED ORDER — FLECAINIDE ACETATE 50 MG PO TABS: 75.0000 mg | ORAL_TABLET | Freq: Two times a day (BID) | ORAL | 6 refills | Status: DC

## 2018-01-13 NOTE — Telephone Encounter (Signed)
-----   Message from Fay Records, MD sent at 01/13/2018  8:51 AM EST ----- CBC and electrolytes / kidney function ar normal With spells of atrial fib I would recomm he start 75 mg Flecanide bid   Needs EKG in 10 days and to be set up for GXT    Continue other meds     Will need f/u to see if controls/ helps decrease afib

## 2018-01-13 NOTE — Telephone Encounter (Signed)
Spoke with patient.  Informed of results and arranged GXT. He will start flecainide 75 mg BID on 11/29 and will come on 12/11 (next available) for GXT and EKG. Aware of GXT instructions. Aware to call if any adverse reactions.

## 2018-01-27 ENCOUNTER — Ambulatory Visit (INDEPENDENT_AMBULATORY_CARE_PROVIDER_SITE_OTHER): Payer: Federal, State, Local not specified - PPO

## 2018-01-27 DIAGNOSIS — I480 Paroxysmal atrial fibrillation: Secondary | ICD-10-CM

## 2018-01-27 LAB — EXERCISE TOLERANCE TEST
CHL CUP MPHR: 173 {beats}/min
CHL CUP RESTING HR STRESS: 61 {beats}/min
CSEPED: 8 min
Estimated workload: 10.1 METS
Exercise duration (sec): 0 s
Peak HR: 141 {beats}/min
Percent HR: 81 %
RPE: 17

## 2018-02-26 ENCOUNTER — Ambulatory Visit: Payer: Federal, State, Local not specified - PPO | Admitting: Internal Medicine

## 2018-02-26 NOTE — Telephone Encounter (Signed)
Patient uploaded document for Dr. [name] to review and in the message stated see you this afternoon. Called patient and advised his appointment for today was cancelled after his normal stress test in Dec Dr. [name] planned to see him back in 2-3 months.  Scheduled ov for 05/31/18. Adv if he has any problems we will schedule him sooner with APP. Pt states occas he gets some chest tightness with activity which resolves with rest.  Most recent episode was this morning.  He attributes this to his anxiety and he voices that he has had things going on in his life lately that are making anxiety worse.   Aware I am forwarding to Dr. [name] to make aware.

## 2018-02-27 ENCOUNTER — Telehealth: Payer: Self-pay | Admitting: Internal Medicine

## 2018-02-27 NOTE — Telephone Encounter (Signed)
I found BP checks under media    Last few listings are a little high I would try adding amlodipine 2.5 mg to regimen  This is a low dose Keep track of BP in a fw weeks   Check a few times per week

## 2018-03-01 MED ORDER — AMLODIPINE BESYLATE 2.5 MG PO TABS: 2.5000 mg | ORAL_TABLET | Freq: Every day | ORAL | 3 refills | Status: DC

## 2018-03-01 NOTE — Addendum Note (Signed)
Addended by: Rodman Key on: 03/01/2018 02:43 PM   Modules accepted: Orders

## 2018-03-01 NOTE — Telephone Encounter (Signed)
Fay Records, MD at 02/27/2018 11:32 PM    I found BP checks under media    Last few listings are a little high I would try adding amlodipine 2.5 mg to regimen  This is a low dose Keep track of BP in a fw weeks   Check a few times per week      Called patient and informed of recommendations.  He will pick up and begin taking and will monitor BP at home a few times a week.  Adv to send list of readings to Dr [name] in a few weeks.

## 2018-03-01 NOTE — Telephone Encounter (Signed)
Called patient and informed of recommendations.  He will pick up and begin taking and will monitor BP at home a few times a week.  Adv to send list of readings to Dr [name] in a few weeks.

## 2018-03-04 DIAGNOSIS — M25562 Pain in left knee: Secondary | ICD-10-CM | POA: Diagnosis not present

## 2018-03-04 DIAGNOSIS — M2241 Chondromalacia patellae, right knee: Secondary | ICD-10-CM | POA: Diagnosis not present

## 2018-03-11 DIAGNOSIS — R200 Anesthesia of skin: Secondary | ICD-10-CM | POA: Diagnosis not present

## 2018-03-11 DIAGNOSIS — I4891 Unspecified atrial fibrillation: Secondary | ICD-10-CM | POA: Diagnosis not present

## 2018-03-11 DIAGNOSIS — R5383 Other fatigue: Secondary | ICD-10-CM | POA: Diagnosis not present

## 2018-03-11 DIAGNOSIS — M25561 Pain in right knee: Secondary | ICD-10-CM | POA: Diagnosis not present

## 2018-03-12 DIAGNOSIS — R5383 Other fatigue: Secondary | ICD-10-CM | POA: Diagnosis not present

## 2018-03-12 DIAGNOSIS — Z125 Encounter for screening for malignant neoplasm of prostate: Secondary | ICD-10-CM | POA: Diagnosis not present

## 2018-03-12 DIAGNOSIS — Z13220 Encounter for screening for lipoid disorders: Secondary | ICD-10-CM | POA: Diagnosis not present

## 2018-03-12 LAB — LIPID PANEL
Cholesterol: 180 (ref 0–200)
Triglycerides: 134 (ref 40–160)

## 2018-03-12 LAB — VITAMIN D 25 HYDROXY (VIT D DEFICIENCY, FRACTURES): Vit D, 25-Hydroxy: 48.95

## 2018-03-12 LAB — CBC AND DIFFERENTIAL
HCT: 46 (ref 41–53)
Hemoglobin: 15.9 (ref 13.5–17.5)
Platelets: 221 (ref 150–399)

## 2018-03-12 LAB — BASIC METABOLIC PANEL
Creatinine: 0.9 (ref 0.6–1.3)
Glucose: 107
Potassium: 4 (ref 3.4–5.3)

## 2018-03-12 LAB — PSA: PSA: 0.77

## 2018-03-12 LAB — TSH: TSH: 0.64 (ref 0.41–5.90)

## 2018-03-15 DIAGNOSIS — M2241 Chondromalacia patellae, right knee: Secondary | ICD-10-CM | POA: Insufficient documentation

## 2018-03-15 DIAGNOSIS — G8918 Other acute postprocedural pain: Secondary | ICD-10-CM | POA: Diagnosis not present

## 2018-03-15 DIAGNOSIS — M94261 Chondromalacia, right knee: Secondary | ICD-10-CM | POA: Diagnosis not present

## 2018-04-19 ENCOUNTER — Ambulatory Visit: Payer: Federal, State, Local not specified - PPO | Admitting: Family Medicine

## 2018-04-19 ENCOUNTER — Encounter: Payer: Self-pay | Admitting: Family Medicine

## 2018-04-19 ENCOUNTER — Other Ambulatory Visit: Payer: Self-pay

## 2018-04-19 VITALS — BP 124/82 | HR 70 | Temp 98.0°F | Ht 69.0 in | Wt 260.1 lb

## 2018-04-19 DIAGNOSIS — M545 Low back pain, unspecified: Secondary | ICD-10-CM

## 2018-04-19 DIAGNOSIS — R319 Hematuria, unspecified: Secondary | ICD-10-CM

## 2018-04-19 LAB — POCT URINALYSIS DIPSTICK
Bilirubin, UA: POSITIVE
Blood, UA: NEGATIVE
Glucose, UA: NEGATIVE
Ketones, UA: POSITIVE
Leukocytes, UA: NEGATIVE
Nitrite, UA: NEGATIVE
Protein, UA: POSITIVE — AB
Spec Grav, UA: 1.015 (ref 1.010–1.025)
Urobilinogen, UA: 1 E.U./dL
pH, UA: 6.5 (ref 5.0–8.0)

## 2018-04-19 LAB — URINALYSIS, MICROSCOPIC ONLY

## 2018-04-19 MED ORDER — METHOCARBAMOL 500 MG PO TABS: 500.0000 mg | ORAL_TABLET | Freq: Three times a day (TID) | ORAL | 0 refills | Status: DC | PRN

## 2018-04-19 NOTE — Progress Notes (Signed)
Subjective:     Patient ID: Scott Walter, male   DOB: May 01, 1970, 48 y.o.   MRN: [id number]  HPI Patient is seen with 1 month history of left flank pain.  He denies any specific injury.  Location is left flank without radiation.  He states his pain is also constant.  Generally about 5 out of 10 in severity.  Worse with movement. No recent fever.  No appetite or weight changes.  He also relates 2 separate episodes of gross blood in his urine.  No burning with urination.  No history of kidney stones.  No recent nausea or vomiting. He is on Xarelto for history of recurrent TIA and atrial fibrillation.  Denies any other bleeding complications.  Patient had episode last Saturday of some left arm pain.  No associated chest pain.  He had exercise tolerance test back in December per cardiology which was unremarkable.  Past Medical History:  Diagnosis Date  . Anxiety   . Bipolar disorder ([place])    h/o SI  . Chronic insomnia   . Chronic lower back pain   . Depression   . Dyslipidemia   . Essential hypertension   . ETOH abuse   . TDDUKGUR(427.0)    "monthly" (01/26/2013)  . History of stomach ulcers ~ 2009  . Midsternal chest pain    a. 06/2014 Myoview: EF 50%, no ischemia/infarct.  . Migraine    "maybe 2 in my lifetime" (01/26/2013)  . PAF (paroxysmal atrial fibrillation) ([place])    a. Dx 05/2012;  b. 06/2014 Xarelto started in setting of ? TIA (CHA2DS2VASc = 3);  c. 06/2014 Echo: EF 60-65%, no rwma.  [name] PTSD (post-traumatic stress disorder)    Past Surgical History:  Procedure Laterality Date  . KNEE ARTHROSCOPY Right 09/2011  . KNEE SURGERY  02/2017   right knee  . SHOULDER SURGERY    . VASECTOMY  09/2017    reports that he quit smoking about 13 months ago. His smoking use included cigarettes. He has a 22.50 pack-year smoking history. He has quit using smokeless tobacco.  His smokeless tobacco use included chew. He reports current alcohol use. He reports that he does not use drugs. family  history includes CAD in his mother; Diabetes in his mother; Emphysema in his father; HIV in his brother; Healthy in his brother, brother, brother, brother, sister, sister, sister, sister, sister, sister, sister, and sister. Allergies  Allergen Reactions  . Ambien [Zolpidem Tartrate] Other (See Comments)    "Nightmares and mood swings  . Divalproex Sodium Anaphylaxis, Shortness Of Breath and Swelling  . Valproic Acid Anaphylaxis and Swelling  . Zolpidem Anaphylaxis     Review of Systems  Constitutional: Negative for appetite change, chills, fever and unexpected weight change.  Respiratory: Negative for cough.   Cardiovascular: Negative for chest pain.  Gastrointestinal: Negative for abdominal pain, nausea and vomiting.  Genitourinary: Positive for flank pain and hematuria. Negative for difficulty urinating, dysuria and testicular pain.       Objective:   Physical Exam Constitutional:      Appearance: Normal appearance.  Cardiovascular:     Rate and Rhythm: Normal rate.     Pulses: Normal pulses.     Heart sounds: Normal heart sounds.  Musculoskeletal:     Right lower leg: No edema.     Left lower leg: No edema.  Neurological:     Mental Status: He is alert.     Comments: Symmetric lower extremity reflexes.  No focal weakness  Assessment:     #1 left lower back pain.  Suspect musculoskeletal given the fact this is worse with movement.  #2 reported 2 episodes of gross hematuria.  Patient on Xarelto    Plan:     -Urine dipstick shows ketones and some protein but no blood on no leukocytes -Recommend trial of Robaxin 500 mg every 8 hours as needed for muscle spasm -Also suggested he try some moist heat and over-the-counter sports cream -Consider physical therapy referral if not improving with the above over the next few days -Follow-up immediately for any recurrent hematuria or other concerns.  His urine dipstick as above was unremarkable for any blood.  Will send  for urine microscopy  Eulas Post MD [facility]

## 2018-04-19 NOTE — Patient Instructions (Signed)
Acute Back Pain, Adult  Acute back pain is sudden and usually short-lived. It is often caused by an injury to the muscles and tissues in the back. The injury may result from:   A muscle or ligament getting overstretched or torn (strained). Ligaments are tissues that connect bones to each other. Lifting something improperly can cause a back strain.   Wear and tear (degeneration) of the spinal disks. Spinal disks are circular tissue that provides cushioning between the bones of the spine (vertebrae).   Twisting motions, such as while playing sports or doing yard work.   A hit to the back.   Arthritis.  You may have a physical exam, lab tests, and imaging tests to find the cause of your pain. Acute back pain usually goes away with rest and home care.  Follow these instructions at home:  Managing pain, stiffness, and swelling   Take over-the-counter and prescription medicines only as told by your health care provider.   Your health care provider may recommend applying ice during the first 24-48 hours after your pain starts. To do this:  ? Put ice in a plastic bag.  ? Place a towel between your skin and the bag.  ? Leave the ice on for 20 minutes, 2-3 times a day.   If directed, apply heat to the affected area as often as told by your health care provider. Use the heat source that your health care provider recommends, such as a moist heat pack or a heating pad.  ? Place a towel between your skin and the heat source.  ? Leave the heat on for 20-30 minutes.  ? Remove the heat if your skin turns bright red. This is especially important if you are unable to feel pain, heat, or cold. You have a greater risk of getting burned.  Activity     Do not stay in bed. Staying in bed for more than 1-2 days can delay your recovery.   Sit up and stand up straight. Avoid leaning forward when you sit, or hunching over when you stand.  ? If you work at a desk, sit close to it so you do not need to lean over. Keep your chin tucked  in. Keep your neck drawn back, and keep your elbows bent at a right angle. Your arms should look like the letter "L."  ? Sit high and close to the steering wheel when you drive. Add lower back (lumbar) support to your car seat, if needed.   Take short walks on even surfaces as soon as you are able. Try to increase the length of time you walk each day.   Do not sit, drive, or stand in one place for more than 30 minutes at a time. Sitting or standing for long periods of time can put stress on your back.   Do not drive or use heavy machinery while taking prescription pain medicine.   Use proper lifting techniques. When you bend and lift, use positions that put less stress on your back:  ? Bend your knees.  ? Keep the load close to your body.  ? Avoid twisting.   Exercise regularly as told by your health care provider. Exercising helps your back heal faster and helps prevent back injuries by keeping muscles strong and flexible.   Work with a physical therapist to make a safe exercise program, as recommended by your health care provider. Do any exercises as told by your physical therapist.  Lifestyle   Maintain   a healthy weight. Extra weight puts stress on your back and makes it difficult to have good posture.   Avoid activities or situations that make you feel anxious or stressed. Stress and anxiety increase muscle tension and can make back pain worse. Learn ways to manage anxiety and stress, such as through exercise.  General instructions   Sleep on a firm mattress in a comfortable position. Try lying on your side with your knees slightly bent. If you lie on your back, put a pillow under your knees.   Follow your treatment plan as told by your health care provider. This may include:  ? Cognitive or behavioral therapy.  ? Acupuncture or massage therapy.  ? Meditation or yoga.  Contact a health care provider if:   You have pain that is not relieved with rest or medicine.   You have increasing pain going down  into your legs or buttocks.   Your pain does not improve after 2 weeks.   You have pain at night.   You lose weight without trying.   You have a fever or chills.  Get help right away if:   You develop new bowel or bladder control problems.   You have unusual weakness or numbness in your arms or legs.   You develop nausea or vomiting.   You develop abdominal pain.   You feel faint.  Summary   Acute back pain is sudden and usually short-lived.   Use proper lifting techniques. When you bend and lift, use positions that put less stress on your back.   Take over-the-counter and prescription medicines and apply heat or ice as directed by your health care provider.  This information is not intended to replace advice given to you by your health care provider. Make sure you discuss any questions you have with your health care provider.  Document Released: 02/03/2005 Document Revised: 09/10/2017 Document Reviewed: 09/17/2016  Elsevier Interactive Patient Education  2019 [employer]vier Inc[employer]

## 2018-05-07 DIAGNOSIS — E291 Testicular hypofunction: Secondary | ICD-10-CM | POA: Diagnosis not present

## 2018-05-17 ENCOUNTER — Telehealth: Payer: Federal, State, Local not specified - PPO | Admitting: Internal Medicine

## 2018-05-17 ENCOUNTER — Telehealth: Payer: Self-pay | Admitting: *Deleted

## 2018-05-17 ENCOUNTER — Other Ambulatory Visit: Payer: Self-pay

## 2018-05-17 NOTE — Telephone Encounter (Signed)
Left message for patient to call back. Will discuss converting upcoming appt with PR to virtual visit.

## 2018-05-17 NOTE — Telephone Encounter (Signed)
F/U Message           Patient is returning your call, patient would like call back

## 2018-05-17 NOTE — Telephone Encounter (Signed)
TELEPHONE CALL NOTE  Renato Spellman has been deemed a candidate for a follow-up tele-health visit to limit community exposure during the Covid-19 pandemic. I spoke with the patient via phone to ensure availability of phone/video source, confirm preferred email & phone number, and discuss instructions and expectations.  I reminded Jermane Brayboy to be prepared with any vital sign and/or heart rhythm information that could potentially be obtained via home monitoring, at the time of his visit. I reminded Blessed Cotham to expect a phone call at the time of his visit if his visit.  Did the patient verbally acknowledge consent to treatment? YES/mw  Rodman Key, RN 05/17/2018 2:18 PM   DOWNLOADING THE [place], go to App Store and type in WebEx in the search bar. [place] [employer], the blue/green circle. The app is free but as with any other app downloads, their phone may require them to verify saved payment information or Apple password. The patient does NOT have to create an account.  - If Android, ask patient to go to [employer] and type in WebEx in the search bar. [place] [employer], the blue/green circle. The app is free but as with any other app downloads, their phone may require them to verify saved payment information or Android password. The patient does NOT have to create an account.   CONSENT FOR TELE-HEALTH VISIT - PLEASE REVIEW  I hereby voluntarily request, consent and authorize [facility] and its employed or contracted physicians, physician assistants, nurse practitioners or other licensed health care professionals (the Practitioner), to provide me with telemedicine health care services (the "Services") as deemed necessary by the treating Practitioner. I acknowledge and consent to receive the Services by the Practitioner via telemedicine. I understand that the telemedicine visit will involve communicating with the  Practitioner through live audiovisual communication technology and the disclosure of certain medical information by electronic transmission. I acknowledge that I have been given the opportunity to request an in-person assessment or other available alternative prior to the telemedicine visit and am voluntarily participating in the telemedicine visit.  I understand that I have the right to withhold or withdraw my consent to the use of telemedicine in the course of my care at any time, without affecting my right to future care or treatment, and that the Practitioner or I may terminate the telemedicine visit at any time. I understand that I have the right to inspect all information obtained and/or recorded in the course of the telemedicine visit and may receive copies of available information for a reasonable fee.  I understand that some of the potential risks of receiving the Services via telemedicine include:  [name] Delay or interruption in medical evaluation due to technological equipment failure or disruption; . Information transmitted may not be sufficient (e.g. poor resolution of images) to allow for appropriate medical decision making by the Practitioner; and/or  . In rare instances, security protocols could fail, causing a breach of personal health information.  Furthermore, I acknowledge that it is my responsibility to provide information about my medical history, conditions and care that is complete and accurate to the best of my ability. I acknowledge that Practitioner's advice, recommendations, and/or decision may be based on factors not within their control, such as incomplete or inaccurate data provided by me or distortions of diagnostic images or specimens that may result from electronic transmissions. I understand that the practice of medicine is not an exact science and  that Practitioner makes no warranties or guarantees regarding treatment outcomes. I acknowledge that I will receive a copy of this  consent concurrently upon execution via email to the email address I last provided but may also request a printed copy by calling the office of [place].    I understand that my insurance will be billed for this visit.   I have read or had this consent read to me. . I understand the contents of this consent, which adequately explains the benefits and risks of the Services being provided via telemedicine.  . I have been provided ample opportunity to ask questions regarding this consent and the Services and have had my questions answered to my satisfaction. . I give my informed consent for the services to be provided through the use of telemedicine in my medical care  By participating in this telemedicine visit I agree to the above.

## 2018-05-17 NOTE — Telephone Encounter (Signed)
Pt has cancelled virtual appointment for today.  He was not going to be available at the time of the visit.  He is aware we will call back to reschedule this.

## 2018-05-19 ENCOUNTER — Telehealth: Payer: Self-pay

## 2018-05-19 NOTE — Telephone Encounter (Signed)
Called pt to set up e-visit in place of ov for 05-31-18 for Dr. [name]. Pt agreed to have PHONE visit. Expecting phone call from nurse to schedule.      Virtual Visit Pre-Appointment Phone Call  Steps For Call:  1. Confirm consent - "In the setting of the current Covid19 crisis, you are scheduled for a (phone or video) visit with your provider on (date) at (time).  Just as we do with many in-office visits, in order for you to participate in this visit, we must obtain consent.  If you'd like, I can send this to your mychart (if signed up) or email for you to review.  Otherwise, I can obtain your verbal consent now.  All virtual visits are billed to your insurance company just like a normal visit would be.  By agreeing to a virtual visit, we'd like you to understand that the technology does not allow for your provider to perform an examination, and thus may limit your provider's ability to fully assess your condition.  Finally, though the technology is pretty good, we cannot assure that it will always work on either your or our end, and in the setting of a video visit, we may have to convert it to a phone-only visit.  In either situation, we cannot ensure that we have a secure connection.  Are you willing to proceed?"  2. Give patient instructions for WebEx download to smartphone as below if video visit  3. Advise patient to be prepared with any vital sign or heart rhythm information, their current medicines, and a piece of paper and pen handy for any instructions they may receive the day of their visit  4. Inform patient they will receive a phone call 15 minutes prior to their appointment time (may be from unknown caller ID) so they should be prepared to answer  5. Confirm that appointment type is correct in Epic appointment notes (video vs telephone)    TELEPHONE CALL NOTE  Scott Walter has been deemed a candidate for a follow-up tele-health visit to limit community exposure during the Covid-19  pandemic. I spoke with the patient via phone to ensure availability of phone/video source, confirm preferred email & phone number, and discuss instructions and expectations.  I reminded Scott Walter to be prepared with any vital sign and/or heart rhythm information that could potentially be obtained via home monitoring, at the time of his visit. I reminded Scott Walter to expect a phone call at the time of his visit if his visit.  Did the patient verbally acknowledge consent to treatment? [place] 05/19/2018 3:05 PM   DOWNLOADING THE [place]  - If Apple, go to [employer] and type in WebEx in the search bar. [place] [employer], the blue/green circle. The app is free but as with any other app downloads, their phone may require them to verify saved payment information or Apple password. The patient does NOT have to create an account.  - If Android, ask patient to go to [employer] and type in WebEx in the search bar. [place] [employer], the blue/green circle. The app is free but as with any other app downloads, their phone may require them to verify saved payment information or Android password. The patient does NOT have to create an account.   CONSENT FOR TELE-HEALTH VISIT - PLEASE REVIEW  I hereby voluntarily request, consent and authorize [facility] and its employed or contracted physicians, physician  assistants, nurse practitioners or other licensed health care professionals (the Practitioner), to provide me with telemedicine health care services (the "Services") as deemed necessary by the treating Practitioner. I acknowledge and consent to receive the Services by the Practitioner via telemedicine. I understand that the telemedicine visit will involve communicating with the Practitioner through live audiovisual communication technology and the disclosure of certain medical information by electronic transmission. I acknowledge  that I have been given the opportunity to request an in-person assessment or other available alternative prior to the telemedicine visit and am voluntarily participating in the telemedicine visit.  I understand that I have the right to withhold or withdraw my consent to the use of telemedicine in the course of my care at any time, without affecting my right to future care or treatment, and that the Practitioner or I may terminate the telemedicine visit at any time. I understand that I have the right to inspect all information obtained and/or recorded in the course of the telemedicine visit and may receive copies of available information for a reasonable fee.  I understand that some of the potential risks of receiving the Services via telemedicine include:  [name] Delay or interruption in medical evaluation due to technological equipment failure or disruption; . Information transmitted may not be sufficient (e.g. poor resolution of images) to allow for appropriate medical decision making by the Practitioner; and/or  . In rare instances, security protocols could fail, causing a breach of personal health information.  Furthermore, I acknowledge that it is my responsibility to provide information about my medical history, conditions and care that is complete and accurate to the best of my ability. I acknowledge that Practitioner's advice, recommendations, and/or decision may be based on factors not within their control, such as incomplete or inaccurate data provided by me or distortions of diagnostic images or specimens that may result from electronic transmissions. I understand that the practice of medicine is not an exact science and that Practitioner makes no warranties or guarantees regarding treatment outcomes. I acknowledge that I will receive a copy of this consent concurrently upon execution via email to the email address I last provided but may also request a printed copy by calling the office of [place].    I understand that my insurance will be billed for this visit.   I have read or had this consent read to me. . I understand the contents of this consent, which adequately explains the benefits and risks of the Services being provided via telemedicine.  . I have been provided ample opportunity to ask questions regarding this consent and the Services and have had my questions answered to my satisfaction. . I give my informed consent for the services to be provided through the use of telemedicine in my medical care  By participating in this telemedicine visit I agree to the above.

## 2018-05-21 ENCOUNTER — Other Ambulatory Visit: Payer: Self-pay

## 2018-05-21 ENCOUNTER — Ambulatory Visit (INDEPENDENT_AMBULATORY_CARE_PROVIDER_SITE_OTHER): Payer: Federal, State, Local not specified - PPO | Admitting: Family Medicine

## 2018-05-21 DIAGNOSIS — H9201 Otalgia, right ear: Secondary | ICD-10-CM

## 2018-05-21 MED ORDER — NEOMYCIN-POLYMYXIN-HC 3.5-10000-1 OT SUSP: 4.0000 [drp] | Freq: Four times a day (QID) | OTIC | 0 refills | Status: DC

## 2018-05-21 NOTE — Progress Notes (Signed)
Patient ID: Scott Walter, male   DOB: 21-Dec-1970, 48 y.o.   MRN: [id number]  Virtual Visit via Video Note  I connected with Scott Walter on 05/21/18 at  2:30 PM EDT by a video enabled telemedicine application and verified that I am speaking with the correct person using two identifiers.  Location patient: home Location provider:work or home office Persons participating in the virtual visit: patient, provider  I discussed the limitations of evaluation and management by telemedicine and the availability of in person appointments. The patient expressed understanding and agreed to proceed.   HPI: Patient relates few day history of left ear pain.  Denies any sore throat.  No headache. No rash.  No drainage other than some clear liquid type drainage.  He states he had temperature up around 99.8.  No chills.  No cough.  No dyspnea.  No TMJ pain.  No recent swimming. He notices that the ear seems to hurt more with movement.  No alleviating factors.  Symptoms are mild to moderate   ROS: See pertinent positives and negatives per HPI.  Past Medical History:  Diagnosis Date  . Anxiety   . Bipolar disorder ([place])    h/o SI  . Chronic insomnia   . Chronic lower back pain   . Depression   . Dyslipidemia   . Essential hypertension   . ETOH abuse   . JJHERDEY(814.4)    "monthly" (01/26/2013)  . History of stomach ulcers ~ 2009  . Midsternal chest pain    a. 06/2014 Myoview: EF 50%, no ischemia/infarct.  . Migraine    "maybe 2 in my lifetime" (01/26/2013)  . PAF (paroxysmal atrial fibrillation) ([place])    a. Dx 05/2012;  b. 06/2014 Xarelto started in setting of ? TIA (CHA2DS2VASc = 3);  c. 06/2014 Echo: EF 60-65%, no rwma.  [name] PTSD (post-traumatic stress disorder)     Past Surgical History:  Procedure Laterality Date  . KNEE ARTHROSCOPY Right 09/2011  . KNEE SURGERY  02/2017   right knee  . SHOULDER SURGERY    . VASECTOMY  09/2017    Family History  Problem Relation Age of Onset  . CAD Mother    . Diabetes Mother   . Emphysema Father   . Healthy Sister   . Healthy Brother   . Healthy Sister   . Healthy Sister   . Healthy Sister   . Healthy Sister   . Healthy Sister   . Healthy Sister   . Healthy Sister   . Healthy Brother   . Healthy Brother   . Healthy Brother   . HIV Brother     SOCIAL HX: Patient quit smoking January 2019   Current Outpatient Medications:  .  albuterol (PROVENTIL HFA;VENTOLIN HFA) 108 (90 Base) MCG/ACT inhaler, Inhale 2 puffs into the lungs every 6 (six) hours as needed for wheezing or shortness of breath., Disp: 1 Inhaler, Rfl: 2 .  amLODipine (NORVASC) 2.5 MG tablet, Take 1 tablet (2.5 mg total) by mouth daily., Disp: 90 tablet, Rfl: 3 .  Artificial Tear Ointment (DRY EYES OP), Apply to eye., Disp: , Rfl:  .  atenolol (TENORMIN) 25 MG tablet, Take 1 tablet (25 mg total) by mouth daily., Disp: 90 tablet, Rfl: 3 .  cetirizine (ZYRTEC) 10 MG tablet, Take 10 mg by mouth daily., Disp: , Rfl:  .  Coenzyme Q10 (COQ10 GUMMIES ADULT PO), Take 200 mg by mouth 2 (two) times daily., Disp: , Rfl:  .  ergocalciferol (VITAMIN D2) 1.25  MG (50000 UT) capsule, , Disp: , Rfl:  .  flecainide (TAMBOCOR) 50 MG tablet, Take 1.5 tablets (75 mg total) by mouth 2 (two) times daily., Disp: 90 tablet, Rfl: 6 .  fluticasone (FLONASE) 50 MCG/ACT nasal spray, Place 1 spray into both nostrils daily., Disp: 16 g, Rfl: 2 .  Melatonin 10 MG CAPS, Take 3 capsules by mouth at bedtime as needed., Disp: , Rfl:  .  methocarbamol (ROBAXIN) 500 MG tablet, Take 1 tablet (500 mg total) by mouth every 8 (eight) hours as needed., Disp: 30 tablet, Rfl: 0 .  omeprazole (PRILOSEC) 20 MG capsule, Take 1 capsule (20 mg total) by mouth 2 (two) times daily before a meal., Disp: 90 capsule, Rfl: 3 .  OVER THE COUNTER MEDICATION, Super Foods Apple Cider Vinegar, Disp: , Rfl:  .  triamcinolone cream (KENALOG) 0.1 %, Apply 1 application topically 2 (two) times daily as needed., Disp: 80 g, Rfl: 2 .   XARELTO 20 MG TABS tablet, TAKE 1 TABLET BY MOUTH EVERY DAY WITH SUPPER, Disp: 90 tablet, Rfl: 1  EXAM:  VITALS per patient if applicable:  GENERAL: alert, oriented, appears well and in no acute distress  HEENT: atraumatic, conjunttiva clear, no obvious abnormalities on inspection of external nose and ears  NECK: normal movements of the head and neck  LUNGS: on inspection no signs of respiratory distress, breathing rate appears normal, no obvious gross SOB, gasping or wheezing  CV: no obvious cyanosis  MS: moves all visible extremities without noticeable abnormality  PSYCH/NEURO: pleasant and cooperative, no obvious depression or anxiety, speech and thought processing grossly intact  ASSESSMENT AND PLAN:  Discussed the following assessment and plan:  Left otalgia.  Obviously, we are limited in not being able to perform exam given current conditions.  Based on the fact that he has pain with movement of the ear canal sounds more like may be otitis externa. -We recommend trial of Cortisporin otic solution to use 4 times daily -Keep water out of ears much as possible -Touch base by next week if not improving     I discussed the assessment and treatment plan with the patient. The patient was provided an opportunity to ask questions and all were answered. The patient agreed with the plan and demonstrated an understanding of the instructions.   The patient was advised to call back or seek an in-person evaluation if the symptoms worsen or if the condition fails to improve as anticipated.  Carolann Littler, MD

## 2018-05-23 ENCOUNTER — Encounter: Payer: Self-pay | Admitting: Family Medicine

## 2018-05-25 ENCOUNTER — Telehealth: Payer: Self-pay

## 2018-05-25 ENCOUNTER — Other Ambulatory Visit: Payer: Self-pay

## 2018-05-25 MED ORDER — AMOXICILLIN-POT CLAVULANATE 875-125 MG PO TABS: 1.0000 | ORAL_TABLET | Freq: Two times a day (BID) | ORAL | 0 refills | Status: AC

## 2018-05-25 NOTE — Telephone Encounter (Signed)
Pt has already been contacted about his VIDEO visit on 4/13 at 8am with Dr. [name]  I was calling pt to confirm instructions. Left pt a message to call back if he has questions. Also sent pt MyChart message.

## 2018-05-27 ENCOUNTER — Encounter: Payer: Self-pay | Admitting: Family Medicine

## 2018-05-31 ENCOUNTER — Encounter: Payer: Self-pay | Admitting: Internal Medicine

## 2018-05-31 ENCOUNTER — Other Ambulatory Visit: Payer: Self-pay

## 2018-05-31 ENCOUNTER — Telehealth (INDEPENDENT_AMBULATORY_CARE_PROVIDER_SITE_OTHER): Payer: Federal, State, Local not specified - PPO | Admitting: Internal Medicine

## 2018-05-31 VITALS — BP 130/81 | HR 73 | Ht 69.0 in | Wt 265.0 lb

## 2018-05-31 DIAGNOSIS — I10 Essential (primary) hypertension: Secondary | ICD-10-CM | POA: Diagnosis not present

## 2018-05-31 DIAGNOSIS — I480 Paroxysmal atrial fibrillation: Secondary | ICD-10-CM

## 2018-05-31 NOTE — Patient Instructions (Signed)
Medication Instructions:  No changes - please send a copy of medication list from [place] to Dr. [name] via [place].  If unable, please provide telephone number and we can call them to request it. If you need a refill on your cardiac medications before your next appointment, please call your pharmacy.   Lab work: none If you have labs (blood work) drawn today and your tests are completely normal, you will receive your results only by: [name] MyChart Message (if you have MyChart) OR . A paper copy in the mail If you have any lab test that is abnormal or we need to change your treatment, we will call you to review the results.  Testing/Procedures: none  Follow-Up: At [facility], you and your health needs are our priority.  As part of our continuing mission to provide you with exceptional heart care, we have created designated Provider Care Teams.  These Care Teams include your primary Cardiologist (physician) and Advanced Practice Providers (APPs -  Physician Assistants and Nurse Practitioners) who all work together to provide you with the care you need, when you need it. Dr. [name] will plan to see you again in about 6 months.  Plan to contact the office for an appointment in July or August.  Any Other Special Instructions Will Be Listed Below (If Applicable).

## 2018-05-31 NOTE — Progress Notes (Signed)
Virtual Visit via Video Note   This visit type was conducted due to national recommendations for restrictions regarding the COVID-19 Pandemic (e.g. social distancing) in an effort to limit this patient's exposure and mitigate transmission in our community.  Due to his co-morbid illnesses, this patient is at least at moderate risk for complications without adequate follow up.  This format is felt to be most appropriate for this patient at this time.  All issues noted in this document were discussed and addressed.  A limited physical exam was performed with this format.  Please refer to the patient's chart for his consent to telehealth for [facility].   Evaluation Performed:  Follow-up visit   Date:  05/31/2018   ID:  Scott Walter, DOB 1970-08-04, MRN [id number]  Patient Location: Home   Provider Location: Home  PCP:  Scott Post, MD  Cardiologist:  [name]  Chief Complaint: F/U of atrial fibrillatoin   History of Present Illness:    Scott Walter is a 48 y.o. male who presents via audio/video conferencing for a telehealth visit today.    Pt has a history of PAF, HTN, ? TIA, bipolar disorder    I saw him in clinic in November 2019     After that visit he c ontinued to have palpitations    Started on Flecanide 75 bid    Treadmill in December he exercised for 8 min   No arrhythmias   Stopped due to knee pain Early this winter BP remained up   He started amlodipine 2.5 mg       Kept on atenolol  The pt says he is doing good   Staying at home due to [place] virus Denies palpitations   No CP   No dizziness NO ankle swelling  BP at home in 130 / range Has some fatigue    Attrib to low testosterone   Just started injections at the [place]   Will go back there for reevaluation  Gets meds through the [place]    The patient does not have symptoms concerning for COVID-19 infection (fever, chills, cough, or new shortness of breath).    Past Medical History:  Diagnosis Date  . Anxiety   .  Bipolar disorder ([place])    h/o SI  . Chronic insomnia   . Chronic lower back pain   . Depression   . Dyslipidemia   . Essential hypertension   . ETOH abuse   . BZJIRCVE(938.1)    "monthly" (01/26/2013)  . History of stomach ulcers ~ 2009  . Midsternal chest pain    a. 06/2014 Myoview: EF 50%, no ischemia/infarct.  . Migraine    "maybe 2 in my lifetime" (01/26/2013)  . PAF (paroxysmal atrial fibrillation) ([place])    a. Dx 05/2012;  b. 06/2014 Xarelto started in setting of ? TIA (CHA2DS2VASc = 3);  c. 06/2014 Echo: EF 60-65%, no rwma.  [name] PTSD (Walter-traumatic stress disorder)    Past Surgical History:  Procedure Laterality Date  . KNEE ARTHROSCOPY Right 09/2011  . KNEE SURGERY  02/2017   right knee  . SHOULDER SURGERY    . VASECTOMY  09/2017     Current Meds  Medication Sig  . albuterol (PROVENTIL HFA;VENTOLIN HFA) 108 (90 Base) MCG/ACT inhaler Inhale 2 puffs into the lungs every 6 (six) hours as needed for wheezing or shortness of breath.  [name] amLODipine (NORVASC) 2.5 MG tablet Take 1 tablet (2.5 mg total) by mouth daily.  [name] amoxicillin-clavulanate (AUGMENTIN) 875-125  MG tablet Take 1 tablet by mouth 2 (two) times daily for 10 days.  . Artificial Tear Ointment (DRY EYES OP) Apply to eye.  [name] atenolol (TENORMIN) 25 MG tablet Take 1 tablet (25 mg total) by mouth daily.  . cetirizine (ZYRTEC) 10 MG tablet Take 10 mg by mouth daily.  . Coenzyme Q10 (COQ10 GUMMIES ADULT PO) Take 200 mg by mouth 2 (two) times daily.  . ergocalciferol (VITAMIN D2) 1.25 MG (50000 UT) capsule Take 50,000 Units by mouth once a week.   . flecainide (TAMBOCOR) 50 MG tablet Take 1.5 tablets (75 mg total) by mouth 2 (two) times daily.  . fluticasone (FLONASE) 50 MCG/ACT nasal spray Place 1 spray into both nostrils daily. (Patient taking differently: Place 1 spray into both nostrils as needed for allergies. )  . Melatonin 10 MG CAPS Take 3 capsules by mouth at bedtime as needed.  [name] omeprazole (PRILOSEC) 20 MG capsule  Take 1 capsule (20 mg total) by mouth 2 (two) times daily before a meal.  . OVER THE COUNTER MEDICATION [employer] Gaffer  . triamcinolone cream (KENALOG) 0.1 % Apply 1 application topically 2 (two) times daily as needed.  Alveda Reasons 20 MG TABS tablet TAKE 1 TABLET BY MOUTH EVERY DAY WITH SUPPER     Allergies:   Ambien [zolpidem tartrate]; Divalproex sodium; Valproic acid; and Zolpidem   Social History   Tobacco Use  . Smoking status: Former Smoker    Packs/day: 0.75    Years: 30.00    Pack years: 22.50    Types: Cigarettes    Last attempt to quit: 03/07/2017    Years since quitting: 1.2  . Smokeless tobacco: Former Systems developer    Types: Chew  . Tobacco comment: 01/26/2013 "quit chewing > 20 yr ago"  Substance Use Topics  . Alcohol use: Yes    Comment: occ  . Drug use: No     Family Hx: The patient's family history includes CAD in his mother; Diabetes in his mother; Emphysema in his father; HIV in his brother; Healthy in his brother, brother, brother, brother, sister, sister, sister, sister, sister, sister, sister, and sister.  ROS:   Please see the history of present illness.    All other systems reviewed and are negative.   Prior CV studies:   The following studies were reviewed today: [facility]XT 02-17-23 Echo 6/16  Labs/Other Tests and Data Reviewed:    EKG:  Not done   Recent Labs: 01/12/2018: BUN 10; Sodium 142 03/12/2018: Creatinine 0.9; Hemoglobin 15.9; Platelets 221; Potassium 4.0; TSH 0.64   Recent Lipid Panel Lab Results  Component Value Date/Time   CHOL 180 03/12/2018   TRIG 134 03/12/2018   HDL 41.10 07/28/2016 03:46 PM   CHOLHDL 4 07/28/2016 03:46 PM   LDLCALC 105 (H) 07/28/2016 03:46 PM    Wt Readings from Last 3 Encounters:  05/31/18 265 lb (120.2 kg)  04/19/18 260 lb 1.6 oz (118 kg)  01/12/18 255 lb 3.2 oz (115.8 kg)     Objective:    Vital Signs:  BP 130/81   Pulse 73   Ht 5\' 9"  (1.753 m)   Wt 265 lb (120.2 kg)   BMI 39.13 kg/m     Well nourished, well developed male in noacute distress. Otherwsise deferred due to phone visit   ASSESSMENT & PLAN:    1. PAF   Continue on flecanide, Xarelto and atenolol  Denies palpitatons    Will review labs from [place]  2.  HTN   BP is better   Continue   3  COVID-19 Education: The signs and symptoms of COVID-19 were discussed with the patient and how to seek care for testing (follow up with PCP or arrange E-visit).  The importance of social distancing was discussed today.  Time:   Today, I have spent  20 minutes with the patient with telehealth technology discussing the above problems.     Medication Adjustments/Labs and Tests Ordered: Current medicines are reviewed at length with the patient today.  Concerns regarding medicines are outlined above.  Tests Ordered: No orders of the defined types were placed in this encounter.  Medication Changes: No orders of the defined types were placed in this encounter.   Disposition:  Follow up in October/November 2020  Signed, Dorris Carnes, MD  05/31/2018 8:02 AM    [place]

## 2018-06-18 DIAGNOSIS — M7651 Patellar tendinitis, right knee: Secondary | ICD-10-CM | POA: Diagnosis not present

## 2018-06-18 DIAGNOSIS — M2241 Chondromalacia patellae, right knee: Secondary | ICD-10-CM | POA: Diagnosis not present

## 2018-07-08 DIAGNOSIS — M25561 Pain in right knee: Secondary | ICD-10-CM | POA: Diagnosis not present

## 2018-07-08 DIAGNOSIS — F4310 Post-traumatic stress disorder, unspecified: Secondary | ICD-10-CM | POA: Diagnosis not present

## 2018-07-08 DIAGNOSIS — R5383 Other fatigue: Secondary | ICD-10-CM | POA: Diagnosis not present

## 2018-07-08 DIAGNOSIS — I4891 Unspecified atrial fibrillation: Secondary | ICD-10-CM | POA: Diagnosis not present

## 2018-08-05 DIAGNOSIS — E291 Testicular hypofunction: Secondary | ICD-10-CM | POA: Diagnosis not present

## 2018-08-09 DIAGNOSIS — E291 Testicular hypofunction: Secondary | ICD-10-CM | POA: Diagnosis not present

## 2018-08-23 DIAGNOSIS — F4312 Post-traumatic stress disorder, chronic: Secondary | ICD-10-CM | POA: Diagnosis not present

## 2018-09-03 DIAGNOSIS — M25579 Pain in unspecified ankle and joints of unspecified foot: Secondary | ICD-10-CM | POA: Diagnosis not present

## 2018-09-03 DIAGNOSIS — R042 Hemoptysis: Secondary | ICD-10-CM | POA: Diagnosis not present

## 2018-09-03 DIAGNOSIS — I480 Paroxysmal atrial fibrillation: Secondary | ICD-10-CM | POA: Diagnosis not present

## 2018-09-03 DIAGNOSIS — F4312 Post-traumatic stress disorder, chronic: Secondary | ICD-10-CM | POA: Diagnosis not present

## 2018-09-17 ENCOUNTER — Ambulatory Visit: Payer: Self-pay | Admitting: *Deleted

## 2018-09-17 ENCOUNTER — Other Ambulatory Visit: Payer: Self-pay

## 2018-09-17 ENCOUNTER — Encounter (HOSPITAL_BASED_OUTPATIENT_CLINIC_OR_DEPARTMENT_OTHER): Payer: Self-pay | Admitting: *Deleted

## 2018-09-17 ENCOUNTER — Emergency Department (HOSPITAL_BASED_OUTPATIENT_CLINIC_OR_DEPARTMENT_OTHER)
Admission: EM | Admit: 2018-09-17 | Discharge: 2018-09-17 | Disposition: A | Discharge status: Home / Self Care | Payer: No Typology Code available for payment source | Attending: Emergency Medicine | Admitting: Emergency Medicine

## 2018-09-17 ENCOUNTER — Emergency Department (HOSPITAL_BASED_OUTPATIENT_CLINIC_OR_DEPARTMENT_OTHER): Payer: No Typology Code available for payment source

## 2018-09-17 DIAGNOSIS — I4891 Unspecified atrial fibrillation: Secondary | ICD-10-CM | POA: Insufficient documentation

## 2018-09-17 DIAGNOSIS — I10 Essential (primary) hypertension: Secondary | ICD-10-CM | POA: Insufficient documentation

## 2018-09-17 DIAGNOSIS — Z79899 Other long term (current) drug therapy: Secondary | ICD-10-CM | POA: Insufficient documentation

## 2018-09-17 DIAGNOSIS — R109 Unspecified abdominal pain: Secondary | ICD-10-CM | POA: Diagnosis not present

## 2018-09-17 DIAGNOSIS — Z7901 Long term (current) use of anticoagulants: Secondary | ICD-10-CM | POA: Insufficient documentation

## 2018-09-17 DIAGNOSIS — R1032 Left lower quadrant pain: Secondary | ICD-10-CM | POA: Diagnosis not present

## 2018-09-17 DIAGNOSIS — R1031 Right lower quadrant pain: Secondary | ICD-10-CM | POA: Diagnosis not present

## 2018-09-17 DIAGNOSIS — Z87891 Personal history of nicotine dependence: Secondary | ICD-10-CM | POA: Insufficient documentation

## 2018-09-17 DIAGNOSIS — R319 Hematuria, unspecified: Secondary | ICD-10-CM | POA: Diagnosis not present

## 2018-09-17 LAB — COMPREHENSIVE METABOLIC PANEL
ALT: 28 U/L (ref 0–44)
AST: 23 U/L (ref 15–41)
Albumin: 4 g/dL (ref 3.5–5.0)
Alkaline Phosphatase: 56 U/L (ref 38–126)
Anion gap: 10 (ref 5–15)
BUN: 11 mg/dL (ref 6–20)
CO2: 24 mmol/L (ref 22–32)
Calcium: 8.9 mg/dL (ref 8.9–10.3)
Chloride: 105 mmol/L (ref 98–111)
Creatinine, Ser: 1.01 mg/dL (ref 0.61–1.24)
GFR calc Af Amer: >60 mL/min (ref >60)
GFR calc non Af Amer: >60 mL/min (ref >60)
Glucose, Bld: 95 mg/dL (ref 70–99)
Potassium: 3.9 mmol/L (ref 3.5–5.1)
Sodium: 139 mmol/L (ref 135–145)
Total Bilirubin: 0.6 mg/dL (ref 0.3–1.2)
Total Protein: 7.5 g/dL (ref 6.5–8.1)

## 2018-09-17 LAB — CBC WITH DIFFERENTIAL/PLATELET
Abs Immature Granulocytes: 0.01 10*3/uL (ref 0.00–0.07)
Basophils Absolute: 0.1 10*3/uL (ref 0.0–0.1)
Basophils Relative: 1 %
Eosinophils Absolute: 0.1 10*3/uL (ref 0.0–0.5)
Eosinophils Relative: 2 %
HCT: 51.4 % (ref 39.0–52.0)
Hemoglobin: 16.9 g/dL (ref 13.0–17.0)
Immature Granulocytes: 0 %
Lymphocytes Relative: 37 %
Lymphs Abs: 2.7 10*3/uL (ref 0.7–4.0)
MCH: 29.1 pg (ref 26.0–34.0)
MCHC: 32.9 g/dL (ref 30.0–36.0)
MCV: 88.5 fL (ref 80.0–100.0)
Monocytes Absolute: 0.7 10*3/uL (ref 0.1–1.0)
Monocytes Relative: 10 %
Neutro Abs: 3.6 10*3/uL (ref 1.7–7.7)
Neutrophils Relative %: 50 %
Platelets: 231 10*3/uL (ref 150–400)
RBC: 5.81 MIL/uL (ref 4.22–5.81)
RDW: 14.7 % (ref 11.5–15.5)
WBC: 7.3 10*3/uL (ref 4.0–10.5)
nRBC: 0 % (ref 0.0–0.2)

## 2018-09-17 LAB — URINALYSIS, ROUTINE W REFLEX MICROSCOPIC
Glucose, UA: NEGATIVE mg/dL
Hgb urine dipstick: NEGATIVE
Ketones, ur: NEGATIVE mg/dL
Leukocytes,Ua: NEGATIVE
Nitrite: NEGATIVE
Protein, ur: NEGATIVE mg/dL
Specific Gravity, Urine: 1.02 (ref 1.005–1.030)
pH: 7 (ref 5.0–8.0)

## 2018-09-17 MED ORDER — CYCLOBENZAPRINE HCL 10 MG PO TABS: 10.0000 mg | ORAL_TABLET | Freq: Two times a day (BID) | ORAL | 0 refills | Status: DC | PRN

## 2018-09-17 MED ORDER — KETOROLAC TROMETHAMINE 30 MG/ML IJ SOLN
30.0000 mg | Freq: Once | INTRAMUSCULAR | Status: AC
  Administered 2018-09-17: 30 mg via INTRAVENOUS
  Filled 2018-09-17: qty 1

## 2018-09-17 NOTE — Telephone Encounter (Signed)
Patient was out of town and after urinating- patient bleed through jeans. This occurred until at least 2 am-( he came home). Patient has not had bleeding since 2 am- but is having pressure and back pain. Due to passing of pure blood- patient advised ED for evaluation. Reason for Disposition . Passing pure blood or large blood clots (i.e., size > a dime) (Exception: fleck or small strands)  Answer Assessment - Initial Assessment Questions 1. COLOR of URINE: "Describe the color of the urine."  (e.g., tea-colored, pink, red, blood clots, bloody)     Normal color with urine- only bleeding after 2. ONSET: "When did the bleeding start?"      Started last night-10:00 3. EPISODES: "How many times has there been blood in the urine?" or "How many times today?"     3 at least- has not occurred today 4. PAIN with URINATION: "Is there any pain with passing your urine?" If so, ask: "How bad is the pain?"  (Scale 1-10; or mild, moderate, severe)    - MILD - complains slightly about urination hurting    - MODERATE - interferes with normal activities      - SEVERE - excruciating, unwilling or unable to urinate because of the pain      No pain during episode- today lower back pain and pressure 5. FEVER: "Do you have a fever?" If so, ask: "What is your temperature, how was it measured, and when did it start?"     No fever 6. ASSOCIATED SYMPTOMS: "Are you passing urine more frequently than usual?"     Pressure, back pain 7. OTHER SYMPTOMS: "Do you have any other symptoms?" (e.g., back/flank pain, abdominal pain, vomiting)     Back pain 8. PREGNANCY: "Is there any chance you are pregnant?" "When was your last menstrual period?"     n/a  Protocols used: URINE - BLOOD IN-A-AH

## 2018-09-17 NOTE — Discharge Instructions (Signed)
You have been seen today for blood in urine. Please read and follow all provided instructions. Return to the emergency room for worsening condition or new concerning symptoms.    1. Medications:  Prescription sent to your pharmacy for Flexeril. This is a muscle relaxer, please do not drive or work while taking these. Continue usual home medications Take medications as prescribed. Please review all of the medicines and only take them if you do not have an allergy to them.   2. Treatment: rest, drink plenty of fluids  3. Follow Up: Please follow up with urology. The office information is included in your paperwork.  It is also a possibility that you have an allergic reaction to any of the medicines that you have been prescribed - Everybody reacts differently to medications and while MOST people have no trouble with most medicines, you may have a reaction such as nausea, vomiting, rash, swelling, shortness of breath. If this is the case, please stop taking the medicine immediately and contact your physician.  ?

## 2018-09-17 NOTE — ED Notes (Signed)
Provider at bedside

## 2018-09-17 NOTE — ED Provider Notes (Signed)
[facility]ERGENC[facility] DEPARTMENT Provider Note   CSN: [id number] Arrival date & time: 09/17/18  1304    History   Chief Complaint Chief Complaint  Patient presents with  . Flank Pain    HPI Scott Walter is a 48 y.o. male with past medical history of anxiety, bipolar, afib on xarelto, PTSD presents emergency department today with chief complaint of hematuria.  Onset was acute happening 5 hours prior to arrival.  Patient states he woke up around 1 AM and after urinating started to pee blood.  It was bright red.  He denies any associated pain.  He had 2 more episodes of the same.  He reports associated bilateral flank pain that started after first episode hematuria.  He states the pain is cramping.  It does not radiate.  He rates it 7 of 10 in severity.  Did not take any medications for his symptoms prior to arrival.  He denies fever, chills, abdominal pain, chest pain, shortness of breath, nausea, vomiting, penile discharge, dysuria, urinary frequency, diarrhea.  He denies history of hematuria or kidney stones.  History provided by patient with additional history obtained from chart review.       Past Medical History:  Diagnosis Date  . Anxiety   . Bipolar disorder ([place])    h/o SI  . Chronic insomnia   . Chronic lower back pain   . Depression   . Dyslipidemia   . Essential hypertension   . ETOH abuse   . ZMOQHUTM(546.5)    "monthly" (01/26/2013)  . History of stomach ulcers ~ 2009  . Midsternal chest pain    a. 06/2014 Myoview: EF 50%, no ischemia/infarct.  . Migraine    "maybe 2 in my lifetime" (01/26/2013)  . PAF (paroxysmal atrial fibrillation) ([place])    a. Dx 05/2012;  b. 06/2014 Xarelto started in setting of ? TIA (CHA2DS2VASc = 3);  c. 06/2014 Echo: EF 60-65%, no rwma.  [name] PTSD (post-traumatic stress disorder)     Patient Active Problem List   Diagnosis Date Noted  . OSA (obstructive sleep apnea) 06/10/2017  . Preventative health care 09/06/2014  .  Testicular discomfort 09/06/2014  . Low back pain 09/06/2014  . Dyslipidemia   . Chest pain 07/18/2014  . Visual disturbance 07/18/2014  . Paroxysmal atrial fibrillation ([place]) 07/18/2014  . Chest pain with moderate risk for cardiac etiology 07/18/2014  . Amaurosis fugax of right eye 07/18/2014  . TIA (transient ischemic attack)   . Shortness of breath 03/02/2013  . Chronic insomnia 03/02/2013  . Bipolar disorder ([place]) 03/02/2013  . Precordial pain 01/26/2013  . Hyperglycemia 01/26/2013  . Essential hypertension 06/12/2012  . ETOH abuse 06/12/2012    Past Surgical History:  Procedure Laterality Date  . KNEE ARTHROSCOPY Right 09/2011  . KNEE SURGERY  02/2017   right knee  . SHOULDER SURGERY    . VASECTOMY  09/2017        Home Medications    Prior to Admission medications   Medication Sig Start Date End Date Taking? Authorizing Provider  albuterol (PROVENTIL HFA;VENTOLIN HFA) 108 (90 Base) MCG/ACT inhaler Inhale 2 puffs into the lungs every 6 (six) hours as needed for wheezing or shortness of breath. 07/21/16   Fay Records, MD  amLODipine (NORVASC) 2.5 MG tablet Take 1 tablet (2.5 mg total) by mouth daily. 03/01/18   Fay Records, MD  Artificial Tear Ointment (DRY EYES OP) Apply to eye.    [provider]  atenolol (TENORMIN)  25 MG tablet Take 1 tablet (25 mg total) by mouth daily. 03/23/17   Fay Records, MD  cetirizine (ZYRTEC) 10 MG tablet Take 10 mg by mouth daily.    [provider]  Coenzyme Q10 (COQ10 GUMMIES ADULT PO) Take 200 mg by mouth 2 (two) times daily.    [provider]  cyclobenzaprine (FLEXERIL) 10 MG tablet Take 1 tablet (10 mg total) by mouth 2 (two) times daily as needed for muscle spasms. 09/17/18   Albrizze, Kaitlyn E, PA-C  ergocalciferol (VITAMIN D2) 1.25 MG (50000 UT) capsule Take 50,000 Units by mouth once a week.  03/18/18   [provider]  flecainide (TAMBOCOR) 50 MG tablet Take 1.5 tablets (75 mg total) by mouth 2  (two) times daily. 01/13/18   Fay Records, MD  fluticasone (FLONASE) 50 MCG/ACT nasal spray Place 1 spray into both nostrils daily. Patient taking differently: Place 1 spray into both nostrils as needed for allergies.  04/29/17   Petrucelli, Glynda Jaeger, PA-C  Melatonin 10 MG CAPS Take 3 capsules by mouth at bedtime as needed.    [provider]  omeprazole (PRILOSEC) 20 MG capsule Take 1 capsule (20 mg total) by mouth 2 (two) times daily before a meal. 06/04/17   Fay Records, MD  [place]    [provider]  triamcinolone cream (KENALOG) 0.1 % Apply 1 application topically 2 (two) times daily as needed. 07/28/16   Burchette, Alinda Sierras, MD  XARELTO 20 MG TABS tablet TAKE 1 TABLET BY MOUTH EVERY DAY WITH SUPPER 12/30/17   Fay Records, MD    Family History Family History  Problem Relation Age of Onset  . CAD Mother   . Diabetes Mother   . Emphysema Father   . Healthy Sister   . Healthy Brother   . Healthy Sister   . Healthy Sister   . Healthy Sister   . Healthy Sister   . Healthy Sister   . Healthy Sister   . Healthy Sister   . Healthy Brother   . Healthy Brother   . Healthy Brother   . HIV Brother     Social History Social History   Tobacco Use  . Smoking status: Former Smoker    Packs/day: 0.75    Years: 30.00    Pack years: 22.50    Types: Cigarettes    Quit date: 03/07/2017    Years since quitting: 1.5  . Smokeless tobacco: Former Systems developer    Types: Chew  . Tobacco comment: 01/26/2013 "quit chewing > 20 yr ago"  Substance Use Topics  . Alcohol use: Yes    Comment: occ  . Drug use: No     Allergies   Ambien [zolpidem tartrate], Divalproex sodium, Valproic acid, and Zolpidem   Review of Systems Review of Systems  Constitutional: Negative for chills and fever.  HENT: Negative for congestion, rhinorrhea, sinus pressure and sore throat.   Eyes: Negative for pain and redness.  Respiratory:  Negative for cough, shortness of breath and wheezing.   Cardiovascular: Negative for chest pain and palpitations.  Gastrointestinal: Negative for abdominal pain, constipation, diarrhea, nausea and vomiting.  Genitourinary: Positive for flank pain and hematuria. Negative for dysuria.  Musculoskeletal: Negative for arthralgias, back pain, myalgias and neck pain.  Skin: Negative for rash and wound.  Neurological: Negative for dizziness, syncope, weakness, numbness and headaches.  Psychiatric/Behavioral: Negative for confusion.     Physical Exam Updated  Vital Signs BP 134/86   Pulse 70   Temp 98.1 F (36.7 C) (Oral)   Resp 16   Ht 5\' 9"  (1.753 m)   Wt 122.9 kg   SpO2 100%   BMI 40.02 kg/m   Physical Exam Vitals signs and nursing note reviewed.  Constitutional:      General: He is not in acute distress.    Appearance: He is not ill-appearing.  HENT:     Head: Normocephalic and atraumatic.     Right Ear: Tympanic membrane and external ear normal.     Left Ear: Tympanic membrane and external ear normal.     Nose: Nose normal.     Mouth/Throat:     Mouth: Mucous membranes are moist.     Pharynx: Oropharynx is clear.  Eyes:     General: No scleral icterus.       Right eye: No discharge.        Left eye: No discharge.     Extraocular Movements: Extraocular movements intact.     Conjunctiva/sclera: Conjunctivae normal.     Pupils: Pupils are equal, round, and reactive to light.  Neck:     Musculoskeletal: Normal range of motion.     Vascular: No JVD.  Cardiovascular:     Rate and Rhythm: Normal rate and regular rhythm.     Pulses: Normal pulses.          Radial pulses are 2+ on the right side and 2+ on the left side.     Heart sounds: Normal heart sounds.  Pulmonary:     Comments: Lungs clear to auscultation in all fields. Symmetric chest rise. No wheezing, rales, or rhonchi. Abdominal:     Tenderness: There is right CVA tenderness and left CVA tenderness.     Comments:  Abdomen is soft, non-distended, and non-tender in all quadrants. No rigidity, no guarding. No peritoneal signs.  Genitourinary:    Comments: Chaperone Juanetta NT present for exam. No discharge or urethritis noted. No blood noted. No signs of sores or lesions or erythema on the penis or testicles. The penis and testicles are nontender. No testicular masses or swelling. No scrotal swelling. No signs of any inguinal hernias. Cremaster reflex present bilaterally.   Musculoskeletal: Normal range of motion.  Skin:    General: Skin is warm and dry.     Capillary Refill: Capillary refill takes less than 2 seconds.  Neurological:     Mental Status: He is oriented to person, place, and time.     GCS: GCS eye subscore is 4. GCS verbal subscore is 5. GCS motor subscore is 6.     Comments: Fluent speech, no facial droop.  Psychiatric:        Behavior: Behavior normal.      ED Treatments / Results  Labs (all labs ordered are listed, but only abnormal results are displayed) Labs Reviewed  URINALYSIS, ROUTINE W REFLEX MICROSCOPIC - Abnormal; Notable for the following components:      Result Value   Color, Urine AMBER (*)    Bilirubin Urine SMALL (*)    All other components within normal limits  CBC WITH DIFFERENTIAL/PLATELET  COMPREHENSIVE METABOLIC PANEL    EKG None  Radiology Ct Renal Stone Study  Result Date: 09/17/2018 CLINICAL DATA:  Flank pain. EXAM: CT ABDOMEN AND PELVIS WITHOUT CONTRAST TECHNIQUE: Multidetector CT imaging of the abdomen and pelvis was performed following the standard protocol without IV contrast. COMPARISON:  None FINDINGS: Lower chest: No acute abnormality.  Hepatobiliary: No focal liver abnormality is seen. No gallstones, gallbladder wall thickening, or biliary dilatation. Pancreas: Unremarkable. No pancreatic ductal dilatation or surrounding inflammatory changes. Spleen: Normal in size without focal abnormality. Adrenals/Urinary Tract: Normal adrenal glands. No  kidney stones or hydronephrosis bilaterally. No ureteral calculi. Stomach/Bowel: Stomach is normal. No evidence of bowel wall thickening, distension or inflammation. Normal appendix. Vascular/Lymphatic: No significant vascular findings are present. No enlarged abdominal or pelvic lymph nodes. Reproductive: Prostate is unremarkable. Other: No abdominal wall hernia or abnormality. No abdominopelvic ascites. Musculoskeletal: No acute or significant osseous findings. IMPRESSION: 1. No acute findings within the abdomen or pelvis. No urinary tract calculi or hydronephrosis identified. Electronically Signed   By: Kerby Moors M.D.   On: 09/17/2018 17:03    Procedures Procedures (including critical care time)  Medications Ordered in ED Medications  ketorolac (TORADOL) 30 MG/ML injection 30 mg (30 mg Intravenous Given 09/17/18 1524)     Initial Impression / Assessment and Plan / ED Course  I have reviewed the triage vital signs and the nursing notes.  Pertinent labs & imaging results that were available during my care of the patient were reviewed by me and considered in my medical decision making (see chart for details).  48 year old male presents with hematuria bilateral flank pain.  Appears uncomfortable otherwise nontoxic in appearance.  No abdominal tenderness, no peritoneal signs. He has bilateral CVA tenderness on exam.  CMP and CBC are unremarkable.  Hemoglobin is stable at 16.9.  UA without signs of infection, negative for blood.  CT renal is negative for kidney stone or hydronephrosis.  Patient is hemodynamically stable, in NAD, and able to ambulate in the ED. Evaluation does not show pathology that would require ongoing emergent intervention or inpatient treatment. Will recommend urology follow up. Pain has been managed and has no complaints prior to discharge. Patient is comfortable with above plan and is stable for discharge at this time. All questions were answered prior to disposition.  Strict return precautions for returning to the ED were discussed. Encouraged follow up with urology for further work-up of hematuria and pcp.Findings and plan of care discussed with supervising physician Dr. Tamera Punt  This note was prepared using Dragon voice recognition software and may include unintentional dictation errors due to the inherent limitations of voice recognition software.    Final Clinical Impressions(s) / ED Diagnoses   Final diagnoses:  Hematuria, unspecified type    ED Discharge Orders         Ordered    cyclobenzaprine (FLEXERIL) 10 MG tablet  2 times daily PRN     09/17/18 1717           Albrizze, Harley Hallmark, PA-C 09/17/18 1826    Malvin Johns, MD 09/17/18 2330

## 2018-09-17 NOTE — ED Triage Notes (Addendum)
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

## 2018-09-18 NOTE — Telephone Encounter (Signed)
Agree with advice given

## 2018-09-20 DIAGNOSIS — M2241 Chondromalacia patellae, right knee: Secondary | ICD-10-CM | POA: Diagnosis not present

## 2018-09-28 DIAGNOSIS — F4312 Post-traumatic stress disorder, chronic: Secondary | ICD-10-CM | POA: Diagnosis not present

## 2018-10-05 DIAGNOSIS — E291 Testicular hypofunction: Secondary | ICD-10-CM | POA: Diagnosis not present

## 2018-10-05 DIAGNOSIS — R937 Abnormal findings on diagnostic imaging of other parts of musculoskeletal system: Secondary | ICD-10-CM | POA: Diagnosis not present

## 2018-10-08 DIAGNOSIS — M25579 Pain in unspecified ankle and joints of unspecified foot: Secondary | ICD-10-CM | POA: Diagnosis not present

## 2018-10-11 DIAGNOSIS — S93491A Sprain of other ligament of right ankle, initial encounter: Secondary | ICD-10-CM | POA: Diagnosis not present

## 2018-10-11 DIAGNOSIS — M25571 Pain in right ankle and joints of right foot: Secondary | ICD-10-CM | POA: Diagnosis not present

## 2018-10-20 DIAGNOSIS — S93401A Sprain of unspecified ligament of right ankle, initial encounter: Secondary | ICD-10-CM | POA: Insufficient documentation

## 2018-10-20 DIAGNOSIS — S93401D Sprain of unspecified ligament of right ankle, subsequent encounter: Secondary | ICD-10-CM | POA: Diagnosis not present

## 2018-11-09 DIAGNOSIS — E291 Testicular hypofunction: Secondary | ICD-10-CM | POA: Diagnosis not present

## 2018-11-10 ENCOUNTER — Encounter: Payer: Self-pay | Admitting: Family Medicine

## 2018-11-15 ENCOUNTER — Telehealth: Payer: Self-pay | Admitting: *Deleted

## 2018-11-15 ENCOUNTER — Telehealth: Payer: Self-pay | Admitting: Internal Medicine

## 2018-11-15 NOTE — Telephone Encounter (Signed)
STAT if HR is under 50 or over 120 (normal HR is 60-100 beats per minute)  What is your heart rate?  Last heart rate was 133, not sure what is it right now 1) Do you have a log of your heart rate readings (document readings)? no  2) Do you have any other symptoms? Dizziness, shortness of breath, chest pain

## 2018-11-15 NOTE — Telephone Encounter (Signed)
I spoke with pt's wife who reports pt is currently feeling fine. He has had 4-5 episodes of increased heart rate over the last 2 weeks. Lasts 10-30 minutes and then goes back to normal. Does not feel irregular but pt aware of increased heart rate. Has dizziness and pain with this. Occurs at random times during the day. In the past episodes have been related to job stress but wife reports pt has not been working recently due to sprained ankle. Denies any recent increase in stress. Is taking Xarelto, atenolol and flecainide as listed. Will forward to Dr. [name] for review/recommendations.

## 2018-11-15 NOTE — Telephone Encounter (Signed)
Copied from [facility] [id number]. Topic: General - Other >> Nov 15, 2018 10:08 AM Rainey Pines A wrote: Patient recently had MRI done and would like to get the images to Dr. Elease Hashimoto before upcoming vst on 9/30. Patients wife can be reached at [phone]

## 2018-11-16 NOTE — Telephone Encounter (Signed)
Pt's wife called to follow up on the best way to get pt's MRI images that were done at the [facility] to Dr. Elease Hashimoto. She wants to know if it is okay to bring images on flash drive. Pt is scheduled for virtual appt on tomorrow 11/17/18 to go over results. Requesting callback. [phone]

## 2018-11-17 ENCOUNTER — Other Ambulatory Visit: Payer: Self-pay

## 2018-11-17 ENCOUNTER — Ambulatory Visit (INDEPENDENT_AMBULATORY_CARE_PROVIDER_SITE_OTHER): Payer: Federal, State, Local not specified - PPO | Admitting: Family Medicine

## 2018-11-17 DIAGNOSIS — R9089 Other abnormal findings on diagnostic imaging of central nervous system: Secondary | ICD-10-CM

## 2018-11-17 DIAGNOSIS — R002 Palpitations: Secondary | ICD-10-CM | POA: Diagnosis not present

## 2018-11-17 NOTE — Progress Notes (Signed)
This visit type was conducted due to national recommendations for restrictions regarding the COVID-19 pandemic in an effort to limit this patient's exposure and mitigate transmission in our community.   Virtual Visit via Video Note  I connected with Scott Walter on 11/17/18 at 11:00 AM EDT by a video enabled telemedicine application and verified that I am speaking with the correct person using two identifiers.  Location patient: home Location provider:work or home office Persons participating in the virtual visit: patient, provider  I discussed the limitations of evaluation and management by telemedicine and the availability of in person appointments. The patient expressed understanding and agreed to proceed.   HPI: Patient had called to review recent MRI of the brain results from the [facility].  He is on testosterone therapy and apparently MRI was scheduled to rule out any kind of pituitary problem. MRI did not show any pituitary lesions.  There were no evidence for any active or acute intracranial disease.  There was comment of "abnormal marrow signal upper cervical vertebrae suggesting hyperplastic marrow frequently resulting from anemia".  Patient actually has had CBC and comprehensive metabolic panel back in July and no evidence for anemia nor does he have any history of known anemia.  No acute blood loss since July.  His recent hemoglobin was 16.9 MRI report from the [place] [facility] was reviewed.  We were unable to pull up the actual image on flash drive that was sent  His chronic problems include hypertension, history of A. fib, history of TIA, dyslipidemia.  He has had increased palpitations recently and some occasional atypical chest pain.  Intermittent dizziness but no syncope.  He has scheduled follow-up with cardiology in October    ROS: See pertinent positives and negatives per HPI.  Past Medical History:  Diagnosis Date  . Anxiety   . Bipolar disorder ([place])    h/o SI  . Chronic  insomnia   . Chronic lower back pain   . Depression   . Dyslipidemia   . Essential hypertension   . ETOH abuse   . [id number])    "monthly" (01/26/2013)  . History of stomach ulcers ~ 2009  . Midsternal chest pain    a. 06/2014 Myoview: EF 50%, no ischemia/infarct.  . Migraine    "maybe 2 in my lifetime" (01/26/2013)  . PAF (paroxysmal atrial fibrillation) ([place])    a. Dx 05/2012;  b. 06/2014 Xarelto started in setting of ? TIA (CHA2DS2VASc = 3);  c. 06/2014 Echo: EF 60-65%, no rwma.  [name] PTSD (post-traumatic stress disorder)     Past Surgical History:  Procedure Laterality Date  . KNEE ARTHROSCOPY Right 09/2011  . KNEE SURGERY  02/2017   right knee  . SHOULDER SURGERY    . VASECTOMY  09/2017    Family History  Problem Relation Age of Onset  . CAD Mother   . Diabetes Mother   . Emphysema Father   . Healthy Sister   . Healthy Brother   . Healthy Sister   . Healthy Sister   . Healthy Sister   . Healthy Sister   . Healthy Sister   . Healthy Sister   . Healthy Sister   . Healthy Brother   . Healthy Brother   . Healthy Brother   . HIV Brother     SOCIAL HX: Quit smoking earlier this year   Current Outpatient Medications:  .  albuterol (PROVENTIL HFA;VENTOLIN HFA) 108 (90 Base) MCG/ACT inhaler, Inhale 2 puffs into the lungs every 6 (  six) hours as needed for wheezing or shortness of breath., Disp: 1 Inhaler, Rfl: 2 .  amLODipine (NORVASC) 2.5 MG tablet, Take 1 tablet (2.5 mg total) by mouth daily., Disp: 90 tablet, Rfl: 3 .  Artificial Tear Ointment (DRY EYES OP), Apply to eye., Disp: , Rfl:  .  atenolol (TENORMIN) 25 MG tablet, Take 1 tablet (25 mg total) by mouth daily., Disp: 90 tablet, Rfl: 3 .  cetirizine (ZYRTEC) 10 MG tablet, Take 10 mg by mouth daily., Disp: , Rfl:  .  Coenzyme Q10 (COQ10 GUMMIES ADULT PO), Take 200 mg by mouth 2 (two) times daily., Disp: , Rfl:  .  cyclobenzaprine (FLEXERIL) 10 MG tablet, Take 1 tablet (10 mg total) by mouth 2 (two) times daily  as needed for muscle spasms., Disp: 8 tablet, Rfl: 0 .  ergocalciferol (VITAMIN D2) 1.25 MG (50000 UT) capsule, Take 50,000 Units by mouth once a week. , Disp: , Rfl:  .  flecainide (TAMBOCOR) 50 MG tablet, Take 1.5 tablets (75 mg total) by mouth 2 (two) times daily., Disp: 90 tablet, Rfl: 6 .  fluticasone (FLONASE) 50 MCG/ACT nasal spray, Place 1 spray into both nostrils daily. (Patient taking differently: Place 1 spray into both nostrils as needed for allergies. ), Disp: 16 g, Rfl: 2 .  Melatonin 10 MG CAPS, Take 3 capsules by mouth at bedtime as needed., Disp: , Rfl:  .  omeprazole (PRILOSEC) 20 MG capsule, Take 1 capsule (20 mg total) by mouth 2 (two) times daily before a meal., Disp: 90 capsule, Rfl: 3 .  OVER THE COUNTER MEDICATION, [employer] Apple Cider Vinegar, Disp: , Rfl:  .  triamcinolone cream (KENALOG) 0.1 %, Apply 1 application topically 2 (two) times daily as needed., Disp: 80 g, Rfl: 2 .  XARELTO 20 MG TABS tablet, TAKE 1 TABLET BY MOUTH EVERY DAY WITH SUPPER, Disp: 90 tablet, Rfl: 1  EXAM:  VITALS per patient if applicable:  GENERAL: alert, oriented, appears well and in no acute distress  HEENT: atraumatic, conjunttiva clear, no obvious abnormalities on inspection of external nose and ears  NECK: normal movements of the head and neck  LUNGS: on inspection no signs of respiratory distress, breathing rate appears normal, no obvious gross SOB, gasping or wheezing  CV: no obvious cyanosis  MS: moves all visible extremities without noticeable abnormality  PSYCH/NEURO: pleasant and cooperative, no obvious depression or anxiety, speech and thought processing grossly intact  ASSESSMENT AND PLAN:  Discussed the following assessment and plan:  #1 recent abnormal MRI of brain.  Nonspecific findings of abnormal marrow signal upper cervical vertebra.  There was recommendation for clinical correlation whether he could be anemic.  He had recent CBC that was  unremarkable. -Reassurance with no further evaluation at this time  #2 increased palpitations.  He has had fairly extensive cardiac work-up previously and has scheduled follow-up with cardiology. Had exercise tolerance test last December which was unremarkable.    I discussed the assessment and treatment plan with the patient. The patient was provided an opportunity to ask questions and all were answered. The patient agreed with the plan and demonstrated an understanding of the instructions.   The patient was advised to call back or seek an in-person evaluation if the symptoms worsen or if the condition fails to improve as anticipated.     Carolann Littler, MD

## 2018-11-17 NOTE — Telephone Encounter (Signed)
Patient had an appointment today. This was discussed during the visit.

## 2018-11-22 NOTE — Telephone Encounter (Signed)
Agree with plan to be seen

## 2018-11-22 NOTE — Telephone Encounter (Signed)
Pt is scheduled with APP on 11/26/18.

## 2018-11-26 ENCOUNTER — Ambulatory Visit (INDEPENDENT_AMBULATORY_CARE_PROVIDER_SITE_OTHER): Payer: Federal, State, Local not specified - PPO | Admitting: Cardiology

## 2018-11-26 ENCOUNTER — Encounter: Payer: Self-pay | Admitting: Cardiology

## 2018-11-26 ENCOUNTER — Other Ambulatory Visit: Payer: Self-pay

## 2018-11-26 VITALS — BP 118/82 | HR 67 | Ht 69.0 in | Wt 278.6 lb

## 2018-11-26 DIAGNOSIS — I480 Paroxysmal atrial fibrillation: Secondary | ICD-10-CM

## 2018-11-26 DIAGNOSIS — R0600 Dyspnea, unspecified: Secondary | ICD-10-CM | POA: Diagnosis not present

## 2018-11-26 DIAGNOSIS — R079 Chest pain, unspecified: Secondary | ICD-10-CM

## 2018-11-26 DIAGNOSIS — R0609 Other forms of dyspnea: Secondary | ICD-10-CM

## 2018-11-26 NOTE — Patient Instructions (Signed)
Medication Instructions:  Your physician has recommended you make the following change in your medication:   You may take an extra 50 mg tablet of flecainide AS NEEDED for fast heartbeats Lab work: None Ordered  If you have labs (blood work) drawn today and your tests are completely normal, you will receive your results only by: [name] MyChart Message (if you have MyChart) OR . A paper copy in the mail If you have any lab test that is abnormal or we need to change your treatment, we will call you to review the results.  Testing/Procedures: Your physician has recommended that you wear a 14 day monitor. These monitors are medical devices that record the heart's electrical activity. Doctors most often use these monitors to diagnose arrhythmias. Arrhythmias are problems with the speed or rhythm of the heartbeat. The monitor is a small, portable device. You can wear one while you do your normal daily activities. This is usually used to diagnose what is causing palpitations/syncope (passing out).  Your physician has requested that you have a lexiscan myoview. For further information please visit [email or web address]. Please follow instruction sheet, as given.   Follow-Up: . Follow up with Pecolia Ades, NP in 6 weeks  Any Other Special Instructions Will Be Listed Below (If Applicable).

## 2018-11-26 NOTE — Progress Notes (Signed)
Cardiology Office Note:    Date:  11/26/2018   ID:  Scott Walter, DOB [birth year], MRN [id number]  PCP:  Eulas Post, MD  Cardiologist:  Dorris Carnes, MD  Referring MD: Eulas Post, MD   Chief Complaint  Patient presents with  . Chest Pain    History of Present Illness:    Scott Walter is a 48 y.o. male with a past medical history significant for ministroke, PTSD, PAF 2014, EtOH abuse, hypertension, hyperlipidemia, bipolar disorder, chronic insomnia.  The patient is here with his significant other and she adds a lot of information.  A couple weeks ago the patient felt chest pressure and like he got kicked in the chest and felt dizzy.  This occurred during the night as he rolled over in bed.  He says he feels like he had a heart attack.  Also had shortness of breath. Lasted about 1.5 hours on that day. The next day he had 2 episodes that lasted about 2 hours each.  His episodes were not associated with activity.  He has had no further symptoms since.  When questioned he does admit to having palpitations, felt like an elephant beating a drum on his chest, fast, hard heartbeat.  He admits that this could have been some episodes of atrial fibrillation.   His significant other shows me blood pressures from around that time ranging 120s-140s/80s-90s.  Heart rates mostly in the 70s except for the day when he had the worst symptoms with heart rate of 133.   He has not had any recent episodes in the last week but he does complain of dyspnea on exertion, like walking up the stairs in his house, that is worse over the last couple of weeks.  He has no orthopnea.  He has mild edema of the right ankle due to a recent sprain.  Otherwise no other peripheral edema.  Car diac studies   Exercise tolerance test 01/27/2018 Study Highlights   Blood pressure demonstrated a normal response to exercise.  There was no ST segment deviation noted during stress.   Submaximal study, 81% MPHR.  No  evidence for ischemia by ST segment analysis at submaximal exercise.    Echocardiogram 07/19/2014 Study Conclusions  - Left ventricle: The cavity size was normal. Wall thickness was   increased in a pattern of mild LVH. Systolic function was normal.   The estimated ejection fraction was in the range of 60% to 65%.   Wall motion was normal; there were no regional wall motion   abnormalities.   Past Medical History:  Diagnosis Date  . Anxiety   . Bipolar disorder ([place])    h/o SI  . Chronic insomnia   . Chronic lower back pain   . Depression   . Dyslipidemia   . Essential hypertension   . ETOH abuse   . [id number])    "monthly" (01/26/2013)  . History of stomach ulcers ~ 2009  . Midsternal chest pain    a. 06/2014 Myoview: EF 50%, no ischemia/infarct.  . Migraine    "maybe 2 in my lifetime" (01/26/2013)  . PAF (paroxysmal atrial fibrillation) ([place])    a. Dx 05/2012;  b. 06/2014 Xarelto started in setting of ? TIA (CHA2DS2VASc = 3);  c. 06/2014 Echo: EF 60-65%, no rwma.  [name] PTSD (post-traumatic stress disorder)     Past Surgical History:  Procedure Laterality Date  . KNEE ARTHROSCOPY Right 09/2011  . KNEE SURGERY  02/2017   right knee  .  SHOULDER SURGERY    . VASECTOMY  09/2017    Current Medications: Current Meds  Medication Sig  . albuterol (PROVENTIL HFA;VENTOLIN HFA) 108 (90 Base) MCG/ACT inhaler Inhale 2 puffs into the lungs every 6 (six) hours as needed for wheezing or shortness of breath.  [name] amLODipine (NORVASC) 2.5 MG tablet Take 1 tablet (2.5 mg total) by mouth daily.  . Artificial Tear Ointment (DRY EYES OP) Apply to eye.  [name] atenolol (TENORMIN) 25 MG tablet Take 1 tablet (25 mg total) by mouth daily.  . cetirizine (ZYRTEC) 10 MG tablet Take 10 mg by mouth daily.  . cyclobenzaprine (FLEXERIL) 10 MG tablet Take 1 tablet (10 mg total) by mouth 2 (two) times daily as needed for muscle spasms.  . ergocalciferol (VITAMIN D2) 1.25 MG (50000 UT) capsule Take 50,000  Units by mouth once a week.   . flecainide (TAMBOCOR) 50 MG tablet Take 1.5 tablets (75 mg total) by mouth 2 (two) times daily.  . fluticasone (FLONASE) 50 MCG/ACT nasal spray Place 1 spray into both nostrils daily.  . Melatonin 10 MG CAPS Take 3 capsules by mouth at bedtime as needed.  [name] omeprazole (PRILOSEC) 20 MG capsule Take 1 capsule (20 mg total) by mouth 2 (two) times daily before a meal.  . OVER THE COUNTER MEDICATION [employer] G[employer]er  . sertraline (ZOLOFT) 100 MG tablet Take 50 mg by mouth daily.  [name] testosterone cypionate (DEPOTESTOTERONE CYPIONATE) 100 MG/ML injection Inject 200 mg into the muscle every 14 (fourteen) days. For IM use only  . triamcinolone cream (KENALOG) 0.1 % Apply 1 application topically 2 (two) times daily as needed.  Alveda Reasons 20 MG TABS tablet TAKE 1 TABLET BY MOUTH EVERY DAY WITH SUPPER     Allergies:   Ambien [zolpidem tartrate], Divalproex sodium, Valproic acid, and Zolpidem   Social History   Socioeconomic History  . Marital status: Married    Spouse name: Not on file  . Number of children: Not on file  . Years of education: Not on file  . Highest education level: Not on file  Occupational History  . Not on file  Social Needs  . Financial resource strain: Not on file  . Food insecurity    Worry: Not on file    Inability: Not on file  . Transportation needs    Medical: Not on file    Non-medical: Not on file  Tobacco Use  . Smoking status: Former Smoker    Packs/day: 0.75    Years: 30.00    Pack years: 22.50    Types: Cigarettes    Quit date: 03/07/2017    Years since quitting: 1.7  . Smokeless tobacco: Former Systems developer    Types: Chew  . Tobacco comment: 01/26/2013 "quit chewing > 20 yr ago"  Substance and Sexual Activity  . Alcohol use: Yes    Comment: occ  . Drug use: No  . Sexual activity: Not on file  Lifestyle  . Physical activity    Days per week: Not on file    Minutes per session: Not on file  . Stress: Not on  file  Relationships  . Social Herbalist on phone: Not on file    Gets together: Not on file    Attends religious service: Not on file    Active member of club or organization: Not on file    Attends meetings of clubs or organizations: Not on file    Relationship status: Not  on file  Other Topics Concern  . Not on file  Social History Narrative  . Not on file     Family History: The patient's family history includes CAD in his mother; Diabetes in his mother; Emphysema in his father; HIV in his brother; Healthy in his brother, brother, brother, brother, sister, sister, sister, sister, sister, sister, sister, and sister. ROS:   Please see the history of present illness.     All other systems reviewed and are negative.   EKG:  EKG is  ordered today.  The ekg ordered today demonstrates normal sinus rhythm, 67 bpm, no ischemic changes.  Recent Labs: 03/12/2018: TSH 0.64 09/17/2018: ALT 28; BUN 11; Creatinine, Ser 1.01; Hemoglobin 16.9; Platelets 231; Potassium 3.9; Sodium 139   Recent Lipid Panel    Component Value Date/Time   CHOL 180 03/12/2018   TRIG 134 03/12/2018   HDL 41.10 07/28/2016 1546   CHOLHDL 4 07/28/2016 1546   VLDL 37.4 07/28/2016 1546   LDLCALC 105 (H) 07/28/2016 1546    Physical Exam:    VS:  BP 118/82   Pulse 67   Ht 5\' 9"  (1.753 m)   Wt 278 lb 9.6 oz (126.4 kg)   SpO2 97%   BMI 41.14 kg/m     Wt Readings from Last 6 Encounters:  11/26/18 278 lb 9.6 oz (126.4 kg)  09/17/18 271 lb (122.9 kg)  05/31/18 265 lb (120.2 kg)  04/19/18 260 lb 1.6 oz (118 kg)  01/12/18 255 lb 3.2 oz (115.8 kg)  07/10/17 261 lb (118.4 kg)     Physical Exam  Constitutional: He is oriented to person, place, and time. He appears well-developed and well-nourished. No distress.  HENT:  Head: Normocephalic and atraumatic.  Neck: Normal range of motion. Neck supple. No JVD present.  Cardiovascular: Normal rate, regular rhythm, normal heart sounds and intact distal  pulses. Exam reveals no gallop and no friction rub.  No murmur heard. Pulmonary/Chest: Effort normal and breath sounds normal. No respiratory distress. He has no wheezes. He has no rales.  Abdominal: Soft. Bowel sounds are normal.  Musculoskeletal: Normal range of motion.     Comments: Trace right ankle swelling, otherwise no peripheral edema  Neurological: He is alert and oriented to person, place, and time.  Skin: Skin is warm and dry.  Psychiatric: He has a normal mood and affect. His behavior is normal. Judgment and thought content normal.  Vitals reviewed.    ASSESSMENT:    1. Chest pain, unspecified type   2. PAF (paroxysmal atrial fibrillation) ([place])   3. Dyspnea on exertion    PLAN:    In order of problems listed above:  Chest pain -Patient had 3 episodes of chest discomfort about 2 weeks ago lasting 1.5-2 hours, not associated with activity.  Also had concurrent hard/fast heartbeat.  Possibly having episodes of atrial fibrillation. -No ischemic changes on EKG. -We will place a 14-day ZIO patch to evaluate for A. fib burden. -Patient instructed that could take 1 extra flecainide pill if he has sustained fast heartbeat.  Paroxysmal atrial fibrillation -Has been controlled with flecainide.  Patient on Xarelto for stroke risk reduction.  As above, patient may be having rare episodes of breakthrough A. fib.  Can take 1 extra dose of flecainide if needed.  Will assess 14-day monitor for recurrent A. fib.  Dyspnea on exertion -More dyspnea on exertion with walking up stairs in his home and doing only moderate activities over the last few weeks. -No  exertional chest pain, only has had chest discomfort during the 3 episodes as described above.  Likely related to arrhythmia. -We will check Lexiscan Myoview.  Hypertension -BP well controlled     Medication Adjustments/Labs and Tests Ordered: Current medicines are reviewed at length with the patient today.  Concerns regarding  medicines are outlined above. Labs and tests ordered and medication changes are outlined in the patient instructions below:  Patient Instructions  Medication Instructions:  Your physician has recommended you make the following change in your medication:   You may take an extra 50 mg tablet of flecainide AS NEEDED for fast heartbeats Lab work: None Ordered  If you have labs (blood work) drawn today and your tests are completely normal, you will receive your results only by: [name] MyChart Message (if you have MyChart) OR . A paper copy in the mail If you have any lab test that is abnormal or we need to change your treatment, we will call you to review the results.  Testing/Procedures: Your physician has recommended that you wear a 14 day monitor. These monitors are medical devices that record the heart's electrical activity. Doctors most often use these monitors to diagnose arrhythmias. Arrhythmias are problems with the speed or rhythm of the heartbeat. The monitor is a small, portable device. You can wear one while you do your normal daily activities. This is usually used to diagnose what is causing palpitations/syncope (passing out).  Your physician has requested that you have a lexiscan myoview. For further information please visit [email or web address]. Please follow instruction sheet, as given.   Follow-Up: . Follow up with Pecolia Ades, NP in 6 weeks  Any Other Special Instructions Will Be Listed Below (If Applicable).       Signed, Daune Perch, NP  11/26/2018 7:35 PM    [facility]

## 2018-11-29 ENCOUNTER — Telehealth (HOSPITAL_COMMUNITY): Payer: Self-pay | Admitting: *Deleted

## 2018-11-29 NOTE — Telephone Encounter (Signed)
Left message on voicemail per DPR in reference to upcoming appointment scheduled on 12/02/18 with detailed instructions given per Myocardial Perfusion Study Information Sheet for the test. LM to arrive 15 minutes early, and that it is imperative to arrive on time for appointment to keep from having the test rescheduled. If you need to cancel or reschedule your appointment, please call the office within 24 hours of your appointment. Failure to do so may result in a cancellation of your appointment, and a $50 no show fee. Phone number given for call back for any questions. [name]   

## 2018-11-29 NOTE — Addendum Note (Signed)
Addended by: Jones Broom on: 11/29/2018 10:20 AM   Modules accepted: Orders

## 2018-11-30 DIAGNOSIS — K920 Hematemesis: Secondary | ICD-10-CM | POA: Diagnosis not present

## 2018-11-30 DIAGNOSIS — Z87891 Personal history of nicotine dependence: Secondary | ICD-10-CM | POA: Diagnosis not present

## 2018-11-30 DIAGNOSIS — I4891 Unspecified atrial fibrillation: Secondary | ICD-10-CM | POA: Diagnosis not present

## 2018-11-30 DIAGNOSIS — K219 Gastro-esophageal reflux disease without esophagitis: Secondary | ICD-10-CM | POA: Diagnosis not present

## 2018-12-02 ENCOUNTER — Ambulatory Visit (HOSPITAL_COMMUNITY): Payer: Federal, State, Local not specified - PPO | Attending: Cardiology

## 2018-12-02 ENCOUNTER — Other Ambulatory Visit: Payer: Self-pay

## 2018-12-02 DIAGNOSIS — R079 Chest pain, unspecified: Secondary | ICD-10-CM

## 2018-12-02 LAB — MYOCARDIAL PERFUSION IMAGING
LV dias vol: 144 mL (ref 62–150)
LV sys vol: 76 mL
Peak HR: 81 {beats}/min
Rest HR: 61 {beats}/min
SDS: 0
SRS: 0
SSS: 0
TID: 0.96

## 2018-12-02 MED ORDER — TECHNETIUM TC 99M TETROFOSMIN IV KIT
10.1000 | PACK | Freq: Once | INTRAVENOUS | Status: AC | PRN
  Administered 2018-12-02: 10.1 via INTRAVENOUS
  Filled 2018-12-02: qty 11

## 2018-12-02 MED ORDER — REGADENOSON 0.4 MG/5ML IV SOLN
0.4000 mg | Freq: Once | INTRAVENOUS | Status: AC
  Administered 2018-12-02: 0.4 mg via INTRAVENOUS

## 2018-12-02 MED ORDER — TECHNETIUM TC 99M TETROFOSMIN IV KIT
31.5000 | PACK | Freq: Once | INTRAVENOUS | Status: AC | PRN
  Administered 2018-12-02: 31.5 via INTRAVENOUS
  Filled 2018-12-02: qty 32

## 2018-12-06 ENCOUNTER — Telehealth: Payer: Self-pay

## 2018-12-06 NOTE — Telephone Encounter (Signed)
LM with brief monitor details. Ordered 14 day ZIO monitor to be delivered to pt's home address.

## 2018-12-07 ENCOUNTER — Ambulatory Visit (INDEPENDENT_AMBULATORY_CARE_PROVIDER_SITE_OTHER): Payer: Federal, State, Local not specified - PPO

## 2018-12-07 DIAGNOSIS — I480 Paroxysmal atrial fibrillation: Secondary | ICD-10-CM

## 2018-12-07 DIAGNOSIS — Z01818 Encounter for other preprocedural examination: Secondary | ICD-10-CM | POA: Diagnosis not present

## 2018-12-07 DIAGNOSIS — K920 Hematemesis: Secondary | ICD-10-CM | POA: Diagnosis not present

## 2018-12-07 DIAGNOSIS — K219 Gastro-esophageal reflux disease without esophagitis: Secondary | ICD-10-CM | POA: Diagnosis not present

## 2018-12-10 DIAGNOSIS — E291 Testicular hypofunction: Secondary | ICD-10-CM | POA: Diagnosis not present

## 2018-12-10 DIAGNOSIS — Z1321 Encounter for screening for nutritional disorder: Secondary | ICD-10-CM | POA: Diagnosis not present

## 2018-12-10 DIAGNOSIS — K920 Hematemesis: Secondary | ICD-10-CM | POA: Diagnosis not present

## 2018-12-10 DIAGNOSIS — Z1159 Encounter for screening for other viral diseases: Secondary | ICD-10-CM | POA: Diagnosis not present

## 2018-12-10 DIAGNOSIS — K219 Gastro-esophageal reflux disease without esophagitis: Secondary | ICD-10-CM | POA: Diagnosis not present

## 2018-12-13 ENCOUNTER — Encounter: Payer: Self-pay | Admitting: Family Medicine

## 2018-12-13 DIAGNOSIS — K219 Gastro-esophageal reflux disease without esophagitis: Secondary | ICD-10-CM | POA: Diagnosis not present

## 2018-12-19 ENCOUNTER — Emergency Department (HOSPITAL_BASED_OUTPATIENT_CLINIC_OR_DEPARTMENT_OTHER)
Admission: EM | Admit: 2018-12-19 | Discharge: 2018-12-20 | Disposition: A | Discharge status: Home / Self Care | Payer: No Typology Code available for payment source | Attending: Emergency Medicine | Admitting: Emergency Medicine

## 2018-12-19 ENCOUNTER — Emergency Department (HOSPITAL_BASED_OUTPATIENT_CLINIC_OR_DEPARTMENT_OTHER): Payer: No Typology Code available for payment source

## 2018-12-19 ENCOUNTER — Other Ambulatory Visit: Payer: Self-pay

## 2018-12-19 ENCOUNTER — Encounter (HOSPITAL_BASED_OUTPATIENT_CLINIC_OR_DEPARTMENT_OTHER): Payer: Self-pay

## 2018-12-19 DIAGNOSIS — Z888 Allergy status to other drugs, medicaments and biological substances status: Secondary | ICD-10-CM | POA: Insufficient documentation

## 2018-12-19 DIAGNOSIS — Z87891 Personal history of nicotine dependence: Secondary | ICD-10-CM | POA: Diagnosis not present

## 2018-12-19 DIAGNOSIS — I880 Nonspecific mesenteric lymphadenitis: Secondary | ICD-10-CM

## 2018-12-19 DIAGNOSIS — I10 Essential (primary) hypertension: Secondary | ICD-10-CM | POA: Insufficient documentation

## 2018-12-19 DIAGNOSIS — Z79899 Other long term (current) drug therapy: Secondary | ICD-10-CM | POA: Insufficient documentation

## 2018-12-19 DIAGNOSIS — I480 Paroxysmal atrial fibrillation: Secondary | ICD-10-CM | POA: Insufficient documentation

## 2018-12-19 DIAGNOSIS — Z8673 Personal history of transient ischemic attack (TIA), and cerebral infarction without residual deficits: Secondary | ICD-10-CM | POA: Insufficient documentation

## 2018-12-19 DIAGNOSIS — R1031 Right lower quadrant pain: Secondary | ICD-10-CM | POA: Diagnosis not present

## 2018-12-19 LAB — CBC WITH DIFFERENTIAL/PLATELET
Abs Immature Granulocytes: 0.02 10*3/uL (ref 0.00–0.07)
Basophils Absolute: 0.1 10*3/uL (ref 0.0–0.1)
Basophils Relative: 1 %
Eosinophils Absolute: 0.1 10*3/uL (ref 0.0–0.5)
Eosinophils Relative: 2 %
HCT: 50.3 % (ref 39.0–52.0)
Hemoglobin: 17 g/dL (ref 13.0–17.0)
Immature Granulocytes: 0 %
Lymphocytes Relative: 41 %
Lymphs Abs: 2.8 10*3/uL (ref 0.7–4.0)
MCH: 29.7 pg (ref 26.0–34.0)
MCHC: 33.8 g/dL (ref 30.0–36.0)
MCV: 87.9 fL (ref 80.0–100.0)
Monocytes Absolute: 0.7 10*3/uL (ref 0.1–1.0)
Monocytes Relative: 11 %
Neutro Abs: 3.1 10*3/uL (ref 1.7–7.7)
Neutrophils Relative %: 45 %
Platelets: 175 10*3/uL (ref 150–400)
RBC: 5.72 MIL/uL (ref 4.22–5.81)
RDW: 17.3 % — ABNORMAL HIGH (ref 11.5–15.5)
WBC: 6.9 10*3/uL (ref 4.0–10.5)
nRBC: 0 % (ref 0.0–0.2)

## 2018-12-19 LAB — COMPREHENSIVE METABOLIC PANEL
ALT: 35 U/L (ref 0–44)
AST: 19 U/L (ref 15–41)
Albumin: 3.8 g/dL (ref 3.5–5.0)
Alkaline Phosphatase: 68 U/L (ref 38–126)
Anion gap: 9 (ref 5–15)
BUN: 10 mg/dL (ref 6–20)
CO2: 23 mmol/L (ref 22–32)
Calcium: 8.6 mg/dL — ABNORMAL LOW (ref 8.9–10.3)
Chloride: 105 mmol/L (ref 98–111)
Creatinine, Ser: 0.91 mg/dL (ref 0.61–1.24)
GFR calc Af Amer: >60 mL/min (ref >60)
GFR calc non Af Amer: >60 mL/min (ref >60)
Glucose, Bld: 108 mg/dL — ABNORMAL HIGH (ref 70–99)
Potassium: 3.7 mmol/L (ref 3.5–5.1)
Sodium: 137 mmol/L (ref 135–145)
Total Bilirubin: 0.6 mg/dL (ref 0.3–1.2)
Total Protein: 7.2 g/dL (ref 6.5–8.1)

## 2018-12-19 LAB — URINALYSIS, ROUTINE W REFLEX MICROSCOPIC
Bilirubin Urine: NEGATIVE
Glucose, UA: NEGATIVE mg/dL
Hgb urine dipstick: NEGATIVE
Ketones, ur: 15 mg/dL — AB
Leukocytes,Ua: NEGATIVE
Nitrite: NEGATIVE
Protein, ur: NEGATIVE mg/dL
Specific Gravity, Urine: 1.025 (ref 1.005–1.030)
pH: 6.5 (ref 5.0–8.0)

## 2018-12-19 LAB — LIPASE, BLOOD: Lipase: 35 U/L (ref 11–51)

## 2018-12-19 MED ORDER — IOHEXOL 300 MG/ML  SOLN
100.0000 mL | Freq: Once | INTRAMUSCULAR | Status: AC | PRN
  Administered 2018-12-19: 100 mL via INTRAVENOUS

## 2018-12-19 MED ORDER — MORPHINE SULFATE (PF) 4 MG/ML IV SOLN
4.0000 mg | Freq: Once | INTRAVENOUS | Status: AC
  Administered 2018-12-19: 4 mg via INTRAVENOUS
  Filled 2018-12-19: qty 1

## 2018-12-19 MED ORDER — ONDANSETRON HCL 4 MG/2ML IJ SOLN
4.0000 mg | Freq: Once | INTRAMUSCULAR | Status: AC
  Administered 2018-12-19: 4 mg via INTRAVENOUS
  Filled 2018-12-19: qty 2

## 2018-12-19 MED ORDER — SODIUM CHLORIDE 0.9 % IV BOLUS
1000.0000 mL | Freq: Once | INTRAVENOUS | Status: AC
  Administered 2018-12-19: 1000 mL via INTRAVENOUS

## 2018-12-19 NOTE — ED Provider Notes (Signed)
[facility] DEPARTMENT Provider Note   CSN: [id number] Arrival date & time: 12/19/18  2039     History   Chief Complaint Chief Complaint  Patient presents with  . Abdominal Pain    HPI Scott Walter is a 48 y.o. male.     HPI   Scott Walter is a 48 y.o. male, with a history of paroxysmal atrial fibrillation, anticoagulation on Xarelto, anxiety, bipolar, HTN, presenting to the ED with abdominal pain beginning around 10 AM today. Pain is in the right lower quadrant, was originally a mild ache, has worsened to a 5/10 pain at rest, 9/10 with movement.  Occasionally radiating into the back.  Last bowel movement was around 5 PM today and was normal.  Last food was around 6 PM today.  Denies fever/chills, N/V/C/D, hematochezia/melena, urinary symptoms, genital pain, chest pain, or any other complaints.  Past Medical History:  Diagnosis Date  . Anxiety   . Bipolar disorder ([place])    h/o SI  . Chronic insomnia   . Chronic lower back pain   . Depression   . Dyslipidemia   . Essential hypertension   . ETOH abuse   . [id number])    "monthly" (01/26/2013)  . History of stomach ulcers ~ 2009  . Midsternal chest pain    a. 06/2014 Myoview: EF 50%, no ischemia/infarct.  . Migraine    "maybe 2 in my lifetime" (01/26/2013)  . PAF (paroxysmal atrial fibrillation) ([place])    a. Dx 05/2012;  b. 06/2014 Xarelto started in setting of ? TIA (CHA2DS2VASc = 3);  c. 06/2014 Echo: EF 60-65%, no rwma.  [name] PTSD (post-traumatic stress disorder)     Patient Active Problem List   Diagnosis Date Noted  . OSA (obstructive sleep apnea) 06/10/2017  . Preventative health care 09/06/2014  . Testicular discomfort 09/06/2014  . Low back pain 09/06/2014  . Dyslipidemia   . Chest pain 07/18/2014  . Visual disturbance 07/18/2014  . Paroxysmal atrial fibrillation ([place]) 07/18/2014  . Chest pain with moderate risk for cardiac etiology 07/18/2014  . Amaurosis fugax of right eye 07/18/2014   . TIA (transient ischemic attack)   . Shortness of breath 03/02/2013  . Chronic insomnia 03/02/2013  . Bipolar disorder ([place]) 03/02/2013  . Precordial pain 01/26/2013  . Hyperglycemia 01/26/2013  . Essential hypertension 06/12/2012  . ETOH abuse 06/12/2012    Past Surgical History:  Procedure Laterality Date  . KNEE ARTHROSCOPY Right 09/2011  . KNEE SURGERY  02/2017   right knee  . SHOULDER SURGERY    . VASECTOMY  09/2017        Home Medications    Prior to Admission medications   Medication Sig Start Date End Date Taking? Authorizing Provider  albuterol (PROVENTIL HFA;VENTOLIN HFA) 108 (90 Base) MCG/ACT inhaler Inhale 2 puffs into the lungs every 6 (six) hours as needed for wheezing or shortness of breath. 07/21/16   Fay Records, MD  amLODipine (NORVASC) 2.5 MG tablet Take 1 tablet (2.5 mg total) by mouth daily. 03/01/18   Fay Records, MD  Artificial Tear Ointment (DRY EYES OP) Apply to eye.    [provider]  atenolol (TENORMIN) 25 MG tablet Take 1 tablet (25 mg total) by mouth daily. 03/23/17   Fay Records, MD  cetirizine (ZYRTEC) 10 MG tablet Take 10 mg by mouth daily.    [provider]  cyclobenzaprine (FLEXERIL) 10 MG tablet Take 1 tablet (10 mg total) by mouth 2 (two) times  daily as needed for muscle spasms. 09/17/18   Albrizze, Kaitlyn E, PA-C  ergocalciferol (VITAMIN D2) 1.25 MG (50000 UT) capsule Take 50,000 Units by mouth once a week.  03/18/18   [provider]  flecainide (TAMBOCOR) 50 MG tablet Take 1.5 tablets (75 mg total) by mouth 2 (two) times daily. 01/13/18   Fay Records, MD  fluticasone (FLONASE) 50 MCG/ACT nasal spray Place 1 spray into both nostrils daily. 04/29/17   Petrucelli, Glynda Jaeger, PA-C  Melatonin 10 MG CAPS Take 3 capsules by mouth at bedtime as needed.    [provider]  omeprazole (PRILOSEC) 20 MG capsule Take 1 capsule (20 mg total) by mouth 2 (two) times daily before a meal. 06/04/17   Fay Records, MD   OVER THE COUNTER MEDICATION [employer] Apple Cider Vinegar    [provider]  sertraline (ZOLOFT) 100 MG tablet Take 50 mg by mouth daily. 11/20/18   [provider]  testosterone cypionate (DEPOTESTOTERONE CYPIONATE) 100 MG/ML injection Inject 200 mg into the muscle every 14 (fourteen) days. For IM use only    [provider]  triamcinolone cream (KENALOG) 0.1 % Apply 1 application topically 2 (two) times daily as needed. 07/28/16   Burchette, Alinda Sierras, MD  XARELTO 20 MG TABS tablet TAKE 1 TABLET BY MOUTH EVERY DAY WITH SUPPER 12/30/17   Fay Records, MD    Family History Family History  Problem Relation Age of Onset  . CAD Mother   . Diabetes Mother   . Emphysema Father   . Healthy Sister   . Healthy Brother   . Healthy Sister   . Healthy Sister   . Healthy Sister   . Healthy Sister   . Healthy Sister   . Healthy Sister   . Healthy Sister   . Healthy Brother   . Healthy Brother   . Healthy Brother   . HIV Brother     Social History Social History   Tobacco Use  . Smoking status: Former Smoker    Packs/day: 0.75    Years: 30.00    Pack years: 22.50    Types: Cigarettes    Quit date: 03/07/2017    Years since quitting: 1.7  . Smokeless tobacco: Former Systems developer    Types: Chew  . Tobacco comment: 01/26/2013 "quit chewing > 20 yr ago"  Substance Use Topics  . Alcohol use: Yes    Comment: occ  . Drug use: No     Allergies   Ambien [zolpidem tartrate], Divalproex sodium, Valproic acid, and Zolpidem   Review of Systems Review of Systems  Constitutional: Negative for chills, diaphoresis and fever.  Respiratory: Negative for cough and shortness of breath.   Cardiovascular: Negative for chest pain.  Gastrointestinal: Positive for abdominal pain. Negative for blood in stool, constipation, diarrhea, nausea and vomiting.  Genitourinary: Negative for dysuria, flank pain, hematuria, scrotal swelling and testicular pain.  Neurological: Negative for  dizziness, syncope and weakness.  All other systems reviewed and are negative.    Physical Exam Updated Vital Signs BP (!) 160/105 (BP Location: Left Arm)   Pulse 75   Resp 18   Ht 5\' 9"  (1.753 m)   Wt 120.2 kg   SpO2 99%   BMI 39.13 kg/m   Physical Exam Vitals signs and nursing note reviewed.  Constitutional:      General: He is not in acute distress.    Appearance: He is well-developed. He is not diaphoretic.  HENT:  Head: Normocephalic and atraumatic.     Mouth/Throat:     Mouth: Mucous membranes are moist.     Pharynx: Oropharynx is clear.  Eyes:     Conjunctiva/sclera: Conjunctivae normal.  Neck:     Musculoskeletal: Neck supple.  Cardiovascular:     Rate and Rhythm: Normal rate and regular rhythm.     Pulses: Normal pulses.          Radial pulses are 2+ on the right side and 2+ on the left side.       Posterior tibial pulses are 2+ on the right side and 2+ on the left side.     Heart sounds: Normal heart sounds.     Comments: Tactile temperature in the extremities appropriate and equal bilaterally. Pulmonary:     Effort: Pulmonary effort is normal. No respiratory distress.     Breath sounds: Normal breath sounds.  Abdominal:     General: Bowel sounds are normal.     Palpations: Abdomen is soft.     Tenderness: There is abdominal tenderness in the right lower quadrant. There is no right CVA tenderness, left CVA tenderness or guarding.    Musculoskeletal:     Right lower leg: No edema.     Left lower leg: No edema.  Lymphadenopathy:     Cervical: No cervical adenopathy.  Skin:    General: Skin is warm and dry.  Neurological:     Mental Status: He is alert.  Psychiatric:        Mood and Affect: Mood and affect normal.        Speech: Speech normal.        Behavior: Behavior normal.      ED Treatments / Results  Labs (all labs ordered are listed, but only abnormal results are displayed) Labs Reviewed  LIPASE, BLOOD  COMPREHENSIVE METABOLIC PANEL   URINALYSIS, ROUTINE W REFLEX MICROSCOPIC  CBC WITH DIFFERENTIAL/PLATELET    EKG None  Radiology No results found.  Procedures Procedures (including critical care time)  Medications Ordered in ED Medications  sodium chloride 0.9 % bolus 1,000 mL (has no administration in time range)  morphine 4 MG/ML injection 4 mg (has no administration in time range)  ondansetron (ZOFRAN) injection 4 mg (has no administration in time range)     Initial Impression / Assessment and Plan / ED Course  I have reviewed the triage vital signs and the nursing notes.  Pertinent labs & imaging results that were available during my care of the patient were reviewed by me and considered in my medical decision making (see chart for details).        At the end of my shift, Dr. Dina Rich, [place], continued care for this patient.  Plan: Labs and CT pending.   Final Clinical Impressions(s) / ED Diagnoses   Final diagnoses:  None    ED Discharge Orders    None       Layla Maw 12/20/18 0025    Merryl Hacker, MD 12/22/18 [phone]

## 2018-12-19 NOTE — ED Triage Notes (Signed)
Pt c/o RLQ pain since 1000 today. Pt denies N/V/d. Pt denies fever. Pt ambulatory

## 2018-12-20 ENCOUNTER — Other Ambulatory Visit: Payer: Self-pay | Admitting: Internal Medicine

## 2018-12-20 DIAGNOSIS — R1031 Right lower quadrant pain: Secondary | ICD-10-CM | POA: Diagnosis not present

## 2018-12-20 MED ORDER — OXYCODONE-ACETAMINOPHEN 5-325 MG PO TABS
1.0000 | ORAL_TABLET | Freq: Once | ORAL | Status: AC
  Administered 2018-12-20: 1 via ORAL
  Filled 2018-12-20: qty 1

## 2018-12-20 NOTE — Discharge Instructions (Addendum)
You were seen today for right lower quadrant pain.  Your CT scan shows some inflammation of the lymph nodes in your abdomen consistent with mesenteric adenitis.  This likely will resolve on its own.  Take Tylenol as needed for pain.

## 2018-12-22 ENCOUNTER — Telehealth: Payer: Self-pay | Admitting: Internal Medicine

## 2018-12-22 NOTE — Telephone Encounter (Signed)
New Message     Pts wife is calling because the pt has an appt scheduled 11/5 with Daune Perch and is wondering if he needs to keep this appt because the pt is still wearing his monitor  She says he has not worn it the full 14 days    Please call back

## 2018-12-23 ENCOUNTER — Ambulatory Visit: Payer: Federal, State, Local not specified - PPO | Admitting: Cardiology

## 2018-12-23 NOTE — Telephone Encounter (Signed)
Pt called into office and rescheduled appointment with Gae Bon in 12/23/2018

## 2019-01-17 ENCOUNTER — Telehealth: Payer: Self-pay

## 2019-01-17 NOTE — Telephone Encounter (Signed)
-----   Message from Dorris Carnes V, MD sent at 01/11/2019  4:39 PM EST ----- No atrial fibrillation detected Rhythm was normal sinus rhythm with rare skip Keep on same meds

## 2019-01-17 NOTE — Telephone Encounter (Signed)
[email or web address]

## 2019-01-22 NOTE — Progress Notes (Signed)
Cardiology Office Note   Date:  01/24/2019   ID:  Scott Walter, DOB 27-Jun-1970, MRN [id number]  PCP:  Eulas Post, MD  Cardiologist:   Dorris Carnes, MD    Follow-up of PAF and hypertension   History of Present Illness: Scott Walter is a 48 y.o. male with a history ofPAF, HTN, ? TIA, bipolar disorder     I last saw him in 2019.  Most recently he was seen in clinic in October by Pecolia Ades The patient was seen last in cardiology clinic in October 2020 by Pecolia Ades.  He complained of some dyspnea on exertion that had worsened.  He also had some pressure in his chest.  Does have a history of reflux. The patient went on to have a [place] which was normal.  He also had an event monitor placed which showed no atrial fibrillation.  Patient said he was actually doing better on those days.  The patient denies palpitations.  He did say he is having significant reflux and he is wondering if some of his symptoms are related to this his weight is up he has not been able to lose weight.    Current Meds  Medication Sig  . albuterol (PROVENTIL HFA;VENTOLIN HFA) 108 (90 Base) MCG/ACT inhaler Inhale 2 puffs into the lungs every 6 (six) hours as needed for wheezing or shortness of breath.  [name] amLODipine (NORVASC) 2.5 MG tablet Take 1 tablet (2.5 mg total) by mouth daily.  . Artificial Tear Ointment (DRY EYES OP) Apply to eye.  [name] atenolol (TENORMIN) 25 MG tablet Take 1 tablet (25 mg total) by mouth daily.  . cetirizine (ZYRTEC) 10 MG tablet Take 10 mg by mouth daily.  . cyclobenzaprine (FLEXERIL) 10 MG tablet Take 1 tablet (10 mg total) by mouth 2 (two) times daily as needed for muscle spasms.  . ergocalciferol (VITAMIN D2) 1.25 MG (50000 UT) capsule Take 50,000 Units by mouth once a week.   . flecainide (TAMBOCOR) 50 MG tablet TAKE 1.5 TABLETS (75 MG TOTAL) BY MOUTH 2 (TWO) TIMES DAILY.  . fluticasone (FLONASE) 50 MCG/ACT nasal spray Place 1 spray into both nostrils daily.  .  Melatonin 10 MG CAPS Take 3 capsules by mouth at bedtime as needed.  [name] omeprazole (PRILOSEC) 20 MG capsule Take 1 capsule (20 mg total) by mouth 2 (two) times daily before a meal.  . OVER THE COUNTER MEDICATION [employer] Gaffer  . sertraline (ZOLOFT) 100 MG tablet Take 50 mg by mouth daily.  [name] testosterone cypionate (DEPOTESTOTERONE CYPIONATE) 100 MG/ML injection Inject 200 mg into the muscle every 14 (fourteen) days. For IM use only  . triamcinolone cream (KENALOG) 0.1 % Apply 1 application topically 2 (two) times daily as needed.  Alveda Reasons 20 MG TABS tablet TAKE 1 TABLET BY MOUTH EVERY DAY WITH SUPPER     Allergies:   Ambien [zolpidem tartrate], Divalproex sodium, Valproic acid, and Zolpidem   Past Medical History:  Diagnosis Date  . Anxiety   . Bipolar disorder ([place])    h/o SI  . Chronic insomnia   . Chronic lower back pain   . Depression   . Dyslipidemia   . Essential hypertension   . ETOH abuse   . [id number])    "monthly" (01/26/2013)  . History of stomach ulcers ~ 2009  . Midsternal chest pain    a. 06/2014 Myoview: EF 50%, no ischemia/infarct.  . Migraine    "maybe 2 in my lifetime" (  01/26/2013)  . PAF (paroxysmal atrial fibrillation) ([place])    a. Dx 05/2012;  b. 06/2014 Xarelto started in setting of ? TIA (CHA2DS2VASc = 3);  c. 06/2014 Echo: EF 60-65%, no rwma.  [name] PTSD (post-traumatic stress disorder)     Past Surgical History:  Procedure Laterality Date  . KNEE ARTHROSCOPY Right 09/2011  . KNEE SURGERY  02/2017   right knee  . SHOULDER SURGERY    . VASECTOMY  09/2017     Social History:  The patient  reports that he quit smoking about 22 months ago. His smoking use included cigarettes. He has a 22.50 pack-year smoking history. He has quit using smokeless tobacco.  His smokeless tobacco use included chew. He reports current alcohol use. He reports that he does not use drugs.   Family History:  The patient's family history includes CAD in his  mother; Diabetes in his mother; Emphysema in his father; HIV in his brother; Healthy in his brother, brother, brother, brother, sister, sister, sister, sister, sister, sister, sister, and sister.    ROS:  Please see the history of present illness. All other systems are reviewed and  Negative to the above problem except as noted.    PHYSICAL EXAM: VS:  BP 134/86   Pulse 65   Ht 5\' 9"  (1.753 m)   Wt 276 lb (125.2 kg)   SpO2 97%   BMI 40.76 kg/m   GEN: Morbidly obese 48 year old, in no acute distress  HEENT: normal  Neck: no JVD, Cardiac: RRR; no murmurs, rubs, or gallops,no edema  Respiratory:  clear to auscultation bilaterally, normal work of breathing GI: soft, nontender, nondistended, + BS  No hepatomegaly  MS: no deformity Moving all extremities   Skin: warm and dry, no rash Neuro:  Strength and sensation are intact Psych: euthymic mood, full affect   EKG:  EKG is not ordered today.   Lipid Panel    Component Value Date/Time   CHOL 180 03/12/2018   TRIG 134 03/12/2018   HDL 41.10 07/28/2016 1546   CHOLHDL 4 07/28/2016 1546   VLDL 37.4 07/28/2016 1546   LDLCALC 105 (H) 07/28/2016 1546      Wt Readings from Last 3 Encounters:  01/24/19 276 lb (125.2 kg)  12/19/18 265 lb (120.2 kg)  12/02/18 278 lb (126.1 kg)      ASSESSMENT AND PLAN:  1.  PAF keep on current regimen recent monitor shows no atrial fibrillation.  2.  Hypertension blood pressure is fair.  I would continue.  Discussed weight loss (he he is drinking quite a bit of sweetened beverages).  3 GI.  Patient does have a history of reflux he is on Prilosec.  I would continue.  Encouraged weight loss.  Encourage the patient to stay active.  He does have joint issues but has been able to tolerate exercise at home.  Again diet discussed.  We will set to see the patient back next summer, sooner with problems.     Current medicines are reviewed at length with the patient today.  The patient does not have  concerns regarding medicines.  Signed, Dorris Carnes, MD  01/24/2019 5:14 PM    [facility] [place], [place], [place]  [address] Phone: [phone]; Fax: [phone]

## 2019-01-24 ENCOUNTER — Encounter: Payer: Self-pay | Admitting: Internal Medicine

## 2019-01-24 ENCOUNTER — Other Ambulatory Visit: Payer: Self-pay

## 2019-01-24 ENCOUNTER — Ambulatory Visit (INDEPENDENT_AMBULATORY_CARE_PROVIDER_SITE_OTHER): Payer: Federal, State, Local not specified - PPO | Admitting: Internal Medicine

## 2019-01-24 VITALS — BP 134/86 | HR 65 | Ht 69.0 in | Wt 276.0 lb

## 2019-01-24 DIAGNOSIS — I10 Essential (primary) hypertension: Secondary | ICD-10-CM | POA: Diagnosis not present

## 2019-01-24 DIAGNOSIS — K219 Gastro-esophageal reflux disease without esophagitis: Secondary | ICD-10-CM

## 2019-01-24 DIAGNOSIS — I480 Paroxysmal atrial fibrillation: Secondary | ICD-10-CM

## 2019-01-24 NOTE — Patient Instructions (Signed)
Medication Instructions:  No changes *If you need a refill on your cardiac medications before your next appointment, please call your pharmacy*  Lab Work: none If you have labs (blood work) drawn today and your tests are completely normal, you will receive your results only by: [name] MyChart Message (if you have MyChart) OR . A paper copy in the mail If you have any lab test that is abnormal or we need to change your treatment, we will call you to review the results.  Testing/Procedures: none  Follow-Up: At [facility], you and your health needs are our priority.  As part of our continuing mission to provide you with exceptional heart care, we have created designated Provider Care Teams.  These Care Teams include your primary Cardiologist (physician) and Advanced Practice Providers (APPs -  Physician Assistants and Nurse Practitioners) who all work together to provide you with the care you need, when you need it.  Your next appointment:   5 month(s)  The format for your next appointment:   In Person  Provider:   You may see Dorris Carnes, MD or one of the following Advanced Practice Providers on your designated Care Team:    Richardson Dopp, PA-C  [place], [place]  Daune Perch, NP   Other Instructions

## 2019-02-20 ENCOUNTER — Other Ambulatory Visit: Payer: Self-pay | Admitting: Internal Medicine

## 2019-02-23 ENCOUNTER — Ambulatory Visit (INDEPENDENT_AMBULATORY_CARE_PROVIDER_SITE_OTHER): Payer: Federal, State, Local not specified - PPO | Admitting: Family Medicine

## 2019-02-23 ENCOUNTER — Encounter: Payer: Self-pay | Admitting: Family Medicine

## 2019-02-23 ENCOUNTER — Other Ambulatory Visit: Payer: Self-pay

## 2019-02-23 VITALS — BP 138/84 | HR 82 | Temp 97.8°F | Ht 69.0 in | Wt 272.7 lb

## 2019-02-23 DIAGNOSIS — K6289 Other specified diseases of anus and rectum: Secondary | ICD-10-CM

## 2019-02-23 NOTE — Patient Instructions (Signed)
I am setting up GI referral for further evaluation  Let me know if they have not contacted you in the next week.

## 2019-02-23 NOTE — Progress Notes (Signed)
Subjective:     Patient ID: Scott Walter, male   DOB: 05-19-1970, 49 y.o.   MRN: [id number]  HPI    [name] is seen with recently noted minimally painful "lump "in his rectal region on the left side.  He states that Sunday he was having a bowel movement noticed some slight pain.  He did not notice any recent blood with bowel movements and no change in bowel habits.  No appetite or weight changes.  He did internal exam himself and noted a small lump in the left rectal region.  He has never had colonoscopy previously.  Recent CBC unremarkable with no anemia.  No family history of colon cancer.  His discomfort is relatively mild.  No recent fevers or chills.  He does have history of atrial fibrillation and is maintained on Xarelto.  No recent cardiac symptoms  Past Medical History:  Diagnosis Date  . Anxiety   . Bipolar disorder ([place])    h/o SI  . Chronic insomnia   . Chronic lower back pain   . Depression   . Dyslipidemia   . Essential hypertension   . ETOH abuse   . [id number])    "monthly" (01/26/2013)  . History of stomach ulcers ~ 2009  . Midsternal chest pain    a. 06/2014 Myoview: EF 50%, no ischemia/infarct.  . Migraine    "maybe 2 in my lifetime" (01/26/2013)  . PAF (paroxysmal atrial fibrillation) ([place])    a. Dx 05/2012;  b. 06/2014 Xarelto started in setting of ? TIA (CHA2DS2VASc = 3);  c. 06/2014 Echo: EF 60-65%, no rwma.  [name] PTSD (post-traumatic stress disorder)    Past Surgical History:  Procedure Laterality Date  . KNEE ARTHROSCOPY Right 09/2011  . KNEE SURGERY  02/2017   right knee  . SHOULDER SURGERY    . VASECTOMY  09/2017    reports that he quit smoking about 1 years ago. His smoking use included cigarettes. He has a 22.50 pack-year smoking history. He has quit using smokeless tobacco.  His smokeless tobacco use included chew. He reports current alcohol use. He reports that he does not use drugs. family history includes CAD in his mother; Diabetes in his mother;  Emphysema in his father; HIV in his brother; Healthy in his brother, brother, brother, brother, sister, sister, sister, sister, sister, sister, sister, and sister. Allergies  Allergen Reactions  . Ambien [Zolpidem Tartrate] Other (See Comments)    "Nightmares and mood swings  . Divalproex Sodium Anaphylaxis, Shortness Of Breath and Swelling  . Valproic Acid Anaphylaxis and Swelling  . Zolpidem Anaphylaxis     Review of Systems  Constitutional: Negative for chills and fever.  Respiratory: Negative for shortness of breath.   Cardiovascular: Negative for chest pain.  Gastrointestinal: Positive for rectal pain. Negative for abdominal pain, blood in stool, constipation, diarrhea, nausea and vomiting.       Objective:   Physical Exam Vitals reviewed.  Constitutional:      Appearance: Normal appearance.  Cardiovascular:     Rate and Rhythm: Normal rate.  Pulmonary:     Effort: Pulmonary effort is normal.     Breath sounds: Normal breath sounds.  Genitourinary:    Comments: He has fairly large skin tag left side of anus but this is nontender.  No evidence for any thrombosed hemorrhoids.  No anal fissure.  Digital exam reveals fairly firm nodular density about 1 cm diameter left side of rectum.  Minimally tender.  Nonfluctuant.  He does not  have any surrounding erythema or fluctuance around the anal area Neurological:     Mental Status: He is alert.        Assessment:     Left-sided rectal mass.  He does not have any anal fissure, thrombosed hemorrhoid, or evidence for perianal or perirectal abscess.  Nodular type density as above which seems to have fairly broad base.    Plan:     -We placed referral to GI for further evaluation and placed this as an urgent referral -Answered patient's questions.  We have recommended that he notify us within 1 week if not hearing from GI office by then  Eulas Post MD [facility]

## 2019-02-25 ENCOUNTER — Encounter: Payer: Self-pay | Admitting: Gastroenterology

## 2019-02-25 ENCOUNTER — Ambulatory Visit: Payer: Federal, State, Local not specified - PPO | Admitting: Gastroenterology

## 2019-02-25 ENCOUNTER — Other Ambulatory Visit: Payer: Self-pay

## 2019-02-25 ENCOUNTER — Telehealth: Payer: Self-pay | Admitting: Emergency Medicine

## 2019-02-25 VITALS — BP 158/100 | HR 68 | Temp 97.4°F | Ht 69.0 in | Wt 276.4 lb

## 2019-02-25 DIAGNOSIS — Z01818 Encounter for other preprocedural examination: Secondary | ICD-10-CM

## 2019-02-25 DIAGNOSIS — R6889 Other general symptoms and signs: Secondary | ICD-10-CM | POA: Diagnosis not present

## 2019-02-25 MED ORDER — NA SULFATE-K SULFATE-MG SULF 17.5-3.13-1.6 GM/177ML PO SOLN: 1.0000 | ORAL | 0 refills | Status: AC

## 2019-02-25 NOTE — Telephone Encounter (Signed)
Patient with diagnosis of afib on Xarelto for anticoagulation.    Procedure: colonoscopy Date of procedure: 03/03/2019  CHADS2-VASc score of  3 (HTN, stroke/tia x 2)  CrCl 129 ml/min  Due to patient history of a TIA, recommend patient hold Xarelto only one day prior to procedure.

## 2019-02-25 NOTE — Patient Instructions (Addendum)
You have been scheduled for a colonoscopy. Please follow written instructions given to you at your visit today.  Please pick up your prep supplies at the pharmacy within the next 1-3 days. If you use inhalers (even only as needed), please bring them with you on the day of your procedure.  I value your feedback and thank you for entrusting [place] with your care. If you get a [facility] patient survey, I would appreciate you taking the time to let us know about your experience today. Thank you!   Due to recent changes in healthcare laws, you may see the results of your imaging and laboratory studies on MyChart before your provider has had a chance to review them.  We understand that in some cases there may be results that are confusing or concerning to you. Not all laboratory results come back in the same time frame and the provider may be waiting for multiple results in order to interpret others.  Please give [place] 48 hours in order for your provider to thoroughly review all the results before contacting the office for clarification of your results.    Thank you for trusting me with your gastrointestinal care!    Thornton Park, MD, MPH

## 2019-02-25 NOTE — Progress Notes (Signed)
Referring Provider: Eulas Post, MD Primary Care Physician:  Eulas Post, MD  Reason for Consultation: Rectal lump  IMPRESSION:  Abnormal rectal exam  Rectal pain x 5 days Mesenteric adenitis 11/20 No prior colonoscopy Atrial fibrillation on Xarelto ([place])  Abnormal rectal exam by Dr. Elease Hashimoto is worrisome for a polyp or mass. Colonoscopy recommended after a Xarelto washout. I discussed with the patient and his wife that there is a low, but real, risk of a cardiovascular event such as heart attack, stroke, or embolism/thrombosis while off Xarelto. Will communicate by EMR with Dr. [name] to confirm that holding the Xarelto is appropriate at this time.   Recent diagnosis of mesenteric adenitis is likely due to viral infection given his acute symptoms.  However, inflammatory bowel disease and lymphoma must be considered. Particularly given his abnormal rectal exam. Depending on his colonoscopy, he may need a follow-up contrasted CT scan.   PLAN: Colonoscopy after a Xarelto washout   The nature of the procedure, as well as the risks, benefits, and alternatives were carefully and thoroughly reviewed with the patient. Ample time for discussion and questions allowed. The patient understood, was satisfied, and agreed to proceed.  Please see the "Patient Instructions" section for addition details about the plan.  HPI: Scott Walter is a 49 y.o. male referred by Dr. Elease Hashimoto for further evaluation of a rectal mass.  The history is obtained through the patient and review of his electronic health record. Former Geophysicist/field seismologist for [employer].  His wife accompanies him to this appointment.  He has atrial fibrillation is on Xarelto, HTN, ? TIA, bipolar disorder.  The patient felt a small lump in the left rectal region when he was having a bowel movement 5 days ago. No pain with sitting. Painful to the touch. Size of a dime. Unchanged over the last 5 days.  Pain increases with defecation.  Scant blood  x 1 No mucus Having four BM daily. Baseline is 2 BM daily. Normal appetite.  Weight stable. No systemic symptoms No prior history of rectal pain  No prior colonoscopy.  Seen by Dr. Sheldon Silvan earlier this week. His rectal exam notes: "He has fairly large skin tag left side of anus but this is nontender.  No evidence for any thrombosed hemorrhoids.  No anal fissure.  Digital exam reveals fairly firm nodular density about 1 cm diameter left side of rectum.  Minimally tender.  Nonfluctuant.  He does not have any surrounding erythema or fluctuance around the anal area."  Seen in the ED 12/19/18 for non-radiating, sharp LLQ abdominal pain that lasted 3-4 days. The patient was concerned that he had appendicitis. A CT of the abdomen and pelvis with contrast 12/19/2018 to evaluate for right lower quadrant pain that showed mildly prominent right lower quadrant mesenteric lymph nodes that could reflect mesenteric adenitis and suspected early fatty infiltration of the liver.  The pain resolved without intervention in 3-4 days.   This past week he had recurrence of the same abdominal pain but it was fleeting. Pain only occurred with movement. No change with change with eating or defecation.   Labs 12/19/2018 showing normal comprehensive metabolic panel except for a glucose of 108.  He had a normal CBC except for an RDW of 17.3.  In particular liver enzymes were normal.  Lipase was 35.  Hemoglobin 17.0.  MCV 87.9.  Maternal grandfather with colon cancer and prostate cancer at an unknown age. Sister with uterine cancer at age 80. No other known  family history of colon cancer or polyps. No family history of uterine/endometrial cancer, pancreatic cancer or gastric/stomach cancer.   Past Medical History:  Diagnosis Date  . Anxiety   . Bipolar disorder ([place])    h/o SI  . Chronic insomnia   . Chronic lower back pain   . Depression   . Dyslipidemia   . Essential hypertension   . ETOH abuse   .  [id number])    "monthly" (01/26/2013)  . History of stomach ulcers ~ 2009  . Midsternal chest pain    a. 06/2014 Myoview: EF 50%, no ischemia/infarct.  . Migraine    "maybe 2 in my lifetime" (01/26/2013)  . PAF (paroxysmal atrial fibrillation) ([place])    a. Dx 05/2012;  b. 06/2014 Xarelto started in setting of ? TIA (CHA2DS2VASc = 3);  c. 06/2014 Echo: EF 60-65%, no rwma.  [name] PTSD (post-traumatic stress disorder)     Past Surgical History:  Procedure Laterality Date  . KNEE ARTHROSCOPY Right 09/2011  . KNEE SURGERY  02/2017   right knee  . SHOULDER SURGERY    . VASECTOMY  09/2017    Current Outpatient Medications  Medication Sig Dispense Refill  . albuterol (PROVENTIL HFA;VENTOLIN HFA) 108 (90 Base) MCG/ACT inhaler Inhale 2 puffs into the lungs every 6 (six) hours as needed for wheezing or shortness of breath. 1 Inhaler 2  . amLODipine (NORVASC) 2.5 MG tablet TAKE 1 TABLET BY MOUTH EVERY DAY 90 tablet 3  . Artificial Tear Ointment (DRY EYES OP) Apply to eye.    [name] atenolol (TENORMIN) 25 MG tablet Take 1 tablet (25 mg total) by mouth daily. 90 tablet 3  . buPROPion (WELLBUTRIN XL) 150 MG 24 hr tablet     . cetirizine (ZYRTEC) 10 MG tablet Take 10 mg by mouth daily.    . cyclobenzaprine (FLEXERIL) 10 MG tablet Take 1 tablet (10 mg total) by mouth 2 (two) times daily as needed for muscle spasms. 8 tablet 0  . Diclofenac Sodium (PENNSAID) 2 % SOLN Pennsaid 20 mg/gram/actuation (2 %) topical soln in metered-dose pump  APPLY 2 PUMPS (40 MG) TO THE AFFECTED AREA BY TOPICAL ROUTE 2 TIMES PER DAY    . ergocalciferol (VITAMIN D2) 1.25 MG (50000 UT) capsule Take 50,000 Units by mouth once a week.     . flecainide (TAMBOCOR) 50 MG tablet TAKE 1.5 TABLETS (75 MG TOTAL) BY MOUTH 2 (TWO) TIMES DAILY. 270 tablet 0  . fluticasone (FLONASE) 50 MCG/ACT nasal spray Place 1 spray into both nostrils daily. 16 g 2  . Melatonin 10 MG CAPS Take 3 capsules by mouth at bedtime as needed.    [name] omeprazole  (PRILOSEC) 20 MG capsule Take 1 capsule (20 mg total) by mouth 2 (two) times daily before a meal. 90 capsule 3  . OVER THE COUNTER MEDICATION [employer] Gaffer    . sertraline (ZOLOFT) 100 MG tablet Take 50 mg by mouth daily.    [name] testosterone cypionate (DEPOTESTOTERONE CYPIONATE) 100 MG/ML injection Inject 200 mg into the muscle every 14 (fourteen) days. For IM use only    . triamcinolone cream (KENALOG) 0.1 % Apply 1 application topically 2 (two) times daily as needed. 80 g 2  . XARELTO 20 MG TABS tablet TAKE 1 TABLET BY MOUTH EVERY DAY WITH SUPPER 90 tablet 1   No current facility-administered medications for this visit.    Allergies as of 02/25/2019 - Review Complete 02/23/2019  Allergen Reaction Noted  . Ambien [zolpidem  tartrate] Other (See Comments) 01/27/2013  . Divalproex sodium Anaphylaxis, Shortness Of Breath, and Swelling 06/11/2012  . Valproic acid Anaphylaxis and Swelling 04/06/2013  . Zolpidem Anaphylaxis 03/13/2014    Family History  Problem Relation Age of Onset  . CAD Mother   . Diabetes Mother   . Emphysema Father   . Healthy Sister   . Healthy Brother   . Healthy Sister   . Healthy Sister   . Healthy Sister   . Healthy Sister   . Healthy Sister   . Healthy Sister   . Healthy Sister   . Healthy Brother   . Healthy Brother   . Healthy Brother   . HIV Brother     Social History   Socioeconomic History  . Marital status: Married    Spouse name: Not on file  . Number of children: Not on file  . Years of education: Not on file  . Highest education level: Not on file  Occupational History  . Not on file  Tobacco Use  . Smoking status: Former Smoker    Packs/day: 0.75    Years: 30.00    Pack years: 22.50    Types: Cigarettes    Quit date: 03/07/2017    Years since quitting: 1.9  . Smokeless tobacco: Former Systems developer    Types: Chew  . Tobacco comment: 01/26/2013 "quit chewing > 20 yr ago"  Substance and Sexual Activity  . Alcohol use: Yes     Comment: occ  . Drug use: No  . Sexual activity: Not on file  Other Topics Concern  . Not on file  Social History Narrative  . Not on file   Social Determinants of Health   Financial Resource Strain:   . Difficulty of Paying Living Expenses: Not on file  Food Insecurity:   . Worried About Charity fundraiser in the Last Year: Not on file  . Ran Out of Food in the Last Year: Not on file  Transportation Needs:   . Lack of Transportation (Medical): Not on file  . Lack of Transportation (Non-Medical): Not on file  Physical Activity:   . Days of Exercise per Week: Not on file  . Minutes of Exercise per Session: Not on file  Stress:   . Feeling of Stress : Not on file  Social Connections:   . Frequency of Communication with Friends and Family: Not on file  . Frequency of Social Gatherings with Friends and Family: Not on file  . Attends Religious Services: Not on file  . Active Member of Clubs or Organizations: Not on file  . Attends Archivist Meetings: Not on file  . Marital Status: Not on file  Intimate Partner Violence:   . Fear of Current or Ex-Partner: Not on file  . Emotionally Abused: Not on file  . Physically Abused: Not on file  . Sexually Abused: Not on file    Review of Systems: 12 system ROS is negative except as noted above.   Physical Exam: General:   Alert,  well-nourished, pleasant and cooperative in NAD Head:  Normocephalic and atraumatic. Eyes:  Sclera clear, no icterus.   Conjunctiva pink. Ears:  Normal auditory acuity. Nose:  No deformity, discharge,  or lesions. Mouth:  No deformity or lesions.   Neck:  Supple; no masses or thyromegaly. Lungs:  Clear throughout to auscultation.   No wheezes. Heart:  Regular rate and rhythm; no murmurs. Abdomen:  Soft, nontender, nondistended, normal bowel sounds, no rebound or  guarding. No hepatosplenomegaly.   Rectal:  Deferred to colonoscopy.  Msk:  Symmetrical. No boney deformities LAD: No inguinal  or umbilical LAD Extremities:  No clubbing or edema. Neurologic:  Alert and  oriented x4;  grossly nonfocal Skin:  Intact without significant lesions or rashes. Psych:  Alert and cooperative. Normal mood and affect.    Valina Maes L. Tarri Glenn, MD, MPH 02/25/2019, 1:01 PM

## 2019-02-25 NOTE — Telephone Encounter (Signed)
   Primary Cardiologist: Dorris Carnes, MD  Chart reviewed as part of pre-operative protocol coverage. Per pharmacy recommendations, patient can hold xarelto 1 day prior to his upcoming colonoscopy. Xarelto should be restarted as soon as he is cleared to do so by his gastroenterologist.    I will route this recommendation to the requesting party via [place] fax function and remove from pre-op pool.  Please call with questions.  Abigail Butts, PA-C 02/25/2019, 4:44 PM

## 2019-02-25 NOTE — Telephone Encounter (Signed)
[facility] Pre-operative Risk Assessment     Request for surgical clearance:     Endoscopy Procedure  What type of surgery is being performed?     colonoscopy  When is this surgery scheduled?     03-03-19  What type of clearance is required ?   Pharmacy  Are there any medications that need to be held prior to surgery and how long? Xarelto 2 days  Practice name and name of physician performing surgery?      [facility]  What is your office phone and fax number?      Phone- [phone]  [phone]  Anesthesia type (None, local, MAC, general) ?       MAC

## 2019-02-28 ENCOUNTER — Telehealth: Payer: Self-pay | Admitting: Gastroenterology

## 2019-02-28 NOTE — Telephone Encounter (Signed)
Pt's wife returned your call. Pls call her again.

## 2019-02-28 NOTE — Telephone Encounter (Signed)
Spoke with patient and informed him to hold Xarelto one day prior to his procedure. He verbalized understanding and will resume as per instructions at discharge.

## 2019-03-01 ENCOUNTER — Ambulatory Visit (INDEPENDENT_AMBULATORY_CARE_PROVIDER_SITE_OTHER): Payer: Federal, State, Local not specified - PPO

## 2019-03-01 ENCOUNTER — Other Ambulatory Visit: Payer: Self-pay

## 2019-03-01 DIAGNOSIS — Z1159 Encounter for screening for other viral diseases: Secondary | ICD-10-CM | POA: Diagnosis not present

## 2019-03-02 LAB — SARS CORONAVIRUS 2 (TAT 6-24 HRS): SARS Coronavirus 2: NEGATIVE

## 2019-03-03 ENCOUNTER — Encounter: Payer: Self-pay | Admitting: Gastroenterology

## 2019-03-03 ENCOUNTER — Ambulatory Visit (AMBULATORY_SURGERY_CENTER): Payer: Federal, State, Local not specified - PPO | Admitting: Gastroenterology

## 2019-03-03 ENCOUNTER — Other Ambulatory Visit: Payer: Self-pay

## 2019-03-03 VITALS — BP 139/96 | HR 72 | Temp 98.3°F | Resp 17 | Ht 69.0 in | Wt 276.0 lb

## 2019-03-03 DIAGNOSIS — K648 Other hemorrhoids: Secondary | ICD-10-CM | POA: Diagnosis not present

## 2019-03-03 DIAGNOSIS — D122 Benign neoplasm of ascending colon: Secondary | ICD-10-CM

## 2019-03-03 DIAGNOSIS — K635 Polyp of colon: Secondary | ICD-10-CM

## 2019-03-03 DIAGNOSIS — R6889 Other general symptoms and signs: Secondary | ICD-10-CM

## 2019-03-03 DIAGNOSIS — K629 Disease of anus and rectum, unspecified: Secondary | ICD-10-CM | POA: Diagnosis not present

## 2019-03-03 DIAGNOSIS — K6289 Other specified diseases of anus and rectum: Secondary | ICD-10-CM

## 2019-03-03 MED ORDER — SODIUM CHLORIDE 0.9 % IV SOLN: 500.0000 mL | INTRAVENOUS | Status: DC

## 2019-03-03 NOTE — Progress Notes (Signed)
Temp LC V/S CW 

## 2019-03-03 NOTE — Patient Instructions (Signed)
Handouts on hemorrhoids and polyps given to you today.  Follow a high fiber diet and add metamucil daily. Resume xarelto.  Await pathology results  YOU HAD AN ENDOSCOPIC PROCEDURE TODAY AT [facility]:   Refer to the procedure report that was given to you for any specific questions about what was found during the examination.  If the procedure report does not answer your questions, please call your gastroenterologist to clarify.  If you requested that your care partner not be given the details of your procedure findings, then the procedure report has been included in a sealed envelope for you to review at your convenience later.  YOU SHOULD EXPECT: Some feelings of bloating in the abdomen. Passage of more gas than usual.  Walking can help get rid of the air that was put into your GI tract during the procedure and reduce the bloating. If you had a lower endoscopy (such as a colonoscopy or flexible sigmoidoscopy) you may notice spotting of blood in your stool or on the toilet paper. If you underwent a bowel prep for your procedure, you may not have a normal bowel movement for a few days.  Please Note:  You might notice some irritation and congestion in your nose or some drainage.  This is from the oxygen used during your procedure.  There is no need for concern and it should clear up in a day or so.  SYMPTOMS TO REPORT IMMEDIATELY:   Following lower endoscopy (colonoscopy or flexible sigmoidoscopy):  Excessive amounts of blood in the stool  Significant tenderness or worsening of abdominal pains  Swelling of the abdomen that is new, acute  Fever of 100F or higher  For urgent or emergent issues, a gastroenterologist can be reached at any hour by calling [phone].   DIET:  We do recommend a small meal at first, but then you may proceed to your regular diet.  Drink plenty of fluids but you should avoid alcoholic beverages for 24 hours.  ACTIVITY:  You should plan to take it  easy for the rest of today and you should NOT DRIVE or use heavy machinery until tomorrow (because of the sedation medicines used during the test).    FOLLOW UP: Our staff will call the number listed on your records 48-72 hours following your procedure to check on you and address any questions or concerns that you may have regarding the information given to you following your procedure. If we do not reach you, we will leave a message.  We will attempt to reach you two times.  During this call, we will ask if you have developed any symptoms of COVID 19. If you develop any symptoms (ie: fever, flu-like symptoms, shortness of breath, cough etc.) before then, please call [phone].  If you test positive for Covid 19 in the 2 weeks post procedure, please call and report this information to [place].    If any biopsies were taken you will be contacted by phone or by letter within the next 1-3 weeks.  Please call us at [phone] if you have not heard about the biopsies in 3 weeks.    SIGNATURES/CONFIDENTIALITY: You and/or your care partner have signed paperwork which will be entered into your electronic medical record.  These signatures attest to the fact that that the information above on your After Visit Summary has been reviewed and is understood.  Full responsibility of the confidentiality of this discharge information lies with you and/or your care-partner.

## 2019-03-03 NOTE — Progress Notes (Signed)
Report given to PACU, vss 

## 2019-03-03 NOTE — Op Note (Signed)
[place] Patient Name: Scott Walter Procedure Date: 03/03/2019 2:20 PM MRN: [id number] Endoscopist: Thornton Park MD, MD Age: 49 Referring MD:  Date of Birth: 06/19/1970 Gender: Male Account #: [id number] Procedure:                Colonoscopy Indications:              Rectal pain                           History of abnormal rectal exam                           Rectal pain x 5 days                           Mesenteric adenitis 11/20                           No prior colonoscopy                           Atrial fibrillation on Xarelto [name])                           No known family history of colon cancer or polyps Medicines:                Monitored Anesthesia Care Procedure:                Pre-Anesthesia Assessment:                           - Prior to the procedure, a History and Physical                            was performed, and patient medications and                            allergies were reviewed. The patient's tolerance of                            previous anesthesia was also reviewed. The risks                            and benefits of the procedure and the sedation                            options and risks were discussed with the patient.                            All questions were answered, and informed consent                            was obtained. Prior Anticoagulants: The patient has                            taken Xarelto (rivaroxaban), last dose was 5 days  prior to procedure. ASA Grade Assessment: III - A                            patient with severe systemic disease. After                            reviewing the risks and benefits, the patient was                            deemed in satisfactory condition to undergo the                            procedure.                           After obtaining informed consent, the colonoscope                            was passed under direct vision. Throughout  the                            procedure, the patient's blood pressure, pulse, and                            oxygen saturations were monitored continuously. The                            Colonoscope was introduced through the anus and                            advanced to the 3 cm into the ileum. A second                            forward view of the right colon was performed. The                            colonoscopy was performed with moderate difficulty                            due to the patient's body habitus and movement of                            the colon with abdominal breathing. Successful                            completion of the procedure was aided by applying                            abdominal pressure. The patient tolerated the                            procedure well. The quality of the bowel  preparation was adequate. The terminal ileum,                            ileocecal valve, appendiceal orifice, and rectum                            were photographed. Scope In: 2:23:17 PM Scope Out: 2:40:30 PM Scope Withdrawal Time: 0 hours 15 minutes 32 seconds  Total Procedure Duration: 0 hours 17 minutes 13 seconds  Findings:                 Hemorrhoids were found on perianal exam. No fissure.                           A 2 mm polyp was found in the ascending colon. The                            polyp was sessile. The polyp was removed with a                            cold snare. Resection and retrieval were complete.                            Estimated blood loss was minimal.                           A two hyperpigmented spots were found in the cecum.                            These are of unclear clinical significance. A third                            area of violacious mucosa was identified. Biopsies                            were taken with a cold forceps for histology.                            Estimated blood loss was  minimal.                           The exam was otherwise without abnormality on                            direct and retroflexion views. No abnormalities                            seen in the rectum or in the anal canal. Complications:            No immediate complications. Estimated blood loss:                            Minimal. Estimated Blood Loss:     Estimated blood loss was minimal. Impression:               -  Hemorrhoids found on perianal exam.                           - One 2 mm polyp in the ascending colon, removed                            with a cold snare. Resected and retrieved.                           - Hyperpigmented spots mucosa in the cecum.                            Biopsied.                           - The examination was otherwise normal on direct                            and retroflexion views. Recommendation:           - Patient has a contact number available for                            emergencies. The signs and symptoms of potential                            delayed complications were discussed with the                            patient. Return to normal activities tomorrow.                            Written discharge instructions were provided to the                            patient.                           - High fiber diet.                           - Add a daily stool bulking agent such as Metamucil.                           - Resume Xarelto (rivaroxaban) at prior dose                            tomorrow.                           - Await pathology results.                           - Repeat colonoscopy date to be determined after                            pending  pathology results are reviewed for                            surveillance.                           - Consider follow-up CT of the abdomen given the                            history of mesenteric adenitis. Thornton Park MD, MD 03/03/2019 2:53:05 PM This report has been  signed electronically.

## 2019-03-03 NOTE — Progress Notes (Signed)
Called to room to assist during endoscopic procedure.  Patient ID and intended procedure confirmed with present staff. Received instructions for my participation in the procedure from the performing physician.  

## 2019-03-07 ENCOUNTER — Telehealth: Payer: Self-pay

## 2019-03-07 ENCOUNTER — Telehealth: Payer: Self-pay | Admitting: *Deleted

## 2019-03-07 NOTE — Telephone Encounter (Signed)
Left message on follow up call. 

## 2019-03-07 NOTE — Telephone Encounter (Signed)
1. Have you developed a fever since your procedure? no  2.   Have you had an respiratory symptoms (SOB or cough) since your procedure? no  3.   Have you tested positive for COVID 19 since your procedure no  4.   Have you had any family members/close contacts diagnosed with the COVID 19 since your procedure?  no   If yes to any of these questions please route to Joylene John, RN and Alphonsa Gin, Therapist, sports.  Follow up Call-  Call back number 03/03/2019  Post procedure Call Back phone  # [phone]  Permission to leave phone message Yes  Some recent data might be hidden     Patient questions:  Do you have a fever, pain , or abdominal swelling? No. Pain Score  0 *  Have you tolerated food without any problems? Yes.    Have you been able to return to your normal activities? Yes.    Do you have any questions about your discharge instructions: Diet   No. Medications  No. Follow up visit  No.  Do you have questions or concerns about your Care? No.  Actions: * If pain score is 4 or above: No action needed, pain <4.

## 2019-03-09 ENCOUNTER — Encounter: Payer: Self-pay | Admitting: *Deleted

## 2019-03-13 ENCOUNTER — Other Ambulatory Visit: Payer: Self-pay | Admitting: Internal Medicine

## 2019-03-29 DIAGNOSIS — R5383 Other fatigue: Secondary | ICD-10-CM | POA: Diagnosis not present

## 2019-03-29 DIAGNOSIS — N529 Male erectile dysfunction, unspecified: Secondary | ICD-10-CM | POA: Diagnosis not present

## 2019-03-29 DIAGNOSIS — Z79890 Hormone replacement therapy (postmenopausal): Secondary | ICD-10-CM | POA: Diagnosis not present

## 2019-03-29 DIAGNOSIS — E291 Testicular hypofunction: Secondary | ICD-10-CM | POA: Diagnosis not present

## 2019-04-06 DIAGNOSIS — G4709 Other insomnia: Secondary | ICD-10-CM | POA: Diagnosis not present

## 2019-04-06 DIAGNOSIS — R5382 Chronic fatigue, unspecified: Secondary | ICD-10-CM | POA: Diagnosis not present

## 2019-04-11 ENCOUNTER — Encounter: Payer: Self-pay | Admitting: Family Medicine

## 2019-04-13 DIAGNOSIS — F4310 Post-traumatic stress disorder, unspecified: Secondary | ICD-10-CM | POA: Diagnosis not present

## 2019-04-13 DIAGNOSIS — F329 Major depressive disorder, single episode, unspecified: Secondary | ICD-10-CM | POA: Diagnosis not present

## 2019-04-13 NOTE — Telephone Encounter (Signed)
The patient is just needing the letter to go to whom it may concern and they just need to know how long diagnosed or just need to know that he has these dizzy spells.   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  The letter is to go to the judge and his attorney to drop the community service because of his health condition.  Please advise

## 2019-04-14 ENCOUNTER — Encounter: Payer: Self-pay | Admitting: Family Medicine

## 2019-04-14 NOTE — Progress Notes (Signed)
Letter done explaining his risk of sudden dizziness.  See under letters tab.   I did copy pt with the letter.

## 2019-04-17 ENCOUNTER — Encounter: Payer: Self-pay | Admitting: Internal Medicine

## 2019-04-17 NOTE — Progress Notes (Signed)
Error

## 2019-05-18 DIAGNOSIS — M2241 Chondromalacia patellae, right knee: Secondary | ICD-10-CM | POA: Diagnosis not present

## 2019-05-30 ENCOUNTER — Other Ambulatory Visit: Payer: Self-pay

## 2019-05-31 ENCOUNTER — Ambulatory Visit (INDEPENDENT_AMBULATORY_CARE_PROVIDER_SITE_OTHER): Payer: Federal, State, Local not specified - PPO | Admitting: Family Medicine

## 2019-05-31 ENCOUNTER — Encounter: Payer: Self-pay | Admitting: Family Medicine

## 2019-05-31 VITALS — BP 128/82 | HR 91 | Temp 98.0°F | Wt 279.3 lb

## 2019-05-31 DIAGNOSIS — F5104 Psychophysiologic insomnia: Secondary | ICD-10-CM

## 2019-05-31 DIAGNOSIS — Z833 Family history of diabetes mellitus: Secondary | ICD-10-CM | POA: Diagnosis not present

## 2019-05-31 DIAGNOSIS — R350 Frequency of micturition: Secondary | ICD-10-CM | POA: Diagnosis not present

## 2019-05-31 LAB — POCT GLYCOSYLATED HEMOGLOBIN (HGB A1C): Hemoglobin A1C: 5.8 % — AB (ref 4.0–5.6)

## 2019-05-31 MED ORDER — HYDROXYZINE HCL 25 MG PO TABS: ORAL_TABLET | ORAL | 0 refills | Status: DC

## 2019-05-31 NOTE — Progress Notes (Signed)
Subjective:     Patient ID: Scott Walter, male   DOB: 08-22-1970, 49 y.o.   MRN: [id number]  HPI [name] is seen for the following issues   Concerned about possible diabetes.  He has strong family history of type 2 diabetes.  He recently had some mild urine frequency.  No weight loss.  Some increased thirst.  has had increased fatigue.  Previous A1c's have been more in the prediabetic range.  He does consume a fair amount of high glycemic things such as orange juice and grape juice most days.  Increase general fatigue.  He has obstructive sleep apnea has not tolerated CPAP in the past.  He states he usually only gets about 6 to 12 hours of sleep per week.  He does not consume any caffeine.  He is tried multiple things including trazodone and Ambien but had side effects with both of those.  He has had brief improvements with Benadryl in the past but not recently.  He takes Wellbutrin but takes that in the morning.  Rare alcohol use.  Past Medical History:  Diagnosis Date  . Allergy   . Anxiety   . Bipolar disorder ([place])    h/o SI  . Chronic insomnia   . Chronic lower back pain   . Depression   . Dyslipidemia   . Essential hypertension   . GERD (gastroesophageal reflux disease)   . [id number])    "monthly" (01/26/2013)  . History of stomach ulcers ~ 2009  . Midsternal chest pain    a. 06/2014 Myoview: EF 50%, no ischemia/infarct.  . Migraine    "maybe 2 in my lifetime" (01/26/2013)  . PAF (paroxysmal atrial fibrillation) ([place])    a. Dx 05/2012;  b. 06/2014 Xarelto started in setting of ? TIA (CHA2DS2VASc = 3);  c. 06/2014 Echo: EF 60-65%, no rwma.  [name] PTSD (post-traumatic stress disorder)   . Sleep apnea   . Stroke [facility])    Past Surgical History:  Procedure Laterality Date  . EAR CANALOPLASTY    . KNEE ARTHROSCOPY Right 09/2011  . KNEE SURGERY  02/2017   right knee  . SHOULDER SURGERY    . VASECTOMY  09/2017    reports that he quit smoking about 2 years ago. His smoking use  included cigarettes. He has a 22.50 pack-year smoking history. He has quit using smokeless tobacco.  His smokeless tobacco use included chew. He reports current alcohol use. He reports that he does not use drugs. family history includes Breast cancer in his mother; CAD in his mother; Colon cancer in his maternal grandfather; Diabetes in his mother; Emphysema in his father; HIV in his brother; Healthy in his brother, brother, brother, brother, sister, sister, sister, sister, sister, sister, sister, and sister. Allergies  Allergen Reactions  . Ambien [Zolpidem Tartrate] Other (See Comments)    "Nightmares and mood swings  . Divalproex Sodium Anaphylaxis, Shortness Of Breath and Swelling  . Valproic Acid Anaphylaxis and Swelling  . Zolpidem Anaphylaxis    Review of Systems  Constitutional: Positive for fatigue. Negative for appetite change, chills, fever and unexpected weight change.  Respiratory: Negative for shortness of breath.   Cardiovascular: Negative for chest pain.  Gastrointestinal: Negative for abdominal pain.  Endocrine: Positive for polydipsia and polyuria.  Genitourinary: Negative for dysuria.  Neurological: Negative for headaches.  Psychiatric/Behavioral: Positive for sleep disturbance. Negative for agitation and dysphoric mood.       Objective:   Physical Exam Vitals reviewed.  Constitutional:  Appearance: Normal appearance.  Cardiovascular:     Rate and Rhythm: Normal rate and regular rhythm.  Pulmonary:     Effort: Pulmonary effort is normal.     Breath sounds: Normal breath sounds.  Musculoskeletal:     Right lower leg: No edema.     Left lower leg: No edema.  Neurological:     Mental Status: He is alert.        Assessment:     #1 concerns for type 2 diabetes.  This is based on polyuria and polydipsia symptoms.  A1c today 5.8% which is stable  #2 chronic insomnia.  Sounds like this is been going on for many years and multifactorial.  Does have  obstructive sleep apnea but does not tolerate CPAP.  Has tried multiple medications in the past without success    Plan:     -Sleep hygiene discussed  -Handout given on insomnia.  Avoidance of bright lights within 1 to 2 hours of sleep  -Consider trial of Atarax 25 mg 1-2 nightly  -We discussed importance of low glycemic diet and weight control and would recommend at least yearly monitoring of A1c given strong family history  Eulas Post MD [facility]

## 2019-05-31 NOTE — Patient Instructions (Signed)

## 2019-06-08 DIAGNOSIS — M7651 Patellar tendinitis, right knee: Secondary | ICD-10-CM | POA: Diagnosis not present

## 2019-06-08 DIAGNOSIS — M25561 Pain in right knee: Secondary | ICD-10-CM | POA: Diagnosis not present

## 2019-06-10 ENCOUNTER — Other Ambulatory Visit: Payer: Self-pay | Admitting: Internal Medicine

## 2019-06-20 DIAGNOSIS — M2241 Chondromalacia patellae, right knee: Secondary | ICD-10-CM | POA: Diagnosis not present

## 2019-06-24 DIAGNOSIS — M7651 Patellar tendinitis, right knee: Secondary | ICD-10-CM | POA: Diagnosis not present

## 2019-06-24 DIAGNOSIS — M2241 Chondromalacia patellae, right knee: Secondary | ICD-10-CM | POA: Diagnosis not present

## 2019-06-25 ENCOUNTER — Other Ambulatory Visit: Payer: Self-pay | Admitting: Family Medicine

## 2019-06-28 ENCOUNTER — Telehealth: Payer: Self-pay

## 2019-06-28 NOTE — Telephone Encounter (Signed)
   Primary Cardiologist: Dorris Carnes, MD  Chart reviewed as part of pre-operative protocol coverage. Patient was contacted 06/28/2019 in reference to pre-operative risk assessment for pending surgery as outlined below.  Dennie Rathsack was last seen on 01/24/19 by Dr. [name]. H/o PAF, HTN, ?TIA, bipolar disorder, normal NST 11/2018, dyslipidemia, anxiety, chronic low back pain, depression, ETOH abuse, stomach ulcers, PSTD. He affirms he is doing well without any new chest pain or SOB. He is limited by knee pain primarily but is able to go up and down stairs in house and perform >4 METS without angina or dyspnea. Therefore, based on ACC/AHA guidelines, the patient would be at acceptable risk for the planned procedure without further cardiovascular testing.   Will route to pharm for input on anticoagulation then plan to bundle final recs.  Charlie Pitter, PA-C 06/28/2019, 2:46 PM

## 2019-06-28 NOTE — Telephone Encounter (Signed)
   [facility] Pre-operative Risk Assessment    Request for surgical clearance:  1. What type of surgery is being performed? R knee scope, open patellar chondral allog    2. When is this surgery scheduled? 07/27/19   3. What type of clearance is required (medical clearance vs. Pharmacy clearance to hold med vs. Both)?  Both   4. Are there any medications that need to be held prior to surgery and how long? Xarelto- please instruct how patient should hold    5. Practice name and name of physician performing surgery? [facility] Dr. Victorino December    6. What is your office phone number [phone]    7.   What is your office fax number [phone] Attn: Orson Slick  8.   Anesthesia type (None, local, MAC, general) ? None listed    Scott Walter 06/28/2019, 10:53 AM  _________________________________________________________________   (provider comments below)

## 2019-06-29 NOTE — Telephone Encounter (Signed)
Patient with diagnosis of afib on Xarelto for anticoagulation.    Procedure: R knee scope, open patellar chondral allog   Date of procedure: 07/27/19  CHADS2-VASc score of  3 (HTN,  stroke/tia x 2)  CrCl 130 ml/min  Due to patient hx of possible TIA, I will defer to Dr. [name] on length of hold.

## 2019-06-29 NOTE — Telephone Encounter (Signed)
Hx of TIA is questionable Would hold Xarelto 24 hours prior

## 2019-06-29 NOTE — Telephone Encounter (Signed)
   Primary Cardiologist: Dorris Carnes, MD  Chart reviewed as part of pre-operative protocol coverage. Given past medical history and time since last visit, based on ACC/AHA guidelines, Scott Walter would be at acceptable risk for the planned procedure without further cardiovascular testing.   OK to hold Xarelto one day pre op.   I will route this recommendation to the requesting party via Epic fax function and remove from pre-op pool.  Please call with questions.  Kerin Ransom, PA-C 06/29/2019, 2:18 PM

## 2019-06-30 DIAGNOSIS — K080 Exfoliation of teeth due to systemic causes: Secondary | ICD-10-CM | POA: Diagnosis not present

## 2019-07-05 DIAGNOSIS — E291 Testicular hypofunction: Secondary | ICD-10-CM | POA: Diagnosis not present

## 2019-07-05 DIAGNOSIS — N529 Male erectile dysfunction, unspecified: Secondary | ICD-10-CM | POA: Diagnosis not present

## 2019-07-27 DIAGNOSIS — G8918 Other acute postprocedural pain: Secondary | ICD-10-CM | POA: Diagnosis not present

## 2019-07-27 DIAGNOSIS — M94261 Chondromalacia, right knee: Secondary | ICD-10-CM | POA: Diagnosis not present

## 2019-08-19 DIAGNOSIS — M2241 Chondromalacia patellae, right knee: Secondary | ICD-10-CM | POA: Diagnosis not present

## 2019-08-21 DIAGNOSIS — Z03818 Encounter for observation for suspected exposure to other biological agents ruled out: Secondary | ICD-10-CM | POA: Diagnosis not present

## 2019-08-21 DIAGNOSIS — B349 Viral infection, unspecified: Secondary | ICD-10-CM | POA: Diagnosis not present

## 2019-08-28 ENCOUNTER — Emergency Department (HOSPITAL_BASED_OUTPATIENT_CLINIC_OR_DEPARTMENT_OTHER)
Admission: EM | Admit: 2019-08-28 | Discharge: 2019-08-28 | Disposition: A | Discharge status: Home / Self Care | Payer: Federal, State, Local not specified - PPO | Attending: Emergency Medicine | Admitting: Emergency Medicine

## 2019-08-28 ENCOUNTER — Emergency Department (HOSPITAL_BASED_OUTPATIENT_CLINIC_OR_DEPARTMENT_OTHER): Payer: Federal, State, Local not specified - PPO

## 2019-08-28 ENCOUNTER — Encounter (HOSPITAL_BASED_OUTPATIENT_CLINIC_OR_DEPARTMENT_OTHER): Payer: Self-pay

## 2019-08-28 ENCOUNTER — Other Ambulatory Visit: Payer: Self-pay

## 2019-08-28 DIAGNOSIS — R350 Frequency of micturition: Secondary | ICD-10-CM | POA: Diagnosis not present

## 2019-08-28 DIAGNOSIS — R109 Unspecified abdominal pain: Secondary | ICD-10-CM | POA: Diagnosis not present

## 2019-08-28 DIAGNOSIS — Z8673 Personal history of transient ischemic attack (TIA), and cerebral infarction without residual deficits: Secondary | ICD-10-CM | POA: Diagnosis not present

## 2019-08-28 DIAGNOSIS — I10 Essential (primary) hypertension: Secondary | ICD-10-CM | POA: Diagnosis not present

## 2019-08-28 DIAGNOSIS — N3289 Other specified disorders of bladder: Secondary | ICD-10-CM | POA: Diagnosis not present

## 2019-08-28 DIAGNOSIS — N410 Acute prostatitis: Secondary | ICD-10-CM | POA: Diagnosis not present

## 2019-08-28 DIAGNOSIS — I480 Paroxysmal atrial fibrillation: Secondary | ICD-10-CM | POA: Diagnosis not present

## 2019-08-28 DIAGNOSIS — Z7901 Long term (current) use of anticoagulants: Secondary | ICD-10-CM | POA: Diagnosis not present

## 2019-08-28 DIAGNOSIS — Z87891 Personal history of nicotine dependence: Secondary | ICD-10-CM | POA: Insufficient documentation

## 2019-08-28 DIAGNOSIS — R319 Hematuria, unspecified: Secondary | ICD-10-CM | POA: Diagnosis not present

## 2019-08-28 DIAGNOSIS — Z79899 Other long term (current) drug therapy: Secondary | ICD-10-CM | POA: Insufficient documentation

## 2019-08-28 LAB — URINALYSIS, MICROSCOPIC (REFLEX)

## 2019-08-28 LAB — URINALYSIS, ROUTINE W REFLEX MICROSCOPIC
Bilirubin Urine: NEGATIVE
Glucose, UA: NEGATIVE mg/dL
Hgb urine dipstick: NEGATIVE
Ketones, ur: NEGATIVE mg/dL
Nitrite: NEGATIVE
Protein, ur: NEGATIVE mg/dL
Specific Gravity, Urine: 1.02 (ref 1.005–1.030)
pH: 7.5 (ref 5.0–8.0)

## 2019-08-28 LAB — BASIC METABOLIC PANEL
Anion gap: 10 (ref 5–15)
BUN: 11 mg/dL (ref 6–20)
CO2: 25 mmol/L (ref 22–32)
Calcium: 9.1 mg/dL (ref 8.9–10.3)
Chloride: 103 mmol/L (ref 98–111)
Creatinine, Ser: 0.86 mg/dL (ref 0.61–1.24)
GFR calc Af Amer: >60 mL/min (ref >60)
GFR calc non Af Amer: >60 mL/min (ref >60)
Glucose, Bld: 94 mg/dL (ref 70–99)
Potassium: 4.1 mmol/L (ref 3.5–5.1)
Sodium: 138 mmol/L (ref 135–145)

## 2019-08-28 LAB — CBC WITH DIFFERENTIAL/PLATELET
Abs Immature Granulocytes: 0.17 10*3/uL — ABNORMAL HIGH (ref 0.00–0.07)
Basophils Absolute: 0.1 10*3/uL (ref 0.0–0.1)
Basophils Relative: 1 %
Eosinophils Absolute: 0.2 10*3/uL (ref 0.0–0.5)
Eosinophils Relative: 3 %
HCT: 43.3 % (ref 39.0–52.0)
Hemoglobin: 14.4 g/dL (ref 13.0–17.0)
Immature Granulocytes: 2 %
Lymphocytes Relative: 35 %
Lymphs Abs: 3.2 10*3/uL (ref 0.7–4.0)
MCH: 30.4 pg (ref 26.0–34.0)
MCHC: 33.3 g/dL (ref 30.0–36.0)
MCV: 91.5 fL (ref 80.0–100.0)
Monocytes Absolute: 0.5 10*3/uL (ref 0.1–1.0)
Monocytes Relative: 5 %
Neutro Abs: 4.9 10*3/uL (ref 1.7–7.7)
Neutrophils Relative %: 54 %
Platelets: 285 10*3/uL (ref 150–400)
RBC: 4.73 MIL/uL (ref 4.22–5.81)
RDW: 13.8 % (ref 11.5–15.5)
WBC: 9.1 10*3/uL (ref 4.0–10.5)
nRBC: 0 % (ref 0.0–0.2)

## 2019-08-28 MED ORDER — SULFAMETHOXAZOLE-TRIMETHOPRIM 800-160 MG PO TABS
1.0000 | ORAL_TABLET | Freq: Once | ORAL | Status: AC
  Administered 2019-08-28: 1 via ORAL
  Filled 2019-08-28: qty 1

## 2019-08-28 MED ORDER — SULFAMETHOXAZOLE-TRIMETHOPRIM 800-160 MG PO TABS
1.0000 | ORAL_TABLET | Freq: Two times a day (BID) | ORAL | 0 refills | Status: AC
Start: 2019-08-28 — End: 2019-09-25

## 2019-08-28 NOTE — ED Notes (Signed)
Patient transported to CT 

## 2019-08-28 NOTE — ED Notes (Signed)
ED Provider at bedside. 

## 2019-08-28 NOTE — ED Provider Notes (Signed)
[facility] EMERGENCY DEPARTMENT Provider Note   CSN: [id number] Arrival date & time: 08/28/19  1800     History Chief Complaint  Patient presents with  . Hematuria    Scott Walter is a 49 y.o. male with history of anxiety, bipolar disorder, dyslipidemia, hypertension, GERD, migraine headaches, paroxysmal A. fib, CVA presenting for evaluation of acute onset, progressively worsening right flank pain with associated urinary symptoms for 9 days.  He reports pain is pressure-like, becomes more severe anytime he attempts to urinate.  He feels like he is straining a great deal to produce only small amounts of urine and he notes urinary frequency as well.  Denies nausea, vomiting, chest pain or shortness of breath.  When his symptoms first began he also noted development of fevers and chills.  Maximum temperature around 104 F within the first couple of days of symptom onset.  He also developed hematuria during this time.  He had a virtual visit 6 days ago and was prescribed clindamycin 300 mg 3 times daily which he is still taking.  He states that the hematuria improved after starting the antibiotics and the fevers have resolved.  He has been drinking a lot of cranberry juice and water without relief of symptoms.  Today he felt he saw recurrent hematuria and with the persistent symptoms he came to the ED for further evaluation.  He denies bowel or bladder incontinence, saddle anesthesia, or history of IV drug use.  He is taking naproxen twice daily for knee pain after a knee procedure in June but has not noticed any improvement in his flank pain with taking the NSAIDs.  Denies testicular pain or scrotal swelling.  The history is provided by the patient and the spouse.       Past Medical History:  Diagnosis Date  . Allergy   . Anxiety   . Bipolar disorder ([place])    h/o SI  . Chronic insomnia   . Chronic lower back pain   . Depression   . Dyslipidemia   . Essential hypertension   .  GERD (gastroesophageal reflux disease)   . GMWNUUVO(536.6)    "monthly" (01/26/2013)  . History of stomach ulcers ~ 2009  . Midsternal chest pain    a. 06/2014 Myoview: EF 50%, no ischemia/infarct.  . Migraine    "maybe 2 in my lifetime" (01/26/2013)  . PAF (paroxysmal atrial fibrillation) ([place])    a. Dx 05/2012;  b. 06/2014 Xarelto started in setting of ? TIA (CHA2DS2VASc = 3);  c. 06/2014 Echo: EF 60-65%, no rwma.  [name] PTSD (post-traumatic stress disorder)   . Sleep apnea   . Stroke [facility])     Patient Active Problem List   Diagnosis Date Noted  . OSA (obstructive sleep apnea) 06/10/2017  . Preventative health care 09/06/2014  . Testicular discomfort 09/06/2014  . Low back pain 09/06/2014  . Dyslipidemia   . Chest pain 07/18/2014  . Visual disturbance 07/18/2014  . Paroxysmal atrial fibrillation ([place]) 07/18/2014  . Chest pain with moderate risk for cardiac etiology 07/18/2014  . Amaurosis fugax of right eye 07/18/2014  . TIA (transient ischemic attack)   . Shortness of breath 03/02/2013  . Chronic insomnia 03/02/2013  . Bipolar disorder ([place]) 03/02/2013  . Precordial pain 01/26/2013  . Hyperglycemia 01/26/2013  . Essential hypertension 06/12/2012  . ETOH abuse 06/12/2012    Past Surgical History:  Procedure Laterality Date  . EAR CANALOPLASTY    . KNEE ARTHROSCOPY Right 09/2011  . KNEE  SURGERY  02/2017   right knee  . SHOULDER SURGERY    . VASECTOMY  09/2017       Family History  Problem Relation Age of Onset  . CAD Mother   . Diabetes Mother   . Breast cancer Mother   . Emphysema Father   . Healthy Sister   . Healthy Brother   . Healthy Sister   . Healthy Sister   . Healthy Sister   . Healthy Sister   . Healthy Sister   . Healthy Sister   . Healthy Sister   . Healthy Brother   . Healthy Brother   . Healthy Brother   . HIV Brother   . Colon cancer Maternal Grandfather   . Esophageal cancer Neg Hx   . Stomach cancer Neg Hx   . Pancreatic cancer Neg Hx      Social History   Tobacco Use  . Smoking status: Former Smoker    Packs/day: 0.75    Years: 30.00    Pack years: 22.50    Types: Cigarettes    Quit date: 03/07/2017    Years since quitting: 2.4  . Smokeless tobacco: Former Systems developer    Types: Chew  . Tobacco comment: 01/26/2013 "quit chewing > 20 yr ago"  Vaping Use  . Vaping Use: Former  Substance Use Topics  . Alcohol use: Never    Comment: occ  . Drug use: No    Home Medications Prior to Admission medications   Medication Sig Start Date End Date Taking? Authorizing Provider  albuterol (PROVENTIL HFA;VENTOLIN HFA) 108 (90 Base) MCG/ACT inhaler Inhale 2 puffs into the lungs every 6 (six) hours as needed for wheezing or shortness of breath. 07/21/16   Fay Records, MD  amLODipine (NORVASC) 2.5 MG tablet TAKE 1 TABLET BY MOUTH EVERY DAY 02/21/19   Fay Records, MD  Artificial Tear Ointment (DRY EYES OP) Apply to eye.    [provider]  atenolol (TENORMIN) 25 MG tablet Take 1 tablet (25 mg total) by mouth daily. 03/23/17   Fay Records, MD  buPROPion (WELLBUTRIN XL) 150 MG 24 hr tablet  02/23/19   [provider]  cetirizine (ZYRTEC) 10 MG tablet Take 10 mg by mouth daily.    [provider]  Diclofenac Sodium (PENNSAID) 2 % SOLN Pen[place]aid 20 mg/gram/actuation (2 %) topical soln in metered-dose pump  APPLY 2 PUMPS (40 MG) TO THE AFFECTED AREA BY TOPICAL ROUTE 2 TIMES PER DAY    [provider]  ergocalciferol (VITAMIN D2) 1.25 MG (50000 UT) capsule Take 50,000 Units by mouth once a week.  03/18/18   [provider]  flecainide (TAMBOCOR) 50 MG tablet TAKE 1.5 TABLETS (75 MG TOTAL) BY MOUTH 2 (TWO) TIMES DAILY. 06/10/19   Fay Records, MD  fluticasone [facility]) 50 MCG/ACT nasal spray Place 1 spray into both nostrils daily. 04/29/17   Petrucelli, Samantha R, PA-C  hydrOXYzine (ATARAX/VISTARIL) 25 MG tablet TAKE ONE TO TWO AT NIGHT AS NEEDED FOR INSOMNIA. 06/27/19   Burchette, Alinda Sierras, MD  Melatonin  10 MG CAPS Take 3 capsules by mouth at bedtime as needed.    [provider]  omeprazole (PRILOSEC) 20 MG capsule Take 1 capsule (20 mg total) by mouth 2 (two) times daily before a meal. 06/04/17   Fay Records, MD  sertraline (ZOLOFT) 100 MG tablet Take 50 mg by mouth daily. 11/20/18   [provider]  sulfamethoxazole-trimethoprim (BACTRIM DS) 800-160 MG tablet Take  1 tablet by mouth 2 (two) times daily for 28 days. 08/28/19 09/25/19  Rodell Perna A, PA-C  testosterone cypionate (DEPOTESTOTERONE CYPIONATE) 100 MG/ML injection Inject 200 mg into the muscle every 14 (fourteen) days. For IM use only    [provider]  triamcinolone cream (KENALOG) 0.1 % Apply 1 application topically 2 (two) times daily as needed. 07/28/16   Burchette, Alinda Sierras, MD  XARELTO 20 MG TABS tablet TAKE 1 TABLET BY MOUTH EVERY DAY WITH SUPPER 12/30/17   Fay Records, MD    Allergies    Ambien [zolpidem tartrate], Divalproex sodium, Valproic acid, and Zolpidem  Review of Systems   Review of Systems  Constitutional: Positive for chills and fever.  Respiratory: Negative for shortness of breath.   Cardiovascular: Negative for chest pain.  Gastrointestinal: Negative for abdominal pain, nausea and vomiting.  Genitourinary: Positive for difficulty urinating, flank pain, frequency, hematuria and urgency. Negative for scrotal swelling and testicular pain.  All other systems reviewed and are negative.   Physical Exam Updated Vital Signs BP (!) 136/92 (BP Location: Right Arm)   Pulse 61   Temp 98.2 F (36.8 C) (Oral)   Resp 16   Ht 5\' 9"  (1.753 m)   Wt 124.7 kg   SpO2 100%   BMI 40.61 kg/m   Physical Exam Vitals and nursing note reviewed.  Constitutional:      General: He is not in acute distress.    Appearance: He is well-developed. He is obese.  HENT:     Head: Normocephalic and atraumatic.  Eyes:     General:        Right eye: No discharge.        Left eye: No discharge.      Conjunctiva/sclera: Conjunctivae normal.  Neck:     Vascular: No JVD.     Trachea: No tracheal deviation.  Cardiovascular:     Rate and Rhythm: Normal rate and regular rhythm.     Heart sounds: Normal heart sounds.  Pulmonary:     Effort: Pulmonary effort is normal.     Breath sounds: Normal breath sounds.  Abdominal:     General: Bowel sounds are normal. There is no distension.     Palpations: Abdomen is soft.     Tenderness: There is no abdominal tenderness. There is right CVA tenderness. There is no guarding or rebound.  Musculoskeletal:     Comments: No midline lumbar spine tenderness.  No paralumbar muscle tenderness.  Skin:    General: Skin is warm and dry.     Findings: No erythema.  Neurological:     Mental Status: He is alert.  Psychiatric:        Behavior: Behavior normal.     ED Results / Procedures / Treatments   Labs (all labs ordered are listed, but only abnormal results are displayed) Labs Reviewed  URINALYSIS, ROUTINE W REFLEX MICROSCOPIC - Abnormal; Notable for the following components:      Result Value   Leukocytes,Ua TRACE (*)    All other components within normal limits  URINALYSIS, MICROSCOPIC (REFLEX) - Abnormal; Notable for the following components:   Bacteria, UA FEW (*)    All other components within normal limits  CBC WITH DIFFERENTIAL/PLATELET - Abnormal; Notable for the following components:   Abs Immature Granulocytes 0.17 (*)    All other components within normal limits  URINE CULTURE  BASIC METABOLIC PANEL    EKG None  Radiology CT Renal Stone Study  Result Date:  08/28/2019 CLINICAL DATA:  49 year old male with right flank pain. Concern for kidney stone. EXAM: CT ABDOMEN AND PELVIS WITHOUT CONTRAST TECHNIQUE: Multidetector CT imaging of the abdomen and pelvis was performed following the standard protocol without IV contrast. COMPARISON:  CT abdomen pelvis dated 12/19/2018. FINDINGS: Evaluation of this exam is limited in the absence of  intravenous contrast. Lower chest: The visualized lung bases are clear. No intra-abdominal free air or free fluid. Hepatobiliary: No focal liver abnormality is seen. No gallstones, gallbladder wall thickening, or biliary dilatation. Pancreas: Unremarkable. No pancreatic ductal dilatation or surrounding inflammatory changes. Spleen: Normal in size without focal abnormality. Adrenals/Urinary Tract: The adrenal glands are unremarkable. The kidneys, visualized ureters appear unremarkable. The urinary bladder is minimally distended. There is mild perivesical stranding as well as stranding adjacent to the prostate and seminal vesicle. Findings may represent cystitis or prostatitis. Correlation with clinical exam and urinalysis recommended. Stomach/Bowel: There is no bowel obstruction or active inflammation. The appendix is unremarkable as visualized. Vascular/Lymphatic: The abdominal aorta and IVC are grossly unremarkable on this noncontrast CT. No portal venous gas. There is no adenopathy. Reproductive: The prostate and seminal vesicles are grossly unremarkable by size criteria. Haziness of the pelvic floor adjacent to the prostate and seminal vesicle. Other: None Musculoskeletal: No acute or significant osseous findings. IMPRESSION: 1. No hydronephrosis or nephrolithiasis. 2. Findings may represent cystitis or prostatitis. Correlation with clinical exam and urinalysis recommended. 3. No bowel obstruction. Normal appendix. Electronically Signed   By: Anner Crete M.D.   On: 08/28/2019 22:44    Procedures Procedures (including critical care time)  Medications Ordered in ED Medications  sulfamethoxazole-trimethoprim (BACTRIM DS) 800-160 MG per tablet 1 tablet (1 tablet Oral Given 08/28/19 2345)    ED Course  I have reviewed the triage vital signs and the nursing notes.  Pertinent labs & imaging results that were available during my care of the patient were reviewed by me and considered in my medical  decision making (see chart for details).    MDM Rules/Calculators/A&P                          Patient presenting for evaluation of 9-day history of flank pain, difficulty urinating, hematuria.  Also had fevers initially.  Has been on clindamycin for 6 days.  He is afebrile, vital signs are stable.  He is nontoxic in appearance.  Abdomen is soft and nontender with no rebound or guarding.  Lab work reviewed and interpreted by myself shows no leukocytosis, no anemia, no renal insufficiency or metabolic derangements.  UA is equivocal for UTI but renal stone study performed shows evidence of cystitis or prostatitis.  No evidence of nephrolithiasis, renal abscess, hydronephrosis.  No evidence of acute surgical abdominal pathology.  Given his feelings of urinary retention, urgency, frequency and flank pain I think it would be reasonable to treat with a course of Bactrim for coverage of prostatitis, discontinue the clindamycin.  Recommend follow-up with urology for reevaluation of symptoms.  On reevaluation patient is resting comfortably in no distress.  Discussed strict ED return precautions.  Patient and wife verbalized understanding of and agreement with plan and patient is stable for discharge at this time.  Discussed with Dr. Sedonia Small who agrees with assessment and plan at this time. Final Clinical Impression(s) / ED Diagnoses Final diagnoses:  Acute prostatitis    Rx / DC Orders ED Discharge Orders         Ordered  sulfamethoxazole-trimethoprim (BACTRIM DS) 800-160 MG tablet  2 times daily     Discontinue  Reprint     08/28/19 2328           Renita Papa, PA-C 08/29/19 1618    Maudie Flakes, MD 08/30/19 0009

## 2019-08-28 NOTE — ED Triage Notes (Signed)
Pt states "I think I have kidney stones" reports blood in urine with back pain and urge to urinate with decreased output. Pt reports pain to right side more than left.

## 2019-08-28 NOTE — Discharge Instructions (Addendum)
Please take all of your antibiotics until finished!   Take your antibiotics with food.  Common side effects of antibiotics include nausea, vomiting, abdominal discomfort, and diarrhea. You may help offset some of this with probiotics which you can buy or get in yogurt. Do not eat  or take the probiotics until 2 hours after your antibiotic.    Stop taking the clindamycin.  Start taking Bactrim.  You can take 1 to 2 tablets of Tylenol (350mg -1000mg  depending on the dose) every 6 hours as needed for pain.  Do not exceed 4000 mg of Tylenol daily.  If your pain persists you can take a doses of naproxen as prescribed in between doses of Tylenol. Take this with food to avoid upset stomach issues.  Follow-up with urology or your PCP for reevaluation of your symptoms.  Return to the emergency department if any concerning signs or symptoms develop such as fevers, worsening pain, inability to urinate at all, persistent vomiting.

## 2019-08-30 LAB — URINE CULTURE: Culture: NO GROWTH

## 2019-08-31 DIAGNOSIS — M25561 Pain in right knee: Secondary | ICD-10-CM | POA: Insufficient documentation

## 2019-09-02 DIAGNOSIS — M25561 Pain in right knee: Secondary | ICD-10-CM | POA: Diagnosis not present

## 2019-09-07 DIAGNOSIS — M25561 Pain in right knee: Secondary | ICD-10-CM | POA: Diagnosis not present

## 2019-09-14 DIAGNOSIS — G4733 Obstructive sleep apnea (adult) (pediatric): Secondary | ICD-10-CM | POA: Diagnosis not present

## 2019-09-14 DIAGNOSIS — M25561 Pain in right knee: Secondary | ICD-10-CM | POA: Diagnosis not present

## 2019-10-13 DIAGNOSIS — E291 Testicular hypofunction: Secondary | ICD-10-CM | POA: Diagnosis not present

## 2019-10-13 DIAGNOSIS — N529 Male erectile dysfunction, unspecified: Secondary | ICD-10-CM | POA: Diagnosis not present

## 2019-10-29 DIAGNOSIS — Z20822 Contact with and (suspected) exposure to covid-19: Secondary | ICD-10-CM | POA: Diagnosis not present

## 2019-11-02 DIAGNOSIS — F419 Anxiety disorder, unspecified: Secondary | ICD-10-CM | POA: Diagnosis not present

## 2019-11-02 DIAGNOSIS — G4733 Obstructive sleep apnea (adult) (pediatric): Secondary | ICD-10-CM | POA: Diagnosis not present

## 2019-11-02 DIAGNOSIS — F329 Major depressive disorder, single episode, unspecified: Secondary | ICD-10-CM | POA: Diagnosis not present

## 2019-11-02 DIAGNOSIS — F5104 Psychophysiologic insomnia: Secondary | ICD-10-CM | POA: Diagnosis not present

## 2019-12-22 ENCOUNTER — Telehealth: Payer: Self-pay | Admitting: Internal Medicine

## 2019-12-22 ENCOUNTER — Telehealth: Payer: Self-pay | Admitting: *Deleted

## 2019-12-22 NOTE — Telephone Encounter (Signed)
Patient's wife, Lollie Marrow, states the DMV is requiring that patient complete a breathing test and written documentation is needed from Dr. [name]. Patient has an appointment scheduled for 01/06/20. However, Lollie Marrow states documentation is needed within 1 week. She is requesting assistance with working patient in for sooner appointment or having documentation completed within the next week. Please return call to discuss further.

## 2019-12-22 NOTE — Telephone Encounter (Signed)
I spoke with the patient. The DMV is requiring a breathing test and documentation from 2 physicians to support that he is unable to properly use an interlock device that's being installed on his vehicle.  He is requesting asap.  He does not have a pulmonary doctor.  He states he is SOB all the time.  Pt aware I am reviewing with his doctor and will call him with recommendations.

## 2019-12-22 NOTE — Telephone Encounter (Signed)
The pt had PFTs done in 2015 which showed mild restriction.   Unless there is signficant change I dont think this would allow for letter I can sign a letter that says he is having testing done if that will help but right now cannpt write letter that says he is not able to perform

## 2019-12-22 NOTE — Telephone Encounter (Signed)
Patient's wife called stating patient needs an appointment for a breathing test for the [facility]. Shelly states there is a form to be filled out. I made shelly aware our first available is in December for a in patient office visit. I made Shelly aware I would get this to Dr Elease Hashimoto to tell me when I can schedule this.

## 2019-12-23 NOTE — Telephone Encounter (Signed)
Called and lvm for patient call back about making an appt.

## 2019-12-26 ENCOUNTER — Encounter: Payer: Self-pay | Admitting: Family Medicine

## 2019-12-26 ENCOUNTER — Ambulatory Visit (INDEPENDENT_AMBULATORY_CARE_PROVIDER_SITE_OTHER): Payer: Federal, State, Local not specified - PPO | Admitting: Family Medicine

## 2019-12-26 ENCOUNTER — Other Ambulatory Visit: Payer: Self-pay

## 2019-12-26 VITALS — BP 134/88 | HR 82 | Temp 98.4°F | Ht 69.0 in | Wt 262.0 lb

## 2019-12-26 DIAGNOSIS — R062 Wheezing: Secondary | ICD-10-CM | POA: Diagnosis not present

## 2019-12-26 DIAGNOSIS — G43909 Migraine, unspecified, not intractable, without status migrainosus: Secondary | ICD-10-CM

## 2019-12-26 MED ORDER — TOPIRAMATE 25 MG PO TABS
ORAL_TABLET | ORAL | 1 refills | Status: DC
Start: 2019-12-26 — End: 2020-01-20

## 2019-12-26 NOTE — Patient Instructions (Signed)
I will schedule spirometry and try to get as soon as possible.

## 2019-12-26 NOTE — Progress Notes (Signed)
Established Patient Office Visit  Subjective:  Patient ID: Scott Walter, male    DOB: Nov 19, 1970  Age: 49 y.o. MRN: [id number]  CC:  Chief Complaint  Patient presents with  . forms    needs a form fill out for dmv ,wants to discuss lab     HPI Scott Walter presents for the following issues.  He has had prior history of DWI.  In order to get his license back he is required to have installed an "ignition interlock system" which requires that he do an inhalation prior to starting the vehicle.  When he went to get this tested apparently he had difficulties with performance and there was request generated for him to be evaluated with spirometry.  He does have history of intermittent wheezing.  He uses albuterol as needed.  He states he frequently wheezes several days per week.  Currently not on any steroid inhalers.  Quit smoking 2019.  No recent significant cough or fever.  Other issue is frequent unilateral headaches.  These can last anywhere from a few hours to sometimes few days.  These are frequently throbbing and associated with nausea and light sensitivity.  He has had these for several years.  Not clear of triggers.  Takes low-dose atenolol per cardiology.  He specifically has questions regarding possible prophylaxis.  He had MRI of the brain 05/21/2017 which showed no acute abnormalities.  Past Medical History:  Diagnosis Date  . Allergy   . Anxiety   . Bipolar disorder ([place])    h/o SI  . Chronic insomnia   . Chronic lower back pain   . Depression   . Dyslipidemia   . Essential hypertension   . GERD (gastroesophageal reflux disease)   . GURKYHCW(237.6)    "monthly" (01/26/2013)  . History of stomach ulcers ~ 2009  . Midsternal chest pain    a. 06/2014 Myoview: EF 50%, no ischemia/infarct.  . Migraine    "maybe 2 in my lifetime" (01/26/2013)  . PAF (paroxysmal atrial fibrillation) ([place])    a. Dx 05/2012;  b. 06/2014 Xarelto started in setting of ? TIA (CHA2DS2VASc = 3);  c.  06/2014 Echo: EF 60-65%, no rwma.  [name] PTSD (post-traumatic stress disorder)   . Sleep apnea   . Stroke [facility])     Past Surgical History:  Procedure Laterality Date  . EAR CANALOPLASTY    . KNEE ARTHROSCOPY Right 09/2011  . KNEE SURGERY  02/2017   right knee  . SHOULDER SURGERY    . VASECTOMY  09/2017    Family History  Problem Relation Age of Onset  . CAD Mother   . Diabetes Mother   . Breast cancer Mother   . Emphysema Father   . Healthy Sister   . Healthy Brother   . Healthy Sister   . Healthy Sister   . Healthy Sister   . Healthy Sister   . Healthy Sister   . Healthy Sister   . Healthy Sister   . Healthy Brother   . Healthy Brother   . Healthy Brother   . HIV Brother   . Colon cancer Maternal Grandfather   . Esophageal cancer Neg Hx   . Stomach cancer Neg Hx   . Pancreatic cancer Neg Hx     Social History   Socioeconomic History  . Marital status: Married    Spouse name: Not on file  . Number of children: Not on file  . Years of education: Not on file  .  Highest education level: Not on file  Occupational History  . Not on file  Tobacco Use  . Smoking status: Former Smoker    Packs/day: 0.75    Years: 30.00    Pack years: 22.50    Types: Cigarettes    Quit date: 03/07/2017    Years since quitting: 2.8  . Smokeless tobacco: Former Systems developer    Types: Chew  . Tobacco comment: 01/26/2013 "quit chewing > 20 yr ago"  Vaping Use  . Vaping Use: Former  Substance and Sexual Activity  . Alcohol use: Never    Comment: occ  . Drug use: No  . Sexual activity: Not on file  Other Topics Concern  . Not on file  Social History Narrative  . Not on file   Social Determinants of Health   Financial Resource Strain:   . Difficulty of Paying Living Expenses: Not on file  Food Insecurity:   . Worried About Charity fundraiser in the Last Year: Not on file  . Ran Out of Food in the Last Year: Not on file  Transportation Needs:   . Lack of Transportation (Medical):  Not on file  . Lack of Transportation (Non-Medical): Not on file  Physical Activity:   . Days of Exercise per Week: Not on file  . Minutes of Exercise per Session: Not on file  Stress:   . Feeling of Stress : Not on file  Social Connections:   . Frequency of Communication with Friends and Family: Not on file  . Frequency of Social Gatherings with Friends and Family: Not on file  . Attends Religious Services: Not on file  . Active Member of Clubs or Organizations: Not on file  . Attends Archivist Meetings: Not on file  . Marital Status: Not on file  Intimate Partner Violence:   . Fear of Current or Ex-Partner: Not on file  . Emotionally Abused: Not on file  . Physically Abused: Not on file  . Sexually Abused: Not on file    Outpatient Medications Prior to Visit  Medication Sig Dispense Refill  . albuterol (PROVENTIL HFA;VENTOLIN HFA) 108 (90 Base) MCG/ACT inhaler Inhale 2 puffs into the lungs every 6 (six) hours as needed for wheezing or shortness of breath. 1 Inhaler 2  . amLODipine (NORVASC) 2.5 MG tablet TAKE 1 TABLET BY MOUTH EVERY DAY 90 tablet 3  . Artificial Tear Ointment (DRY EYES OP) Apply to eye.    [name] atenolol (TENORMIN) 25 MG tablet Take 1 tablet (25 mg total) by mouth daily. 90 tablet 3  . cetirizine (ZYRTEC) 10 MG tablet Take 10 mg by mouth daily.    . Diclofenac Sodium (PENNSAID) 2 % SOLN Pennsaid 20 mg/gram/actuation (2 %) topical soln in metered-dose pump  APPLY 2 PUMPS (40 MG) TO THE AFFECTED AREA BY TOPICAL ROUTE 2 TIMES PER DAY    . ergocalciferol (VITAMIN D2) 1.25 MG (50000 UT) capsule Take 50,000 Units by mouth once a week.     . flecainide (TAMBOCOR) 50 MG tablet TAKE 1.5 TABLETS (75 MG TOTAL) BY MOUTH 2 (TWO) TIMES DAILY. 270 tablet 2  . fluticasone (FLONASE) 50 MCG/ACT nasal spray Place 1 spray into both nostrils daily. 16 g 2  . hydrOXYzine (ATARAX/VISTARIL) 25 MG tablet TAKE ONE TO TWO AT NIGHT AS NEEDED FOR INSOMNIA. 180 tablet 0  . Melatonin 10  MG CAPS Take 3 capsules by mouth at bedtime as needed.    [name] omeprazole (PRILOSEC) 20 MG capsule Take 1  capsule (20 mg total) by mouth 2 (two) times daily before a meal. 90 capsule 3  . sertraline (ZOLOFT) 100 MG tablet Take 50 mg by mouth daily.    [name] triamcinolone cream (KENALOG) 0.1 % Apply 1 application topically 2 (two) times daily as needed. 80 g 2  . XARELTO 20 MG TABS tablet TAKE 1 TABLET BY MOUTH EVERY DAY WITH SUPPER 90 tablet 1  . buPROPion (WELLBUTRIN XL) 150 MG 24 hr tablet  (Patient not taking: Reported on 12/26/2019)    . testosterone cypionate (DEPOTESTOTERONE CYPIONATE) 100 MG/ML injection Inject 200 mg into the muscle every 14 (fourteen) days. For IM use only (Patient not taking: Reported on 12/26/2019)     No facility-administered medications prior to visit.    Allergies  Allergen Reactions  . Ambien [Zolpidem Tartrate] Other (See Comments)    "Nightmares and mood swings  . Divalproex Sodium Anaphylaxis, Shortness Of Breath and Swelling  . Valproic Acid Anaphylaxis and Swelling  . Zolpidem Anaphylaxis    ROS Review of Systems  Constitutional: Negative for chills and fever.  Respiratory: Positive for wheezing.   Cardiovascular: Negative for chest pain and leg swelling.  Gastrointestinal: Negative for abdominal pain.  Neurological: Positive for headaches. Negative for seizures, syncope, speech difficulty and weakness.  Psychiatric/Behavioral: Negative for confusion.      Objective:    Physical Exam Vitals reviewed.  Constitutional:      Appearance: Normal appearance.  Cardiovascular:     Rate and Rhythm: Normal rate and regular rhythm.  Pulmonary:     Effort: Pulmonary effort is normal.     Breath sounds: Normal breath sounds. No wheezing or rales.  Musculoskeletal:     Cervical back: Neck supple.     Right lower leg: No edema.     Left lower leg: No edema.  Neurological:     General: No focal deficit present.     Mental Status: He is alert.     Cranial  Nerves: No cranial nerve deficit.     Motor: No weakness.     Coordination: Coordination normal.     BP 134/88 (BP Location: Left Arm, Patient Position: Sitting, Cuff Size: Normal)   Pulse 82   Temp 98.4 F (36.9 C) (Oral)   Ht 5\' 9"  (1.753 m)   Wt 262 lb (118.8 kg)   SpO2 96%   BMI 38.69 kg/m  Wt Readings from Last 3 Encounters:  12/26/19 262 lb (118.8 kg)  08/28/19 275 lb (124.7 kg)  05/31/19 279 lb 4.8 oz (126.7 kg)     Health Maintenance Due  Topic Date Due  . Hepatitis C Screening  Never done  . COVID-19 Vaccine (1) Never done  . HIV Screening  Never done    There are no preventive care reminders to display for this patient.  Lab Results  Component Value Date   TSH 0.64 03/12/2018   Lab Results  Component Value Date   WBC 9.1 08/28/2019   HGB 14.4 08/28/2019   HCT 43.3 08/28/2019   MCV 91.5 08/28/2019   PLT 285 08/28/2019   Lab Results  Component Value Date   NA 138 08/28/2019   K 4.1 08/28/2019   CO2 25 08/28/2019   GLUCOSE 94 08/28/2019   BUN 11 08/28/2019   CREATININE 0.86 08/28/2019   BILITOT 0.6 12/19/2018   ALKPHOS 68 12/19/2018   AST 19 12/19/2018   ALT 35 12/19/2018   PROT 7.2 12/19/2018   ALBUMIN 3.8 12/19/2018   CALCIUM 9.1  08/28/2019   ANIONGAP 10 08/28/2019   GFR 119.41 07/28/2016   Lab Results  Component Value Date   CHOL 180 03/12/2018   Lab Results  Component Value Date   HDL 41.10 07/28/2016   Lab Results  Component Value Date   LDLCALC 105 (H) 07/28/2016   Lab Results  Component Value Date   TRIG 134 03/12/2018   Lab Results  Component Value Date   CHOLHDL 4 07/28/2016   Lab Results  Component Value Date   HGBA1C 5.8 (A) 05/31/2019      Assessment & Plan:   #1 history of intermittent wheezing.  Patient requesting form completion which require spirometry for institution of ignition interlock system.  By history, sounds like he may have some persistent asthma.  -Set up spirometry and then complete forms  which he dropped off with [place] today.  If he has significant obstructive component consider starting controller medication such as Qvar or Flovent  #2 history of episodic unilateral headaches.  These do sound suspicious for migraine  -We discussed trial of Topamax 25 mg nightly for 1 week and then increase by 25 mg/week up to a dosage of 50 mg twice daily.  In office follow-up in 1 month to reassess  Meds ordered this encounter  Medications  . topiramate (TOPAMAX) 25 MG tablet    Sig: Start one po qhs for one week and then increase by 25 mg per week up to 50 mg bid    Dispense:  120 tablet    Refill:  1    Follow-up: Return in about 1 month (around 01/25/2020).    Carolann Littler, MD

## 2019-12-26 NOTE — Telephone Encounter (Signed)
Pt has any appt to come in to seen

## 2019-12-26 NOTE — Telephone Encounter (Signed)
Called and let patient's wife know.  I have moved his appointment up with Dr. [name] to 01/02/20.

## 2020-01-02 ENCOUNTER — Other Ambulatory Visit: Payer: Self-pay

## 2020-01-02 ENCOUNTER — Ambulatory Visit (INDEPENDENT_AMBULATORY_CARE_PROVIDER_SITE_OTHER): Payer: Federal, State, Local not specified - PPO | Admitting: Internal Medicine

## 2020-01-02 ENCOUNTER — Encounter: Payer: Self-pay | Admitting: Internal Medicine

## 2020-01-02 VITALS — BP 122/90 | HR 71 | Ht 69.0 in | Wt 258.2 lb

## 2020-01-02 DIAGNOSIS — I480 Paroxysmal atrial fibrillation: Secondary | ICD-10-CM | POA: Diagnosis not present

## 2020-01-02 DIAGNOSIS — R0602 Shortness of breath: Secondary | ICD-10-CM

## 2020-01-02 NOTE — Patient Instructions (Addendum)
Medication Instructions:  Hold amlodipine this week - if you have improvement in symptoms, stay off of it and call the office.  If not change by the end of the week, go back on the amlodipine.  *If you need a refill on your cardiac medications before your next appointment, please call your pharmacy*   Lab Work: none If you have labs (blood work) drawn today and your tests are completely normal, you will receive your results only by: [name] MyChart Message (if you have MyChart) OR . A paper copy in the mail If you have any lab test that is abnormal or we need to change your treatment, we will call you to review the results.   Testing/Procedures: Your physician has recommended that you have a pulmonary function test. Pulmonary Function Tests are a group of tests that measure how well air moves in and out of your lungs.   Follow-Up: At [facility], you and your health needs are our priority.  As part of our continuing mission to provide you with exceptional heart care, we have created designated Provider Care Teams.  These Care Teams include your primary Cardiologist (physician) and Advanced Practice Providers (APPs -  Physician Assistants and Nurse Practitioners) who all work together to provide you with the care you need, when you need it.  Your next appointment:   9 month(s)  The format for your next appointment:   In Person  Provider:   You may see Dorris Carnes, MD or one of the following Advanced Practice Providers on your designated Care Team:    Richardson Dopp, PA-C  Robbie Lis, [place]  Other Instructions

## 2020-01-02 NOTE — Progress Notes (Signed)
Cardiology Office Note   Date:  01/02/2020   ID:  Scott Walter, DOB 04-08-1970, MRN [id number]  PCP:  Eulas Post, MD  Cardiologist:   Dorris Carnes, MD    Follow-up of PAF and hypertension   History of Present Illness: Scott Walter is a 49 y.o. male with a history ofPAF, HTN, ? TIA, bipolar disorder     I last saw him in 2019.  Most recently he was seen in clinic in October by Pecolia Ades The patient was seen last in cardiology clinic in October 2020 by Pecolia Ades.  He complained of some dyspnea on exertion that had worsened.  He also had some pressure in his chest.  Does have a history of reflux. The patient went on to have a [place] which was normal.  He also had an event monitor placed which showed no atrial fibrillation.  Patient said he was actually doing better on those days.  I saw the pt in Dec 2020 since I saw him last he has done okay.  He is also followed by Dr. Elease Hashimoto.  He says he has not had any significant palpitations.  He denies chest pain.  He does have a history of wheezing and but currently is not having any problems.  No dizziness. He does have some swelling all over he says.  Comes and goes. He is seeking reinstatement of his driving license and says he will need to have spirometry done for this.  Current Meds  Medication Sig  . albuterol (PROVENTIL HFA;VENTOLIN HFA) 108 (90 Base) MCG/ACT inhaler Inhale 2 puffs into the lungs every 6 (six) hours as needed for wheezing or shortness of breath.  [name] amLODipine (NORVASC) 2.5 MG tablet TAKE 1 TABLET BY MOUTH EVERY DAY  . Artificial Tear Ointment (DRY EYES OP) Apply to eye.  [name] atenolol (TENORMIN) 25 MG tablet Take 1 tablet (25 mg total) by mouth daily.  . cetirizine (ZYRTEC) 10 MG tablet Take 10 mg by mouth daily.  . Diclofenac Sodium (PENNSAID) 2 % SOLN Pennsaid 20 mg/gram/actuation (2 %) topical soln in metered-dose pump  APPLY 2 PUMPS (40 MG) TO THE AFFECTED AREA BY TOPICAL ROUTE 2 TIMES PER DAY  .  ergocalciferol (VITAMIN D2) 1.25 MG (50000 UT) capsule Take 50,000 Units by mouth once a week.   . flecainide (TAMBOCOR) 50 MG tablet TAKE 1.5 TABLETS (75 MG TOTAL) BY MOUTH 2 (TWO) TIMES DAILY.  . fluticasone (FLONASE) 50 MCG/ACT nasal spray Place 1 spray into both nostrils daily.  . hydrOXYzine (ATARAX/VISTARIL) 25 MG tablet TAKE ONE TO TWO AT NIGHT AS NEEDED FOR INSOMNIA.  . Melatonin 10 MG CAPS Take 3 capsules by mouth at bedtime as needed.  [name] omeprazole (PRILOSEC) 20 MG capsule Take 1 capsule (20 mg total) by mouth 2 (two) times daily before a meal.  . sertraline (ZOLOFT) 100 MG tablet Take 50 mg by mouth daily.  [name] topiramate (TOPAMAX) 25 MG tablet Start one po qhs for one week and then increase by 25 mg per week up to 50 mg bid  . triamcinolone cream (KENALOG) 0.1 % Apply 1 application topically 2 (two) times daily as needed.  Alveda Reasons 20 MG TABS tablet TAKE 1 TABLET BY MOUTH EVERY DAY WITH SUPPER     Allergies:   Ambien [zolpidem tartrate], Divalproex sodium, Valproic acid, and Zolpidem   Past Medical History:  Diagnosis Date  . Allergy   . Anxiety   . Bipolar disorder ([place])    h/o  SI  . Chronic insomnia   . Chronic lower back pain   . Depression   . Dyslipidemia   . Essential hypertension   . GERD (gastroesophageal reflux disease)   . OINOMVEH(209.4)    "monthly" (01/26/2013)  . History of stomach ulcers ~ 2009  . Midsternal chest pain    a. 06/2014 Myoview: EF 50%, no ischemia/infarct.  . Migraine    "maybe 2 in my lifetime" (01/26/2013)  . PAF (paroxysmal atrial fibrillation) ([place])    a. Dx 05/2012;  b. 06/2014 Xarelto started in setting of ? TIA (CHA2DS2VASc = 3);  c. 06/2014 Echo: EF 60-65%, no rwma.  [name] PTSD (post-traumatic stress disorder)   . Sleep apnea   . Stroke [facility])     Past Surgical History:  Procedure Laterality Date  . EAR CANALOPLASTY    . KNEE ARTHROSCOPY Right 09/2011  . KNEE SURGERY  02/2017   right knee  . SHOULDER SURGERY    . VASECTOMY  09/2017      Social History:  The patient  reports that he quit smoking about 2 years ago. His smoking use included cigarettes. He has a 22.50 pack-year smoking history. He has quit using smokeless tobacco.  His smokeless tobacco use included chew. He reports that he does not drink alcohol and does not use drugs.   Family History:  The patient's family history includes Breast cancer in his mother; CAD in his mother; Colon cancer in his maternal grandfather; Diabetes in his mother; Emphysema in his father; HIV in his brother; Healthy in his brother, brother, brother, brother, sister, sister, sister, sister, sister, sister, sister, and sister.    ROS:  Please see the history of present illness. All other systems are reviewed and  Negative to the above problem except as noted.    PHYSICAL EXAM: VS:  BP 122/90   Pulse 71   Ht 5\' 9"  (1.753 m)   Wt 258 lb 3.2 oz (117.1 kg)   SpO2 97%   BMI 38.13 kg/m   GEN: Morbidly obese 49 year old, in no acute distress  HEENT: normal  Neck: no JVD, Cardiac: RRR; no murmurs,,no lower extremity edema  Respiratory:  clear to auscultation bilaterally GI: soft, nontender, nondistended, + BS  No hepatomegaly  MS: no deformity Moving all extremities   Skin: warm and dry, tattoos. Neuro: Cranial nerves II through XII intact Psych: euthymic mood, full affect   EKG:  EKG is ordered today.  Sinus rhythm.  71 bpm.  Nonspecific ST changes.   Lipid Panel    Component Value Date/Time   CHOL 180 03/12/2018 0000   TRIG 134 03/12/2018 0000   HDL 41.10 07/28/2016 1546   CHOLHDL 4 07/28/2016 1546   VLDL 37.4 07/28/2016 1546   LDLCALC 105 (H) 07/28/2016 1546      Wt Readings from Last 3 Encounters:  01/02/20 258 lb 3.2 oz (117.1 kg)  12/26/19 262 lb (118.8 kg)  08/28/19 275 lb (124.7 kg)      ASSESSMENT AND PLAN:  1.  PAF doing well.  Keep on current regimen of flecainide, atenolol, Xarelto.  CBC was okay   2.  Hypertension.  Blood pressure is fair diastolic  is upper limit.  He says he has swelling all over I said I told him I am not convinced at the amlodipine but that he could back off on this for a few days if the swelling improves then I would recommend he call back and I would reevaluate meds consider something  to substitute for.  Keep on atenolol.  3 pulmonary.  History of wheezing.  None now.  I will set him up for PFTs given the need for [facility] Encourage the patient to stay active.  He does have joint issues but has been able to tolerate exercise at home.  Again diet discussed.  We will set to see the patient back next summer, sooner with problems.     Current medicines are reviewed at length with the patient today.  The patient does not have concerns regarding medicines.  Signed, Dorris Carnes, MD  01/02/2020 12:07 PM    [address], [place], [place]  [address] Phone: [phone]; Fax: [phone]

## 2020-01-06 ENCOUNTER — Ambulatory Visit: Payer: Federal, State, Local not specified - PPO | Admitting: Internal Medicine

## 2020-01-18 ENCOUNTER — Telehealth: Payer: Self-pay | Admitting: Family Medicine

## 2020-01-18 NOTE — Telephone Encounter (Signed)
Pt  Said he is awaiting on a referral for pulmonary - referral is not entered

## 2020-01-20 ENCOUNTER — Other Ambulatory Visit: Payer: Self-pay | Admitting: Family Medicine

## 2020-01-23 ENCOUNTER — Other Ambulatory Visit (HOSPITAL_COMMUNITY)
Admission: RE | Admit: 2020-01-23 | Discharge: 2020-01-23 | Disposition: A | Discharge status: Home / Self Care | Payer: Federal, State, Local not specified - PPO | Source: Ambulatory Visit | Attending: Internal Medicine | Admitting: Internal Medicine

## 2020-01-23 ENCOUNTER — Telehealth: Payer: Self-pay | Admitting: Family Medicine

## 2020-01-23 DIAGNOSIS — R0602 Shortness of breath: Secondary | ICD-10-CM

## 2020-01-23 DIAGNOSIS — Z20822 Contact with and (suspected) exposure to covid-19: Secondary | ICD-10-CM | POA: Insufficient documentation

## 2020-01-23 DIAGNOSIS — Z01812 Encounter for preprocedural laboratory examination: Secondary | ICD-10-CM | POA: Insufficient documentation

## 2020-01-23 LAB — SARS CORONAVIRUS 2 (TAT 6-24 HRS): SARS Coronavirus 2: NEGATIVE

## 2020-01-23 NOTE — Telephone Encounter (Signed)
I spoke with pt's wife. Pt needs the same test twice on two different days. New order placed to be scheduled after pt has test done on 12/9.

## 2020-01-23 NOTE — Telephone Encounter (Signed)
Patient wife called and stated that she is returning the call, please advise. CB is [phone]

## 2020-01-23 NOTE — Telephone Encounter (Signed)
Pt is already scheduled for PFT on 12/9. I left him a voicemail letting him know it is the same test and to call back to set up a f/u if he wants too after the appointment.

## 2020-01-23 NOTE — Telephone Encounter (Signed)
We did order PFTs (see under Procedures tab).  This was ordered the day we saw him.  Not sure why pulmonary has not called yet.

## 2020-01-23 NOTE — Telephone Encounter (Signed)
Pt is calling in to cancel his appointment on 01/25/2020 due to not being able to do the breathing test before the follow-up with Dr. Elease Hashimoto.  Pt would like to know if a pulmonary referral has been set up for him to have it done.  Pt would like to have a call back.

## 2020-01-25 ENCOUNTER — Ambulatory Visit: Payer: Federal, State, Local not specified - PPO | Admitting: Family Medicine

## 2020-01-26 ENCOUNTER — Ambulatory Visit (INDEPENDENT_AMBULATORY_CARE_PROVIDER_SITE_OTHER): Payer: Federal, State, Local not specified - PPO | Admitting: Internal Medicine

## 2020-01-26 ENCOUNTER — Encounter: Payer: Self-pay | Admitting: *Deleted

## 2020-01-26 ENCOUNTER — Other Ambulatory Visit: Payer: Self-pay

## 2020-01-26 DIAGNOSIS — R0602 Shortness of breath: Secondary | ICD-10-CM | POA: Diagnosis not present

## 2020-01-26 LAB — PULMONARY FUNCTION TEST
DL/VA % pred: 146 %
DL/VA: 6.54 ml/min/mmHg/L
DLCO cor % pred: 109 %
DLCO cor: 31.38 ml/min/mmHg
DLCO unc % pred: 109 %
DLCO unc: 31.38 ml/min/mmHg
FEF 25-75 Post: 4.39 L/sec
FEF 25-75 Pre: 3.98 L/sec
FEF2575-%Change-Post: 10 %
FEF2575-%Pred-Post: 127 %
FEF2575-%Pred-Pre: 115 %
FEV1-%Change-Post: 0 %
FEV1-%Pred-Post: 79 %
FEV1-%Pred-Pre: 78 %
FEV1-Post: 3.03 L
FEV1-Pre: 3 L
FEV1FVC-%Change-Post: 3 %
FEV1FVC-%Pred-Pre: 110 %
FEV6-%Change-Post: -2 %
FEV6-%Pred-Post: 71 %
FEV6-%Pred-Pre: 73 %
FEV6-Post: 3.41 L
FEV6-Pre: 3.49 L
FEV6FVC-%Pred-Post: 103 %
FEV6FVC-%Pred-Pre: 103 %
FVC-%Change-Post: -2 %
FVC-%Pred-Post: 69 %
FVC-%Pred-Pre: 71 %
FVC-Post: 3.41 L
FVC-Pre: 3.49 L
Post FEV1/FVC ratio: 89 %
Post FEV6/FVC ratio: 100 %
Pre FEV1/FVC ratio: 86 %
Pre FEV6/FVC Ratio: 100 %
RV % pred: 73 %
RV: 1.45 L
TLC % pred: 71 %
TLC: 4.8 L

## 2020-01-26 NOTE — Progress Notes (Signed)
PFT done today. 

## 2020-01-27 ENCOUNTER — Ambulatory Visit (INDEPENDENT_AMBULATORY_CARE_PROVIDER_SITE_OTHER): Payer: Federal, State, Local not specified - PPO | Admitting: Internal Medicine

## 2020-01-27 DIAGNOSIS — I480 Paroxysmal atrial fibrillation: Secondary | ICD-10-CM

## 2020-01-27 DIAGNOSIS — R0602 Shortness of breath: Secondary | ICD-10-CM

## 2020-01-27 LAB — PULMONARY FUNCTION TEST
DL/VA % pred: 160 %
DL/VA: 7.19 ml/min/mmHg/L
DLCO cor % pred: 109 %
DLCO cor: 31.45 ml/min/mmHg
DLCO unc % pred: 109 %
DLCO unc: 31.45 ml/min/mmHg
FEF 25-75 Post: 4.2 L/s
FEF 25-75 Pre: 3.71 L/s
FEF2575-%Change-Post: 13 %
FEF2575-%Pred-Post: 122 %
FEF2575-%Pred-Pre: 108 %
FEV1-%Change-Post: 0 %
FEV1-%Pred-Post: 78 %
FEV1-%Pred-Pre: 78 %
FEV1-Post: 3.01 L
FEV1-Pre: 3.03 L
FEV1FVC-%Change-Post: 3 %
FEV1FVC-%Pred-Pre: 110 %
FEV6-%Change-Post: -3 %
FEV6-%Pred-Post: 71 %
FEV6-%Pred-Pre: 74 %
FEV6-Post: 3.39 L
FEV6-Pre: 3.53 L
FEV6FVC-%Pred-Post: 103 %
FEV6FVC-%Pred-Pre: 103 %
FVC-%Change-Post: -3 %
FVC-%Pred-Post: 68 %
FVC-%Pred-Pre: 71 %
FVC-Post: 3.39 L
FVC-Pre: 3.53 L
Post FEV1/FVC ratio: 89 %
Post FEV6/FVC ratio: 100 %
Pre FEV1/FVC ratio: 86 %
Pre FEV6/FVC Ratio: 100 %
RV % pred: 65 %
RV: 1.27 L
TLC % pred: 69 %
TLC: 4.67 L

## 2020-01-27 NOTE — Progress Notes (Signed)
PFT done today. 

## 2020-01-30 ENCOUNTER — Other Ambulatory Visit: Payer: Self-pay | Admitting: Internal Medicine

## 2020-02-02 DIAGNOSIS — G4730 Sleep apnea, unspecified: Secondary | ICD-10-CM | POA: Diagnosis not present

## 2020-02-02 DIAGNOSIS — F4312 Post-traumatic stress disorder, chronic: Secondary | ICD-10-CM | POA: Diagnosis not present

## 2020-02-02 DIAGNOSIS — F339 Major depressive disorder, recurrent, unspecified: Secondary | ICD-10-CM | POA: Diagnosis not present

## 2020-02-08 ENCOUNTER — Telehealth: Payer: Self-pay | Admitting: *Deleted

## 2020-02-08 DIAGNOSIS — R942 Abnormal results of pulmonary function studies: Secondary | ICD-10-CM

## 2020-02-08 NOTE — Telephone Encounter (Signed)
Informed patient that due to abnormal PFTs, a referral has been placed to pulmonary per Dr. [name].  He is aware he will be contacted to schedule.

## 2020-02-08 NOTE — Telephone Encounter (Signed)
DMV form and PFT report copied for chart and handed to patient.

## 2020-02-17 ENCOUNTER — Other Ambulatory Visit: Payer: Self-pay | Admitting: Family Medicine

## 2020-02-22 ENCOUNTER — Other Ambulatory Visit: Payer: Self-pay | Admitting: Family Medicine

## 2020-03-15 ENCOUNTER — Other Ambulatory Visit: Payer: Self-pay | Admitting: Family Medicine

## 2020-03-16 ENCOUNTER — Telehealth (INDEPENDENT_AMBULATORY_CARE_PROVIDER_SITE_OTHER): Payer: Federal, State, Local not specified - PPO | Admitting: Family Medicine

## 2020-03-16 DIAGNOSIS — U071 COVID-19: Secondary | ICD-10-CM | POA: Diagnosis not present

## 2020-03-16 MED ORDER — PREDNISONE 10 MG PO TABS: ORAL_TABLET | ORAL | 0 refills | Status: DC

## 2020-03-16 MED ORDER — TOPIRAMATE 50 MG PO TABS
50.0000 mg | ORAL_TABLET | Freq: Two times a day (BID) | ORAL | 11 refills | Status: DC
Start: 2020-03-16 — End: 2021-05-28

## 2020-03-16 MED ORDER — BENZONATATE 100 MG PO CAPS
100.0000 mg | ORAL_CAPSULE | Freq: Three times a day (TID) | ORAL | 0 refills | Status: DC | PRN
Start: 2020-03-16 — End: 2020-04-13

## 2020-03-16 NOTE — Progress Notes (Signed)
Patient ID: Scott Walter, male   DOB: [birth year], 50 y.o.   MRN: [id number]   This visit type was conducted due to national recommendations for restrictions regarding the COVID-19 pandemic in an effort to limit this patient's exposure and mitigate transmission in our community.   Virtual Visit via Video Note  I connected with Scott Walter on 03/16/20 at  1:45 PM EST by a video enabled telemedicine application and verified that I am speaking with the correct person using two identifiers.  Location patient: home Location provider:work or home office Persons participating in the virtual visit: patient, provider  I discussed the limitations of evaluation and management by telemedicine and the availability of in person appointments. The patient expressed understanding and agreed to proceed.   HPI: Scott Walter called with diagnosis of Covid from testing on 03/03/2020.  He states he has not had fever now for 3 days but still has some persistent fatigue, malaise, body aches, cough, dyspnea.  Does not have pulse oximeter.  Was not vaccinated.  No chest pains.  No pleuritic pain.  Denies any asymmetric leg edema or calf edema.  Does have history of reactive airways and feels he may be wheezing some.  Requesting Tessalon Perles.  He has albuterol inhaler.  Has used prednisone in the past.  History of migraine headaches.  Currently on Topamax which seems to be helping.  He has increased this up to 50 mg twice daily and requesting refills   ROS: See pertinent positives and negatives per HPI.  Past Medical History:  Diagnosis Date  . Allergy   . Anxiety   . Bipolar disorder ([place])    h/o SI  . Chronic insomnia   . Chronic lower back pain   . Depression   . Dyslipidemia   . Essential hypertension   . GERD (gastroesophageal reflux disease)   . ZWCHENID(782.4)    "monthly" (01/26/2013)  . History of stomach ulcers ~ 2009  . Midsternal chest pain    a. 06/2014 Myoview: EF 50%, no ischemia/infarct.  .  Migraine    "maybe 2 in my lifetime" (01/26/2013)  . PAF (paroxysmal atrial fibrillation) ([name])    a. Dx 05/2012;  b. 06/2014 Xarelto started in setting of ? TIA (CHA2DS2VASc = 3);  c. 06/2014 Echo: EF 60-65%, no rwma.  [name] PTSD (post-traumatic stress disorder)   . Sleep apnea   . Stroke [facility])     Past Surgical History:  Procedure Laterality Date  . EAR CANALOPLASTY    . KNEE ARTHROSCOPY Right 09/2011  . KNEE SURGERY  02/2017   right knee  . SHOULDER SURGERY    . VASECTOMY  09/2017    Family History  Problem Relation Age of Onset  . CAD Mother   . Diabetes Mother   . Breast cancer Mother   . Emphysema Father   . Healthy Sister   . Healthy Brother   . Healthy Sister   . Healthy Sister   . Healthy Sister   . Healthy Sister   . Healthy Sister   . Healthy Sister   . Healthy Sister   . Healthy Brother   . Healthy Brother   . Healthy Brother   . HIV Brother   . Colon cancer Maternal Grandfather   . Esophageal cancer Neg Hx   . Stomach cancer Neg Hx   . Pancreatic cancer Neg Hx     SOCIAL HX: former smoker.    Current Outpatient Medications:  .  benzonatate (TESSALON PERLES) 100 MG  capsule, Take 1 capsule (100 mg total) by mouth 3 (three) times daily as needed for cough., Disp: 30 capsule, Rfl: 0 .  predniSONE (DELTASONE) 10 MG tablet, Taper as follows: 4-4-4-3-3-2-2-1-1, Disp: 24 tablet, Rfl: 0 .  topiramate (TOPAMAX) 50 MG tablet, Take 1 tablet (50 mg total) by mouth 2 (two) times daily., Disp: 60 tablet, Rfl: 11 .  albuterol (PROVENTIL HFA;VENTOLIN HFA) 108 (90 Base) MCG/ACT inhaler, Inhale 2 puffs into the lungs every 6 (six) hours as needed for wheezing or shortness of breath., Disp: 1 Inhaler, Rfl: 2 .  amLODipine (NORVASC) 2.5 MG tablet, TAKE 1 TABLET BY MOUTH EVERY DAY, Disp: 90 tablet, Rfl: 3 .  Artificial Tear Ointment (DRY EYES OP), Apply to eye., Disp: , Rfl:  .  atenolol (TENORMIN) 25 MG tablet, Take 1 tablet (25 mg total) by mouth daily., Disp: 90 tablet, Rfl:  3 .  cetirizine (ZYRTEC) 10 MG tablet, Take 10 mg by mouth daily., Disp: , Rfl:  .  Diclofenac Sodium (PENNSAID) 2 % SOLN, Pennsaid 20 mg/gram/actuation (2 %) topical soln in metered-dose pump  APPLY 2 PUMPS (40 MG) TO THE AFFECTED AREA BY TOPICAL ROUTE 2 TIMES PER DAY, Disp: , Rfl:  .  ergocalciferol (VITAMIN D2) 1.25 MG (50000 UT) capsule, Take 50,000 Units by mouth once a week. , Disp: , Rfl:  .  flecainide (TAMBOCOR) 50 MG tablet, TAKE 1.5 TABLETS (75 MG TOTAL) BY MOUTH 2 (TWO) TIMES DAILY., Disp: 270 tablet, Rfl: 2 .  fluticasone (FLONASE) 50 MCG/ACT nasal spray, Place 1 spray into both nostrils daily., Disp: 16 g, Rfl: 2 .  hydrOXYzine (ATARAX/VISTARIL) 25 MG tablet, TAKE ONE TO TWO AT NIGHT AS NEEDED FOR INSOMNIA., Disp: 180 tablet, Rfl: 0 .  Melatonin 10 MG CAPS, Take 3 capsules by mouth at bedtime as needed., Disp: , Rfl:  .  omeprazole (PRILOSEC) 20 MG capsule, Take 1 capsule (20 mg total) by mouth 2 (two) times daily before a meal., Disp: 90 capsule, Rfl: 3 .  sertraline (ZOLOFT) 100 MG tablet, Take 50 mg by mouth daily., Disp: , Rfl:  .  triamcinolone cream (KENALOG) 0.1 %, Apply 1 application topically 2 (two) times daily as needed., Disp: 80 g, Rfl: 2 .  XARELTO 20 MG TABS tablet, TAKE 1 TABLET BY MOUTH EVERY DAY WITH SUPPER, Disp: 90 tablet, Rfl: 1  EXAM:  VITALS per patient if applicable:  GENERAL: alert, oriented, appears well and in no acute distress  HEENT: atraumatic, conjunttiva clear, no obvious abnormalities on inspection of external nose and ears  NECK: normal movements of the head and neck  LUNGS: on inspection no signs of respiratory distress, breathing rate appears normal, no obvious gross SOB, gasping or wheezing  CV: no obvious cyanosis  MS: moves all visible extremities without noticeable abnormality  PSYCH/NEURO: pleasant and cooperative, no obvious depression or anxiety, speech and thought processing grossly intact  ASSESSMENT AND PLAN:  Discussed the  following assessment and plan:  # Covid 19 infection.  Patient complains of some dyspnea.  Does not have pulse oximeter for monitoring at home.  He is past 10-day window for monoclonal antibodies.  -Recommend home pulse oximeter and consider hospital evaluation if O2 sats dropping below 90% -Plenty fluids and rest -Tessalon Perles 100 mg every 8 hours as needed for cough -Send in prednisone taper over the next week and continue albuterol as needed  #2 history of chronic headaches improved on Topamax  -Refill Topamax 50 mg twice daily  I discussed the assessment and treatment plan with the patient. The patient was provided an opportunity to ask questions and all were answered. The patient agreed with the plan and demonstrated an understanding of the instructions.   The patient was advised to call back or seek an in-person evaluation if the symptoms worsen or if the condition fails to improve as anticipated.     Carolann Littler, MD

## 2020-04-12 ENCOUNTER — Other Ambulatory Visit: Payer: Self-pay | Admitting: Family Medicine

## 2020-04-13 NOTE — Telephone Encounter (Signed)
Confirm if patient has been taking the Topamax.  He was supposed to follow-up in December to reassess headaches after starting the Topamax in November

## 2020-04-13 NOTE — Telephone Encounter (Signed)
Left message for patient to call and schedule follow up and to confirm if he is taking topamax.

## 2020-04-18 ENCOUNTER — Other Ambulatory Visit: Payer: Self-pay

## 2020-04-18 ENCOUNTER — Encounter: Payer: Self-pay | Admitting: Emergency Medicine

## 2020-04-18 ENCOUNTER — Ambulatory Visit (INDEPENDENT_AMBULATORY_CARE_PROVIDER_SITE_OTHER): Payer: Federal, State, Local not specified - PPO

## 2020-04-18 ENCOUNTER — Ambulatory Visit: Payer: Federal, State, Local not specified - PPO | Admitting: Emergency Medicine

## 2020-04-18 VITALS — BP 112/74 | HR 75 | Temp 98.5°F | Ht 69.0 in | Wt 241.4 lb

## 2020-04-18 DIAGNOSIS — R0602 Shortness of breath: Secondary | ICD-10-CM

## 2020-04-18 DIAGNOSIS — J984 Other disorders of lung: Secondary | ICD-10-CM

## 2020-04-18 NOTE — Patient Instructions (Signed)
Your pulmonary function testing shows evidence for restriction of the size of your breath.  This can contribute to short windedness.  The most likely cause is carrying extra weight.  Congratulations on your exercise routine and weight loss.  Keep up the good work and try to continue to lose. We will check a chest x-ray today to ensure no other factors that could contribute to restriction Follow-up with Dr. Lamonte Sakai in 3 months with a repeat chest x-ray.

## 2020-04-18 NOTE — Assessment & Plan Note (Signed)
Reviewed his pulmonary function testing.  He does have restriction, TLC 69% predicted, little change from 2015.  His diffusion capacity is normal, actually elevated for his alveolar volume.  I do not see any evidence for obstruction.  I suspect that his weight is the principal contributor.  He has had some improvement in breathing since he is lost over 50 pounds although he still has exertional dyspnea depending on the activity.  I think he needs a chest x-ray to ensure no evidence for hemidiaphragmatic paralysis, no evidence for interstitial disease.  Doubt that this is related to Covid as his PFT were performed prior to the illness.  That being said we will repeat his chest x-ray in 3 months to ensure no evolving changes.  Your pulmonary function testing shows evidence for restriction of the size of your breath.  This can contribute to short windedness.  The most likely cause is carrying extra weight.  Congratulations on your exercise routine and weight loss.  Keep up the good work and try to continue to lose. We will check a chest x-ray today to ensure no other factors that could contribute to restriction Follow-up with Scott Walter in 3 months with a repeat chest x-ray.

## 2020-04-18 NOTE — Progress Notes (Signed)
Subjective:    Patient ID: Scott Walter, male    DOB: 10/13/1970, 50 y.o.   MRN: [id number]  HPI 50 year old gentleman, former smoker (25 pack years) with a history of atrial fibrillation on anticoagulation, TIA/stroke, bipolar disease, hypertension, GERD/PUD, migraines. He was diagnosed with COVID-19 03/03/2020, was not admitted, treated as an outpatient symptomatically. He is referred today for evaluation of shortness of breath and abnormal pulmonary function testing. He is working on wt loss and has lost 50 lbs since October. He still has exertional SOB especially an incline or stairs.   He reports exertional dyspnea, occasional wheezing.  He underwent PFT initially to allow him to reobtain his driver's license and use an interlock system.  Pulmonary function testing performed on 03/29/2019 reviewed by me, shows evidence for restriction on spirometry without a bronchodilator response, restriction confirmed by lung volumes with TLC 69% predicted, normal diffusion capacity.   Review of Systems As per HPI  Past Medical History:  Diagnosis Date   Allergy    Anxiety    Bipolar disorder ([place])    h/o SI   Chronic insomnia    Chronic lower back pain    Depression    Dyslipidemia    Essential hypertension    GERD (gastroesophageal reflux disease)    Headache(784.0)    "monthly" (01/26/2013)   History of stomach ulcers ~ 2009   Midsternal chest pain    a. 06/2014 Myoview: EF 50%, no ischemia/infarct.   Migraine    "maybe 2 in my lifetime" (01/26/2013)   PAF (paroxysmal atrial fibrillation) ([place])    a. Dx 05/2012;  b. 06/2014 Xarelto started in setting of ? TIA (CHA2DS2VASc = 3);  c. 06/2014 Echo: EF 60-65%, no rwma.   PTSD (post-traumatic stress disorder)    Sleep apnea    Stroke [facility])      Family History  Problem Relation Age of Onset   CAD Mother    Diabetes Mother    Breast cancer Mother    Emphysema Father    Healthy Sister    Healthy Brother     Healthy Sister    Healthy Sister    Healthy Sister    Healthy Sister    Healthy Sister    Healthy Sister    Healthy Sister    Healthy Brother    Healthy Brother    Healthy Brother    HIV Brother    Colon cancer Maternal Grandfather    Esophageal cancer Neg Hx    Stomach cancer Neg Hx    Pancreatic cancer Neg Hx      Social History   Socioeconomic History   Marital status: Married    Spouse name: Not on file   Number of children: Not on file   Years of education: Not on file   Highest education level: Not on file  Occupational History   Not on file  Tobacco Use   Smoking status: Former Smoker    Packs/day: 0.75    Years: 30.00    Pack years: 22.50    Types: Cigarettes    Quit date: 03/07/2017    Years since quitting: 3.1   Smokeless tobacco: Former Systems developer    Types: Chew   Tobacco comment: 01/26/2013 "quit chewing > 20 yr ago"  Vaping Use   Vaping Use: Former  Substance and Sexual Activity   Alcohol use: Never    Comment: occ   Drug use: No   Sexual activity: Not on file  Other Topics Concern  Not on file  Social History Narrative   Not on file   Social Determinants of Health   Financial Resource Strain: Not on file  Food Insecurity: Not on file  Transportation Needs: Not on file  Physical Activity: Not on file  Stress: Not on file  Social Connections: Not on file  Intimate Partner Violence: Not on file    Works as a Primary school teacher.  Has driven for [employer]  Has lived in [place], [place] Was in [place], worked as an [employer].    Allergies  Allergen Reactions   Ambien [Zolpidem Tartrate] Other (See Comments)    "Nightmares and mood swings   Divalproex Sodium Anaphylaxis, Shortness Of Breath and Swelling   Valproic Acid Anaphylaxis and Swelling   Zolpidem Anaphylaxis     Outpatient Medications Prior to Visit  Medication Sig Dispense Refill   albuterol (PROVENTIL HFA;VENTOLIN HFA) 108 (90 Base) MCG/ACT inhaler Inhale 2 puffs into the lungs  every 6 (six) hours as needed for wheezing or shortness of breath. 1 Inhaler 2   amLODipine (NORVASC) 2.5 MG tablet TAKE 1 TABLET BY MOUTH EVERY DAY 90 tablet 3   Artificial Tear Ointment (DRY EYES OP) Apply to eye.     atenolol (TENORMIN) 25 MG tablet Take 1 tablet (25 mg total) by mouth daily. 90 tablet 3   cetirizine (ZYRTEC) 10 MG tablet Take 10 mg by mouth daily.     Diclofenac Sodium (PENNSAID) 2 % SOLN Pennsaid 20 mg/gram/actuation (2 %) topical soln in metered-dose pump  APPLY 2 PUMPS (40 MG) TO THE AFFECTED AREA BY TOPICAL ROUTE 2 TIMES PER DAY     ergocalciferol (VITAMIN D2) 1.25 MG (50000 UT) capsule Take 50,000 Units by mouth once a week.      flecainide (TAMBOCOR) 50 MG tablet TAKE 1.5 TABLETS (75 MG TOTAL) BY MOUTH 2 (TWO) TIMES DAILY. 270 tablet 2   fluticasone (FLONASE) 50 MCG/ACT nasal spray Place 1 spray into both nostrils daily. 16 g 2   hydrOXYzine (ATARAX/VISTARIL) 25 MG tablet TAKE ONE TO TWO AT NIGHT AS NEEDED FOR INSOMNIA. 180 tablet 0   Melatonin 10 MG CAPS Take 3 capsules by mouth at bedtime as needed.     omeprazole (PRILOSEC) 20 MG capsule Take 1 capsule (20 mg total) by mouth 2 (two) times daily before a meal. 90 capsule 3   sertraline (ZOLOFT) 100 MG tablet Take 50 mg by mouth daily.     topiramate (TOPAMAX) 50 MG tablet Take 1 tablet (50 mg total) by mouth 2 (two) times daily. 60 tablet 11   triamcinolone cream (KENALOG) 0.1 % Apply 1 application topically 2 (two) times daily as needed. 80 g 2   XARELTO 20 MG TABS tablet TAKE 1 TABLET BY MOUTH EVERY DAY WITH SUPPER 90 tablet 1   benzonatate (TESSALON) 100 MG capsule TAKE 1 CAPSULE BY MOUTH THREE TIMES A DAY AS NEEDED FOR COUGH 30 capsule 0   predniSONE (DELTASONE) 10 MG tablet Taper as follows: 4-4-4-3-3-2-2-1-1 24 tablet 0   No facility-administered medications prior to visit.         Objective:   Physical Exam Vitals:   04/18/20 1527  BP: 112/74  Pulse: 75  Temp: 98.5 F (36.9 C)   TempSrc: Temporal  SpO2: 97%  Weight: 241 lb 6.4 oz (109.5 kg)  Height: 5\' 9"  (1.753 m)   Gen: Pleasant, well-nourished, in no distress,  normal affect  ENT: No lesions,  mouth clear,  oropharynx clear, no postnasal drip  Neck: No  JVD, no stridor  Lungs: No use of accessory muscles, no crackles or wheezing on normal respiration, no wheeze on forced expiration  Cardiovascular: RRR, heart sounds normal, no murmur or gallops, no peripheral edema  Musculoskeletal: No deformities, no cyanosis or clubbing  Neuro: alert, awake, non focal  Skin: Warm, no lesions or rash      Assessment & Plan:  Restrictive lung disease Reviewed his pulmonary function testing.  He does have restriction, TLC 69% predicted, little change from 2015.  His diffusion capacity is normal, actually elevated for his alveolar volume.  I do not see any evidence for obstruction.  I suspect that his weight is the principal contributor.  He has had some improvement in breathing since he is lost over 50 pounds although he still has exertional dyspnea depending on the activity.  I think he needs a chest x-ray to ensure no evidence for hemidiaphragmatic paralysis, no evidence for interstitial disease.  Doubt that this is related to Covid as his PFT were performed prior to the illness.  That being said we will repeat his chest x-ray in 3 months to ensure no evolving changes.  Your pulmonary function testing shows evidence for restriction of the size of your breath.  This can contribute to short windedness.  The most likely cause is carrying extra weight.  Congratulations on your exercise routine and weight loss.  Keep up the good work and try to continue to lose. We will check a chest x-ray today to ensure no other factors that could contribute to restriction Follow-up with Dr. Lamonte Sakai in 3 months with a repeat chest x-ray.  Baltazar Apo, MD, PhD 04/18/2020, 4:14 PM [facility] [phone] or if no  answer before 7:00PM call [phone] For any issues after 7:00PM please call eLink [phone]

## 2020-04-23 DIAGNOSIS — M2241 Chondromalacia patellae, right knee: Secondary | ICD-10-CM | POA: Diagnosis not present

## 2020-05-31 DIAGNOSIS — M2241 Chondromalacia patellae, right knee: Secondary | ICD-10-CM | POA: Diagnosis not present

## 2020-05-31 DIAGNOSIS — M25561 Pain in right knee: Secondary | ICD-10-CM | POA: Diagnosis not present

## 2020-06-23 DIAGNOSIS — M7651 Patellar tendinitis, right knee: Secondary | ICD-10-CM | POA: Diagnosis not present

## 2020-07-02 DIAGNOSIS — M2241 Chondromalacia patellae, right knee: Secondary | ICD-10-CM | POA: Diagnosis not present

## 2020-07-05 ENCOUNTER — Telehealth: Payer: Self-pay

## 2020-07-05 NOTE — Telephone Encounter (Signed)
   [facility] Pre-operative Risk Assessment    Patient Name: Scott Walter  DOB: 1970-04-13  MRN: [id number]   HEARTCARE STAFF: - Please ensure there is not already an duplicate clearance open for this procedure. - Under Visit Info/Reason for Call, type in Other and utilize the format Clearance MM/DD/YY or Clearance TBD. Do not use dashes or single digits. - If request is for dental extraction, please clarify the # of teeth to be extracted.  Request for surgical clearance:  1. What type of surgery is being performed? RIGHT KNEE OPEN OSTEOCHONDRIAL AUTOGRAFT TRANSFER, AUTO GRAFT OF PATELLA   2. When is this surgery scheduled? 07-23-20   3. What type of clearance is required (medical clearance vs. Pharmacy clearance to hold med vs. Both)? BOTH  4. Are there any medications that need to be held prior to surgery and how long? XARELTO BUT HAS TAKEN HIMSELF OFF OF THIS. PLEASE ADVISE ON XARELTO.   5. Practice name and name of physician performing surgery? [facility]   6. What is the office phone number? [phone]   7.   What is the office fax number? [place]  8.   Anesthesia type (None, local, MAC, general) ? CHOICE   Scott Walter 07/05/2020, 5:19 PM  _________________________________________________________________   (provider comments below)

## 2020-07-06 ENCOUNTER — Encounter: Payer: Self-pay | Admitting: Internal Medicine

## 2020-07-06 ENCOUNTER — Other Ambulatory Visit: Payer: Self-pay

## 2020-07-06 ENCOUNTER — Ambulatory Visit: Payer: Federal, State, Local not specified - PPO | Admitting: Internal Medicine

## 2020-07-06 VITALS — BP 140/90 | HR 69 | Ht 69.0 in | Wt 246.8 lb

## 2020-07-06 DIAGNOSIS — I10 Essential (primary) hypertension: Secondary | ICD-10-CM | POA: Diagnosis not present

## 2020-07-06 DIAGNOSIS — I480 Paroxysmal atrial fibrillation: Secondary | ICD-10-CM | POA: Diagnosis not present

## 2020-07-06 NOTE — Progress Notes (Signed)
Cardiology Office Note   Date:  07/06/2020   ID:  Scott Walter, DOB May 01, 1970, MRN [id number]  PCP:  Eulas Post, MD  Cardiologist:   Dorris Carnes, MD    Follow-up of PAF and hypertension   History of Present Illness: Scott Walter is a 50 y.o. male with a history ofPAF, HTN, ? TIA, bipolar disorder   The pt has had  DOE in past   IN 2020 underwent Lexiscan myovue   This was normal    Event monitor also done which showed no afib    I saw the pt in November 2021  He is scheduled to have surgery on knee   Presentsfor preop evaluation   HE denies palpitaitions  Breathing is OK   No dizziness   He says his BP is better at home   130s    Due to have R knee surgery on 6/6  Stopped Eliquis    No outpatient medications have been marked as taking for the 07/06/20 encounter (Office Visit) with Fay Records, MD.     Allergies:   Ambien [zolpidem tartrate], Divalproex sodium, No known allergies, Valproic acid, Zolpidem, and Aspirin   Past Medical History:  Diagnosis Date  . Allergy   . Anxiety   . Bipolar disorder ([place])    h/o SI  . Chronic insomnia   . Chronic lower back pain   . Depression   . Dyslipidemia   . Essential hypertension   . GERD (gastroesophageal reflux disease)   . MPNTIRWE(315.4)    "monthly" (01/26/2013)  . History of stomach ulcers ~ 2009  . Midsternal chest pain    a. 06/2014 Myoview: EF 50%, no ischemia/infarct.  . Migraine    "maybe 2 in my lifetime" (01/26/2013)  . PAF (paroxysmal atrial fibrillation) ([place])    a. Dx 05/2012;  b. 06/2014 Xarelto started in setting of ? TIA (CHA2DS2VASc = 3);  c. 06/2014 Echo: EF 60-65%, no rwma.  [name] PTSD (post-traumatic stress disorder)   . Sleep apnea   . Stroke [facility])     Past Surgical History:  Procedure Laterality Date  . EAR CANALOPLASTY    . KNEE ARTHROSCOPY Right 09/2011  . KNEE SURGERY  02/2017   right knee  . SHOULDER SURGERY    . VASECTOMY  09/2017     Social History:  The patient  reports that he quit  smoking about 3 years ago. His smoking use included cigarettes. He has a 22.50 pack-year smoking history. He has quit using smokeless tobacco.  His smokeless tobacco use included chew. He reports that he does not drink alcohol and does not use drugs.   Family History:  The patient's family history includes Breast cancer in his mother; CAD in his mother; Colon cancer in his maternal grandfather; Diabetes in his mother; Emphysema in his father; HIV in his brother; Healthy in his brother, brother, brother, brother, sister, sister, sister, sister, sister, sister, sister, and sister.    ROS:  Please see the history of present illness. All other systems are reviewed and  Negative to the above problem except as noted.    PHYSICAL EXAM: VS:  BP 140/90   Pulse 69   Ht 5\' 9"  (1.753 m)   Wt 246 lb 12.8 oz (111.9 kg)   SpO2 97%   BMI 36.45 kg/m   GEN: Morbidly obese 50 year old, in no acute distress  HEENT: normal  Neck: JVP is normal   Cardiac: RRR; no murmurs,,no lower extremity edema  Respiratory:  clear to auscultation bilaterally GI: soft, nontender, nondistended, + BS  No hepatomegaly  MS: no deformity Moving all extremities   Skin: warm and dry, tattoos. Neuro: Cranial nerves II through XII intact Psych: euthymic mood, full affect   EKG:  EKG is ordered today.  Sinus rhythm.  69 bpm  Nonspecific ST changes     Lipid Panel    Component Value Date/Time   CHOL 180 03/12/2018 0000   TRIG 134 03/12/2018 0000   HDL 41.10 07/28/2016 1546   CHOLHDL 4 07/28/2016 1546   VLDL 37.4 07/28/2016 1546   LDLCALC 105 (H) 07/28/2016 1546      Wt Readings from Last 3 Encounters:  07/06/20 246 lb 12.8 oz (111.9 kg)  04/18/20 241 lb 6.4 oz (109.5 kg)  01/02/20 258 lb 3.2 oz (117.1 kg)      ASSESSMENT AND PLAN:  1.  PAF doing well. CHA2DS2VASc is 1   No recent spells   Follow  COntinue b blocker and flecanide as needed  On Xarelto though he has held until surgery    2.  Hypertension.  Keep  on current regimen  He says bp is better at home   Bring in cuff to next visit  3 pulmonary.  Moving air well  No wheezes  4  Preop eval   Pt being evaluated for knee surgery    From a cardiac standpoint I feel he is at low risk for major cardiac event and OK to proceed     Encourage the patient to stay active.  He does have joint issues but has been able to tolerate exercise at home.  Again diet discussed.  We will set to see the patient back next summer, sooner with problems.     Current medicines are reviewed at length with the patient today.  The patient does not have concerns regarding medicines.  Signed, Dorris Carnes, MD  07/06/2020 10:51 PM    [facility] [address], [place], [place]  [address] Phone: [phone]; Fax: [phone]

## 2020-07-06 NOTE — Telephone Encounter (Signed)
Pt has appt today with Dr. [name]. I will send pre op clearance notes to MD for appt today

## 2020-07-06 NOTE — Telephone Encounter (Signed)
Patient with diagnosis of A Fib on Xarelto for anticoagulation.    1. Procedure: RIGHT KNEE OPEN OSTEOCHONDRIAL AUTOGRAFT TRANSFER, AUTO GRAFT OF PATELLA   Date of procedure: 07/23/20   CHA2DS2-VASc Score = 3  This indicates a 3.2% annual risk of stroke. The patient's score is based upon: CHF History: No HTN History: Yes Diabetes History: No Stroke History: Yes Vascular Disease History: No Age Score: 0 Gender Score: 0   CrCl 127 mL/min Platelet count 285K  Typically hold Xarelto for 3 days for orthopedic procedure but due to possible history of TIA/stroke, will route to Dr [name] for input

## 2020-07-06 NOTE — Telephone Encounter (Signed)
Primary Cardiologist:Paula [name], MD  Chart reviewed as part of pre-operative protocol coverage. Because of Scott Walter past medical history and time since last visit, he/she will require a follow-up visit in order to better assess preoperative cardiovascular risk.  Pre-op covering staff: - Please schedule appointment and call patient to inform them. - Please contact requesting surgeon's office via preferred method (i.e, phone, fax) to inform them of need for appointment prior to surgery.  If applicable, this message will also be routed to pharmacy pool and/or primary cardiologist for input on holding anticoagulant/antiplatelet agent as requested below so that this information is available at time of patient's appointment.   Deberah Pelton, NP  07/06/2020, 9:03 AM

## 2020-07-06 NOTE — Patient Instructions (Signed)
Medication Instructions:  No changes *If you need a refill on your cardiac medications before your next appointment, please call your pharmacy*   Lab Work: Today: lipids/bmet/cbc If you have labs (blood work) drawn today and your tests are completely normal, you will receive your results only by: [name] MyChart Message (if you have MyChart) OR . A paper copy in the mail If you have any lab test that is abnormal or we need to change your treatment, we will call you to review the results.   Testing/Procedures: none   Follow-Up: At [facility], you and your health needs are our priority.  As part of our continuing mission to provide you with exceptional heart care, we have created designated Provider Care Teams.  These Care Teams include your primary Cardiologist (physician) and Advanced Practice Providers (APPs -  Physician Assistants and Nurse Practitioners) who all work together to provide you with the care you need, when you need it.    Your next appointment:   10 month(s) (March 2023)  The format for your next appointment:   In Person  Provider:   You may see Dorris Carnes, MD or one of the following Advanced Practice Providers on your designated Care Team:    Richardson Dopp, PA-C  Robbie Lis, [place]    Other Instructions

## 2020-07-07 LAB — BASIC METABOLIC PANEL
BUN/Creatinine Ratio: 12 (ref 9–20)
BUN: 12 mg/dL (ref 6–24)
CO2: 19 mmol/L — ABNORMAL LOW (ref 20–29)
Calcium: 9.3 mg/dL (ref 8.7–10.2)
Chloride: 108 mmol/L — ABNORMAL HIGH (ref 96–106)
Creatinine, Ser: 1 mg/dL (ref 0.76–1.27)
Glucose: 90 mg/dL (ref 65–99)
Potassium: 4.6 mmol/L (ref 3.5–5.2)
Sodium: 142 mmol/L (ref 134–144)
eGFR: 92 mL/min/{1.73_m2} (ref >59)

## 2020-07-07 LAB — CBC
Hematocrit: 47.8 % (ref 37.5–51.0)
Hemoglobin: 15.9 g/dL (ref 13.0–17.7)
MCH: 30.6 pg (ref 26.6–33.0)
MCHC: 33.3 g/dL (ref 31.5–35.7)
MCV: 92 fL (ref 79–97)
Platelets: 208 10*3/uL (ref 150–450)
RBC: 5.19 x10E6/uL (ref 4.14–5.80)
RDW: 13.8 % (ref 11.6–15.4)
WBC: 7.5 10*3/uL (ref 3.4–10.8)

## 2020-07-07 LAB — LIPID PANEL
Chol/HDL Ratio: 4.1 ratio (ref 0.0–5.0)
Cholesterol, Total: 181 mg/dL (ref 100–199)
HDL: 44 mg/dL (ref >39)
LDL Chol Calc (NIH): 115 mg/dL — ABNORMAL HIGH (ref 0–99)
Triglycerides: 125 mg/dL (ref 0–149)
VLDL Cholesterol Cal: 22 mg/dL (ref 5–40)

## 2020-07-23 DIAGNOSIS — M958 Other specified acquired deformities of musculoskeletal system: Secondary | ICD-10-CM | POA: Diagnosis not present

## 2020-07-23 DIAGNOSIS — M25361 Other instability, right knee: Secondary | ICD-10-CM | POA: Diagnosis not present

## 2020-07-23 DIAGNOSIS — M67861 Other specified disorders of synovium, right knee: Secondary | ICD-10-CM | POA: Diagnosis not present

## 2020-07-23 DIAGNOSIS — M794 Hypertrophy of (infrapatellar) fat pad: Secondary | ICD-10-CM | POA: Diagnosis not present

## 2020-07-23 DIAGNOSIS — M2241 Chondromalacia patellae, right knee: Secondary | ICD-10-CM | POA: Diagnosis not present

## 2020-07-23 DIAGNOSIS — G8918 Other acute postprocedural pain: Secondary | ICD-10-CM | POA: Diagnosis not present

## 2020-07-23 DIAGNOSIS — T8489XA Other specified complication of internal orthopedic prosthetic devices, implants and grafts, initial encounter: Secondary | ICD-10-CM | POA: Diagnosis not present

## 2020-07-23 DIAGNOSIS — M94261 Chondromalacia, right knee: Secondary | ICD-10-CM | POA: Diagnosis not present

## 2020-07-23 DIAGNOSIS — M24661 Ankylosis, right knee: Secondary | ICD-10-CM | POA: Diagnosis not present

## 2020-08-03 DIAGNOSIS — M25561 Pain in right knee: Secondary | ICD-10-CM | POA: Diagnosis not present

## 2020-08-18 ENCOUNTER — Other Ambulatory Visit: Payer: Self-pay | Admitting: Internal Medicine

## 2020-08-21 MED ORDER — FLECAINIDE ACETATE 50 MG PO TABS: 75.0000 mg | ORAL_TABLET | Freq: Two times a day (BID) | ORAL | 3 refills | Status: DC

## 2020-10-08 ENCOUNTER — Encounter: Payer: Self-pay | Admitting: Family Medicine

## 2020-10-10 ENCOUNTER — Other Ambulatory Visit: Payer: Self-pay

## 2020-10-10 ENCOUNTER — Telehealth (INDEPENDENT_AMBULATORY_CARE_PROVIDER_SITE_OTHER): Payer: Federal, State, Local not specified - PPO | Admitting: Family Medicine

## 2020-10-10 DIAGNOSIS — U071 COVID-19: Secondary | ICD-10-CM | POA: Diagnosis not present

## 2020-10-10 MED ORDER — MOLNUPIRAVIR EUA 200MG CAPSULE
4.0000 | ORAL_CAPSULE | Freq: Two times a day (BID) | ORAL | 0 refills | Status: AC
Start: 2020-10-10 — End: 2020-10-15

## 2020-10-10 NOTE — Progress Notes (Signed)
Patient ID: Scott Walter, male   DOB: 10-03-1970, 50 y.o.   MRN: [id number]  This visit type was conducted due to national recommendations for restrictions regarding the COVID-19 pandemic in an effort to limit this patient's exposure and mitigate transmission in our community.   Virtual Visit via Video Note  I connected with Scott Walter on 10/10/20 at  5:00 PM EDT by a video enabled telemedicine application and verified that I am speaking with the correct person using two identifiers.  Location patient: home Location provider:work or home office Persons participating in the virtual visit: patient, provider  I discussed the limitations of evaluation and management by telemedicine and the availability of in person appointments. The patient expressed understanding and agreed to proceed.   HPI:  Scott Walter has COVID-19 infection.  He states this to be the third time he had COVID.  Last infection was last January.  He was very sick for about 3 weeks then.  Current symptoms started on Sunday.  His wife was diagnosed last week with COVID.  He tested positive yesterday but negative on Monday.  He has had fever of 101, body aches, cough, mild dyspnea.  He has multiple chronic problems including history of TIA, atrial fibrillation, hypertension, obstructive sleep apnea, restrictive lung disease.  No chest pains.  Has had occasional diarrhea.  ROS: See pertinent positives and negatives per HPI.  Past Medical History:  Diagnosis Date   Allergy    Anxiety    Bipolar disorder ([place])    h/o SI   Chronic insomnia    Chronic lower back pain    Depression    Dyslipidemia    Essential hypertension    GERD (gastroesophageal reflux disease)    Headache(784.0)    "monthly" (01/26/2013)   History of stomach ulcers ~ 2009   Midsternal chest pain    a. 06/2014 Myoview: EF 50%, no ischemia/infarct.   Migraine    "maybe 2 in my lifetime" (01/26/2013)   PAF (paroxysmal atrial fibrillation) ([place])    a. Dx 05/2012;   b. 06/2014 Xarelto started in setting of ? TIA (CHA2DS2VASc = 3);  c. 06/2014 Echo: EF 60-65%, no rwma.   PTSD (post-traumatic stress disorder)    Sleep apnea    Stroke [facility])     Past Surgical History:  Procedure Laterality Date   EAR CANALOPLASTY     KNEE ARTHROSCOPY Right 09/2011   KNEE SURGERY  02/2017   right knee   SHOULDER SURGERY     VASECTOMY  09/2017    Family History  Problem Relation Age of Onset   CAD Mother    Diabetes Mother    Breast cancer Mother    Emphysema Father    Healthy Sister    Healthy Brother    Healthy Sister    Healthy Sister    Healthy Sister    Healthy Sister    Healthy Sister    Healthy Sister    Healthy Sister    Healthy Brother    Healthy Brother    Healthy Brother    HIV Brother    Colon cancer Maternal Grandfather    Esophageal cancer Neg Hx    Stomach cancer Neg Hx    Pancreatic cancer Neg Hx     SOCIAL HX: Former smoker   Current Outpatient Medications:    molnupiravir EUA 200 mg CAPS, Take 4 capsules (800 mg total) by mouth 2 (two) times daily for 5 days., Disp: 40 capsule, Rfl: 0   albuterol (PROVENTIL  HFA;VENTOLIN HFA) 108 (90 Base) MCG/ACT inhaler, Inhale 2 puffs into the lungs every 6 (six) hours as needed for wheezing or shortness of breath., Disp: 1 Inhaler, Rfl: 2   amLODipine (NORVASC) 2.5 MG tablet, TAKE 1 TABLET BY MOUTH EVERY DAY, Disp: 90 tablet, Rfl: 3   Artificial Tear Ointment (DRY EYES OP), Apply to eye., Disp: , Rfl:    atenolol (TENORMIN) 25 MG tablet, Take 1 tablet (25 mg total) by mouth daily., Disp: 90 tablet, Rfl: 3   cetirizine (ZYRTEC) 10 MG tablet, Take 10 mg by mouth daily., Disp: , Rfl:    flecainide (TAMBOCOR) 50 MG tablet, Take 1.5 tablets (75 mg total) by mouth 2 (two) times daily., Disp: 270 tablet, Rfl: 3   fluticasone (FLONASE) 50 MCG/ACT nasal spray, Place 1 spray into both nostrils daily., Disp: 16 g, Rfl: 2   Melatonin 10 MG CAPS, Take 3 capsules by mouth at bedtime as needed., Disp: , Rfl:     omeprazole (PRILOSEC) 20 MG capsule, Take 1 capsule (20 mg total) by mouth 2 (two) times daily before a meal., Disp: 90 capsule, Rfl: 3   topiramate (TOPAMAX) 50 MG tablet, Take 1 tablet (50 mg total) by mouth 2 (two) times daily., Disp: 60 tablet, Rfl: 11  EXAM:  VITALS per patient if applicable:  GENERAL: alert, oriented, appears well and in no acute distress  HEENT: atraumatic, conjunttiva clear, no obvious abnormalities on inspection of external nose and ears  NECK: normal movements of the head and neck  LUNGS: on inspection no signs of respiratory distress, breathing rate appears normal, no obvious gross SOB, gasping or wheezing  CV: no obvious cyanosis  MS: moves all visible extremities without noticeable abnormality  PSYCH/NEURO: pleasant and cooperative, no obvious depression or anxiety, speech and thought processing grossly intact  ASSESSMENT AND PLAN:  Discussed the following assessment and plan:  COVID-19 infection.  Patient on day 3 of symptoms.  He has increased risk factors as above.  In no acute distress currently.  We discussed the following  -Plenty of fluids and rest -Continue isolation -We discussed antiviral medication with molnupiravir 4 capsules by mouth twice daily for 5 days -They have pulse oximeter and we recommend they use that to monitor O2 sats and be in touch if this is dropping below 90.     I discussed the assessment and treatment plan with the patient. The patient was provided an opportunity to ask questions and all were answered. The patient agreed with the plan and demonstrated an understanding of the instructions.   The patient was advised to call back or seek an in-person evaluation if the symptoms worsen or if the condition fails to improve as anticipated.     Carolann Littler, MD

## 2020-10-29 DIAGNOSIS — M25561 Pain in right knee: Secondary | ICD-10-CM | POA: Diagnosis not present

## 2020-11-14 ENCOUNTER — Other Ambulatory Visit: Payer: Self-pay

## 2020-11-14 ENCOUNTER — Emergency Department (HOSPITAL_BASED_OUTPATIENT_CLINIC_OR_DEPARTMENT_OTHER)
Admission: EM | Admit: 2020-11-14 | Discharge: 2020-11-14 | Disposition: A | Discharge status: Home / Self Care | Payer: No Typology Code available for payment source | Attending: Student | Admitting: Student

## 2020-11-14 ENCOUNTER — Emergency Department (HOSPITAL_BASED_OUTPATIENT_CLINIC_OR_DEPARTMENT_OTHER): Payer: No Typology Code available for payment source

## 2020-11-14 ENCOUNTER — Encounter (HOSPITAL_BASED_OUTPATIENT_CLINIC_OR_DEPARTMENT_OTHER): Payer: Self-pay | Admitting: *Deleted

## 2020-11-14 DIAGNOSIS — R109 Unspecified abdominal pain: Secondary | ICD-10-CM | POA: Diagnosis not present

## 2020-11-14 DIAGNOSIS — Z87891 Personal history of nicotine dependence: Secondary | ICD-10-CM | POA: Diagnosis not present

## 2020-11-14 DIAGNOSIS — R110 Nausea: Secondary | ICD-10-CM | POA: Insufficient documentation

## 2020-11-14 DIAGNOSIS — I10 Essential (primary) hypertension: Secondary | ICD-10-CM | POA: Diagnosis not present

## 2020-11-14 DIAGNOSIS — Z79899 Other long term (current) drug therapy: Secondary | ICD-10-CM | POA: Diagnosis not present

## 2020-11-14 DIAGNOSIS — R1031 Right lower quadrant pain: Secondary | ICD-10-CM

## 2020-11-14 LAB — COMPREHENSIVE METABOLIC PANEL
ALT: 42 U/L (ref 0–44)
AST: 19 U/L (ref 15–41)
Albumin: 3.9 g/dL (ref 3.5–5.0)
Alkaline Phosphatase: 79 U/L (ref 38–126)
Anion gap: 7 (ref 5–15)
BUN: 15 mg/dL (ref 6–20)
CO2: 19 mmol/L — ABNORMAL LOW (ref 22–32)
Calcium: 9 mg/dL (ref 8.9–10.3)
Chloride: 112 mmol/L — ABNORMAL HIGH (ref 98–111)
Creatinine, Ser: 1.01 mg/dL (ref 0.61–1.24)
GFR, Estimated: >60 mL/min (ref >60)
Glucose, Bld: 100 mg/dL — ABNORMAL HIGH (ref 70–99)
Potassium: 3.7 mmol/L (ref 3.5–5.1)
Sodium: 138 mmol/L (ref 135–145)
Total Bilirubin: 0.3 mg/dL (ref 0.3–1.2)
Total Protein: 7.5 g/dL (ref 6.5–8.1)

## 2020-11-14 LAB — URINALYSIS, ROUTINE W REFLEX MICROSCOPIC
Bilirubin Urine: NEGATIVE
Glucose, UA: NEGATIVE mg/dL
Hgb urine dipstick: NEGATIVE
Ketones, ur: NEGATIVE mg/dL
Leukocytes,Ua: NEGATIVE
Nitrite: NEGATIVE
Protein, ur: NEGATIVE mg/dL
Specific Gravity, Urine: 1.015 (ref 1.005–1.030)
pH: 7 (ref 5.0–8.0)

## 2020-11-14 LAB — CBC
HCT: 43 % (ref 39.0–52.0)
Hemoglobin: 15.3 g/dL (ref 13.0–17.0)
MCH: 31.4 pg (ref 26.0–34.0)
MCHC: 35.6 g/dL (ref 30.0–36.0)
MCV: 88.3 fL (ref 80.0–100.0)
Platelets: 224 10*3/uL (ref 150–400)
RBC: 4.87 MIL/uL (ref 4.22–5.81)
RDW: 14.3 % (ref 11.5–15.5)
WBC: 6.6 10*3/uL (ref 4.0–10.5)
nRBC: 0 % (ref 0.0–0.2)

## 2020-11-14 LAB — LIPASE, BLOOD: Lipase: 40 U/L (ref 11–51)

## 2020-11-14 MED ORDER — DICYCLOMINE HCL 20 MG PO TABS: 20.0000 mg | ORAL_TABLET | Freq: Two times a day (BID) | ORAL | 0 refills | Status: DC

## 2020-11-14 MED ORDER — MORPHINE SULFATE (PF) 4 MG/ML IV SOLN
4.0000 mg | Freq: Once | INTRAVENOUS | Status: AC
  Administered 2020-11-14: 4 mg via INTRAVENOUS
  Filled 2020-11-14: qty 1

## 2020-11-14 MED ORDER — IOHEXOL 350 MG/ML SOLN
85.0000 mL | Freq: Once | INTRAVENOUS | Status: AC | PRN
  Administered 2020-11-14: 85 mL via INTRAVENOUS

## 2020-11-14 MED ORDER — POLYETHYLENE GLYCOL 3350 17 G PO PACK: 17.0000 g | PACK | Freq: Every day | ORAL | 0 refills | Status: DC

## 2020-11-14 MED ORDER — ONDANSETRON HCL 4 MG/2ML IJ SOLN
4.0000 mg | Freq: Once | INTRAMUSCULAR | Status: AC
  Administered 2020-11-14: 4 mg via INTRAVENOUS
  Filled 2020-11-14: qty 2

## 2020-11-14 NOTE — ED Triage Notes (Signed)
C/o right lower abd pain x 1 month " off and on"  constant pain today x 5 hrs

## 2020-11-14 NOTE — Discharge Instructions (Addendum)
You were seen in the emergency department for evaluation of abdominal pain.  Your lab work and CAT scan were reassuringly normal and there is no evidence of appendicitis on your CAT scan today.  There is evidence of a large stool burden in the area where your pain is hurting the most and we will send a prescription for a stool softener called MiraLAX to your pharmacy as well as an antispasmodic known as Bentyl.  Please follow-up with your primary care physician for discussion on follow-up of your abdominal pain.  Please return the emergency department if you experience new or worsening abdominal pain, fever, vomiting, bloody diarrhea or any other concerning symptoms.

## 2020-11-14 NOTE — ED Notes (Signed)
Pt transported to CT ?

## 2020-11-14 NOTE — ED Provider Notes (Signed)
Scott Walter   CSN: [id number] Arrival date & time: 11/14/20  1659     History Chief Complaint  Patient presents with   Abdominal Pain    Scott Walter is a 50 y.o. male with PMH bipolar disorder, HTN, paroxysmal A. fib, previous CVA who presents the emergency department for evaluation of abdominal pain.  Patient states he has had intermittent right lower quadrant pain over the last 2 months but over the last 5 hours he has had an acute exacerbation and persistence of this right lower quadrant pain.  No radiation, 7 out of 10 right now.  Denies vomiting, chest pain, shortness of breath, fevers, chills or other systemic symptoms.  Does endorse nausea.   Abdominal Pain Associated symptoms: nausea   Associated symptoms: no chest pain, no chills, no cough, no dysuria, no fever, no hematuria, no shortness of breath, no sore throat and no vomiting       Past Medical History:  Diagnosis Date   Allergy    Anxiety    Bipolar disorder ([place])    h/o SI   Chronic insomnia    Chronic lower back pain    Depression    Dyslipidemia    Essential hypertension    GERD (gastroesophageal reflux disease)    Headache(784.0)    "monthly" (01/26/2013)   History of stomach ulcers ~ 2009   Midsternal chest pain    a. 06/2014 Myoview: EF 50%, no ischemia/infarct.   Migraine    "maybe 2 in my lifetime" (01/26/2013)   PAF (paroxysmal atrial fibrillation) ([place])    a. Dx 05/2012;  b. 06/2014 Xarelto started in setting of ? TIA (CHA2DS2VASc = 3);  c. 06/2014 Echo: EF 60-65%, no rwma.   PTSD (post-traumatic stress disorder)    Sleep apnea    Stroke [facility])     Patient Active Problem List   Diagnosis Date Noted   Restrictive lung disease 04/18/2020   Pain in right knee 08/31/2019   Sprain of right ankle 10/20/2018   Chondromalacia of right patella 03/15/2018   OSA (obstructive sleep apnea) 06/10/2017   Anterior to posterior tear of superior glenoid labrum of  left shoulder 05/23/2017   Dizziness 05/05/2017   Sensorineural hearing loss (SNHL), bilateral 05/05/2017   Preventative health care 09/06/2014   Testicular discomfort 09/06/2014   Low back pain 09/06/2014   Dyslipidemia    Chest pain 07/18/2014   Visual disturbance 07/18/2014   Paroxysmal atrial fibrillation ([place]) 07/18/2014   Chest pain with moderate risk for cardiac etiology 07/18/2014   Amaurosis fugax of right eye 07/18/2014   TIA (transient ischemic attack)    Shortness of breath 03/02/2013   Chronic insomnia 03/02/2013   Bipolar disorder ([place]) 03/02/2013   Precordial pain 01/26/2013   Hyperglycemia 01/26/2013   Essential hypertension 06/12/2012   ETOH abuse 06/12/2012    Past Surgical History:  Procedure Laterality Date   EAR CANALOPLASTY     KNEE ARTHROSCOPY Right 09/2011   KNEE SURGERY  02/2017   right knee   SHOULDER SURGERY     VASECTOMY  09/2017       Family History  Problem Relation Age of Onset   CAD Mother    Diabetes Mother    Breast cancer Mother    Emphysema Father    Healthy Sister    Healthy Brother    Healthy Sister    Healthy Sister    Healthy Sister    Healthy Sister    Healthy  Sister    Healthy Sister    Healthy Sister    Healthy Brother    Healthy Brother    Healthy Brother    HIV Brother    Colon cancer Maternal Grandfather    Esophageal cancer Neg Hx    Stomach cancer Neg Hx    Pancreatic cancer Neg Hx     Social History   Tobacco Use   Smoking status: Former    Packs/day: 0.75    Years: 30.00    Pack years: 22.50    Types: Cigarettes    Quit date: 03/07/2017    Years since quitting: 3.6   Smokeless tobacco: Former    Types: Chew   Tobacco comments:    01/26/2013 "quit chewing > 20 yr ago"  Vaping Use   Vaping Use: Former  Substance Use Topics   Alcohol use: Never    Comment: occ   Drug use: No    Home Medications Prior to Admission medications   Medication Sig Start Date End Date Taking? Authorizing Provider   albuterol (PROVENTIL HFA;VENTOLIN HFA) 108 (90 Base) MCG/ACT inhaler Inhale 2 puffs into the lungs every 6 (six) hours as needed for wheezing or shortness of breath. 07/21/16  Yes Fay Records, MD  amLODipine (NORVASC) 2.5 MG tablet TAKE 1 TABLET BY MOUTH EVERY DAY 01/31/20  Yes Fay Records, MD  Artificial Tear Ointment (DRY EYES OP) Apply to eye.   Yes [provider]  atenolol (TENORMIN) 25 MG tablet Take 1 tablet (25 mg total) by mouth daily. 03/23/17  Yes Fay Records, MD  cetirizine (ZYRTEC) 10 MG tablet Take 10 mg by mouth daily.   Yes [provider]  dicyclomine (BENTYL) 20 MG tablet Take 1 tablet (20 mg total) by mouth 2 (two) times daily. 11/14/20  Yes Jennalee Greaves, MD  flecainide (TAMBOCOR) 50 MG tablet Take 1.5 tablets (75 mg total) by mouth 2 (two) times daily. 08/21/20  Yes Fay Records, MD  fluticasone [facility]) 50 MCG/ACT nasal spray Place 1 spray into both nostrils daily. 04/29/17  Yes Petrucelli, Samantha R, PA-C  Melatonin 10 MG CAPS Take 3 capsules by mouth at bedtime as needed.   Yes [provider]  omeprazole (PRILOSEC) 20 MG capsule Take 1 capsule (20 mg total) by mouth 2 (two) times daily before a meal. 06/04/17  Yes Fay Records, MD  polyethylene glycol (MIRALAX) 17 g packet Take 17 g by mouth daily. 11/14/20  Yes Dwayna Kentner, MD  topiramate (TOPAMAX) 50 MG tablet Take 1 tablet (50 mg total) by mouth 2 (two) times daily. 03/16/20  Yes Burchette, Alinda Sierras, MD    Allergies    Ambien [zolpidem tartrate], Divalproex sodium, No known allergies, Valproic acid, Zolpidem, and Aspirin  Review of Systems   Review of Systems  Constitutional:  Negative for chills and fever.  HENT:  Negative for ear pain and sore throat.   Eyes:  Negative for pain and visual disturbance.  Respiratory:  Negative for cough and shortness of breath.   Cardiovascular:  Negative for chest pain and palpitations.  Gastrointestinal:  Positive for abdominal pain and nausea.  Negative for vomiting.  Genitourinary:  Negative for dysuria and hematuria.  Musculoskeletal:  Negative for arthralgias and back pain.  Skin:  Negative for color change and rash.  Neurological:  Negative for seizures and syncope.  All other systems reviewed and are negative.  Physical Exam Updated Vital Signs BP 131/85 (BP Location: Right Arm)   Pulse  60   Temp 98.1 F (36.7 C) (Oral)   Resp 17   Ht 5\' 9"  (1.753 m)   Wt 108.9 kg   SpO2 100%   BMI 35.44 kg/m   Physical Exam Vitals and nursing Walter reviewed.  Constitutional:      Appearance: He is well-developed.  HENT:     Head: Normocephalic and atraumatic.  Eyes:     Conjunctiva/sclera: Conjunctivae normal.  Cardiovascular:     Rate and Rhythm: Normal rate and regular rhythm.     Heart sounds: No murmur heard. Pulmonary:     Effort: Pulmonary effort is normal. No respiratory distress.     Breath sounds: Normal breath sounds.  Abdominal:     Palpations: Abdomen is soft.     Tenderness: There is abdominal tenderness in the right lower quadrant.  Musculoskeletal:     Cervical back: Neck supple.  Skin:    General: Skin is warm and dry.  Neurological:     Mental Status: He is alert.    ED Results / Procedures / Treatments   Labs (all labs ordered are listed, but only abnormal results are displayed) Labs Reviewed  COMPREHENSIVE METABOLIC PANEL - Abnormal; Notable for the following components:      Result Value   Chloride 112 (*)    CO2 19 (*)    Glucose, Bld 100 (*)    All other components within normal limits  LIPASE, BLOOD  CBC  URINALYSIS, ROUTINE W REFLEX MICROSCOPIC    EKG None  Radiology CT ABDOMEN PELVIS W CONTRAST  Result Date: 11/14/2020 CLINICAL DATA:  Right lower quadrant abdominal pain. Worsening since last night. EXAM: CT ABDOMEN AND PELVIS WITH CONTRAST TECHNIQUE: Multidetector CT imaging of the abdomen and pelvis was performed using the standard protocol following bolus administration of  intravenous contrast. CONTRAST:  55mL OMNIPAQUE IOHEXOL 350 MG/ML SOLN COMPARISON:  08/28/2019 FINDINGS: Lower chest: No acute abnormality. Hepatobiliary: No focal liver abnormality is seen. No gallstones, gallbladder wall thickening, or biliary dilatation. Pancreas: Unremarkable. No pancreatic ductal dilatation or surrounding inflammatory changes. Spleen: Normal in size without focal abnormality. Adrenals/Urinary Tract: Adrenal glands are unremarkable. Kidneys are normal, without renal calculi, focal lesion, or hydronephrosis. Bladder is unremarkable. Stomach/Bowel: Stomach is within normal limits. No evidence of bowel wall thickening, distention, or inflammatory changes. Normal appendix. Diverticulosis without evidence of diverticulitis. Vascular/Lymphatic: No significant vascular findings are present. No enlarged abdominal or pelvic lymph nodes. Reproductive: Prostate is unremarkable. Other: No abdominal wall hernia or abnormality. No abdominopelvic ascites. Musculoskeletal: No acute or significant osseous findings. IMPRESSION: 1. No acute abdominal or pelvic pathology. Electronically Signed   By: Kathreen Devoid M.D.   On: 11/14/2020 19:42    Procedures Procedures   Medications Ordered in ED Medications  morphine 4 MG/ML injection 4 mg (4 mg Intravenous Given 11/14/20 1759)  ondansetron (ZOFRAN) injection 4 mg (4 mg Intravenous Given 11/14/20 1759)  iohexol (OMNIPAQUE) 350 MG/ML injection 85 mL (85 mLs Intravenous Contrast Given 11/14/20 1906)    ED Course  I have reviewed the triage vital signs and the nursing notes.  Pertinent labs & imaging results that were available during my care of the patient were reviewed by me and considered in my medical decision making (see chart for details).    MDM Rules/Calculators/A&P                           Patient seen the emergency department for evaluation of right lower  quadrant pain.  Physical exam reveals tenderness over the right lower quadrant with no  rebound or guarding.  Laboratory evaluation largely unremarkable outside of a CO2 of 19, UA unremarkable.  CT abdomen pelvis with no evidence of appendicitis but there does appear to be a fairly large stool burden at the level of the cecum.  Patient's pain may be secondary to constipation and the patient we discharged with a prescription for MiraLAX.  He was also discharged with prescription for Bentyl and will follow up with his primary care physician.  He was given strict return precautions which she voiced understanding and patient then discharged. Final Clinical Impression(s) / ED Diagnoses Final diagnoses:  Right lower quadrant abdominal pain    Rx / DC Orders ED Discharge Orders          Ordered    polyethylene glycol (MIRALAX) 17 g packet  Daily        11/14/20 2011    dicyclomine (BENTYL) 20 MG tablet  2 times daily        11/14/20 2011             Teressa Lower, MD 11/14/20 2324

## 2020-12-10 ENCOUNTER — Encounter: Payer: Self-pay | Admitting: Family Medicine

## 2020-12-26 DIAGNOSIS — M2241 Chondromalacia patellae, right knee: Secondary | ICD-10-CM | POA: Diagnosis not present

## 2021-01-22 ENCOUNTER — Other Ambulatory Visit (HOSPITAL_COMMUNITY): Payer: Federal, State, Local not specified - PPO

## 2021-02-12 DIAGNOSIS — M1711 Unilateral primary osteoarthritis, right knee: Secondary | ICD-10-CM | POA: Diagnosis not present

## 2021-04-16 DIAGNOSIS — M1711 Unilateral primary osteoarthritis, right knee: Secondary | ICD-10-CM | POA: Diagnosis not present

## 2021-04-23 DIAGNOSIS — M1711 Unilateral primary osteoarthritis, right knee: Secondary | ICD-10-CM | POA: Diagnosis not present

## 2021-04-30 DIAGNOSIS — M1711 Unilateral primary osteoarthritis, right knee: Secondary | ICD-10-CM | POA: Diagnosis not present

## 2021-05-28 ENCOUNTER — Other Ambulatory Visit: Payer: Self-pay | Admitting: Family Medicine

## 2021-06-27 ENCOUNTER — Telehealth (INDEPENDENT_AMBULATORY_CARE_PROVIDER_SITE_OTHER): Payer: Federal, State, Local not specified - PPO | Admitting: Family Medicine

## 2021-06-27 DIAGNOSIS — R600 Localized edema: Secondary | ICD-10-CM

## 2021-06-27 DIAGNOSIS — J069 Acute upper respiratory infection, unspecified: Secondary | ICD-10-CM | POA: Diagnosis not present

## 2021-06-27 DIAGNOSIS — R0981 Nasal congestion: Secondary | ICD-10-CM | POA: Diagnosis not present

## 2021-06-27 DIAGNOSIS — R059 Cough, unspecified: Secondary | ICD-10-CM | POA: Diagnosis not present

## 2021-06-27 DIAGNOSIS — H1031 Unspecified acute conjunctivitis, right eye: Secondary | ICD-10-CM | POA: Diagnosis not present

## 2021-06-27 DIAGNOSIS — H5711 Ocular pain, right eye: Secondary | ICD-10-CM | POA: Diagnosis not present

## 2021-06-27 DIAGNOSIS — Z03818 Encounter for observation for suspected exposure to other biological agents ruled out: Secondary | ICD-10-CM | POA: Diagnosis not present

## 2021-06-27 DIAGNOSIS — H5789 Other specified disorders of eye and adnexa: Secondary | ICD-10-CM

## 2021-06-27 DIAGNOSIS — R5383 Other fatigue: Secondary | ICD-10-CM | POA: Diagnosis not present

## 2021-06-27 NOTE — Progress Notes (Signed)
Virtual Visit via Video Note ? ?I connected with Eddie ? on 06/27/21 at  3:00 PM EDT by a video enabled telemedicine application and verified that I am speaking with the correct person using two identifiers. ? Location patient: [place] ?Location provider:work or home office ?Persons participating in the virtual visit: patient, provider ? ?I discussed the limitations and requested verbal permission for telemedicine visit. The patient expressed understanding and agreed to proceed. ? ? ?HPI: ? ?Acute telemedicine visit for eye issues ?-Onset:R eye ?-Symptoms include:muck in the R eye with some crusting of the eyelid yesterday, however today now has fully blurred vision, discomfort in the eye and reports swelling of the eyelid, periocular region and the face ?-Denies:fever, malaise, HA, NV, known sick exposures or pink eye exposure, known foreign body or trauma or high risk activities for trauma to the eye ?-also admits to sneezing, cough and nasal congestion ?-Pertinent medication allergies: ?Allergies  ?Allergen Reactions  ? Ambien [Zolpidem Tartrate] Other (See Comments)  ?  "Nightmares and mood swings  ? Divalproex Sodium Anaphylaxis, Shortness Of Breath and Swelling  ? No Known Allergies Anaphylaxis  ? Valproic Acid Anaphylaxis and Swelling  ? Zolpidem Anaphylaxis  ? Aspirin Hives  ?-COVID-19 vaccine status:  ?Immunization History  ?Administered Date(s) Administered  ? Influenza,inj,Quad PF,6+ Mos 01/27/2013  ? Influenza-Unspecified 02/18/2008  ? Pneumococcal Polysaccharide-23 06/12/2012  ? Rabies, IM 07/23/2016  ? Tdap 09/06/2014  ? ? ? ?ROS: See pertinent positives and negatives per HPI. ? ?Past Medical History:  ?Diagnosis Date  ? Allergy   ? Anxiety   ? Bipolar disorder ([place])   ? h/o SI  ? Chronic insomnia   ? Chronic lower back pain   ? Depression   ? Dyslipidemia   ? Essential hypertension   ? GERD (gastroesophageal reflux disease)   ? Headache(784.0)   ? "monthly" (01/26/2013)  ? History of stomach ulcers ~ 2009   ? Midsternal chest pain   ? a. 06/2014 Myoview: EF 50%, no ischemia/infarct.  ? Migraine   ? "maybe 2 in my lifetime" (01/26/2013)  ? PAF (paroxysmal atrial fibrillation) ([place])   ? a. Dx 05/2012;  b. 06/2014 Xarelto started in setting of ? TIA (CHA2DS2VASc = 3);  c. 06/2014 Echo: EF 60-65%, no rwma.  ? PTSD (post-traumatic stress disorder)   ? Sleep apnea   ? Stroke [facility])   ? ? ?Past Surgical History:  ?Procedure Laterality Date  ? EAR CANALOPLASTY    ? KNEE ARTHROSCOPY Right 09/2011  ? KNEE SURGERY  02/2017  ? right knee  ? SHOULDER SURGERY    ? VASECTOMY  09/2017  ? ? ? ?Current Outpatient Medications:  ?  albuterol (PROVENTIL HFA;VENTOLIN HFA) 108 (90 Base) MCG/ACT inhaler, Inhale 2 puffs into the lungs every 6 (six) hours as needed for wheezing or shortness of breath., Disp: 1 Inhaler, Rfl: 2 ?  amLODipine (NORVASC) 2.5 MG tablet, TAKE 1 TABLET BY MOUTH EVERY DAY, Disp: 90 tablet, Rfl: 3 ?  Artificial Tear Ointment (DRY EYES OP), Apply to eye., Disp: , Rfl:  ?  atenolol (TENORMIN) 25 MG tablet, Take 1 tablet (25 mg total) by mouth daily., Disp: 90 tablet, Rfl: 3 ?  cetirizine (ZYRTEC) 10 MG tablet, Take 10 mg by mouth daily., Disp: , Rfl:  ?  dicyclomine (BENTYL) 20 MG tablet, Take 1 tablet (20 mg total) by mouth 2 (two) times daily., Disp: 20 tablet, Rfl: 0 ?  flecainide (TAMBOCOR) 50 MG tablet, Take 1.5 tablets (75 mg total)  by mouth 2 (two) times daily., Disp: 270 tablet, Rfl: 3 ?  fluticasone (FLONASE) 50 MCG/ACT nasal spray, Place 1 spray into both nostrils daily., Disp: 16 g, Rfl: 2 ?  Melatonin 10 MG CAPS, Take 3 capsules by mouth at bedtime as needed., Disp: , Rfl:  ?  omeprazole (PRILOSEC) 20 MG capsule, Take 1 capsule (20 mg total) by mouth 2 (two) times daily before a meal., Disp: 90 capsule, Rfl: 3 ?  polyethylene glycol (MIRALAX) 17 g packet, Take 17 g by mouth daily., Disp: 14 each, Rfl: 0 ?  topiramate (TOPAMAX) 50 MG tablet, TAKE 1 TABLET BY MOUTH TWICE A DAY, Disp: 180 tablet, Rfl:  0 ? ?EXAM: ? ?VITALS per patient if applicable: ? ?GENERAL: alert, oriented, appears well and in no acute distress ? ?HEENT: atraumatic, conjunttiva clear, no obvious abnormalities on inspection of external nose and ears, right eyelid and periocular region with edema, r facial edema, EOMI, PER, drainage from the R eye - exam limited given video visit ? ?NECK: normal movements of the head and neck ? ?LUNGS: on inspection no signs of respiratory distress, breathing rate appears normal, no obvious gross SOB, gasping or wheezing ? ?CV: no obvious cyanosis ? ?MS: moves all visible extremities without noticeable abnormality ? ?PSYCH/NEURO: pleasant and cooperative, no obvious depression or anxiety, speech and thought processing grossly intact ? ?ASSESSMENT AND PLAN: ? ?Discussed the following assessment and plan: ? ?Discomfort of right eye ? ?Eye drainage ? ?Facial edema ? ?-we discussed possible serious and likely etiologies, options for evaluation and workup, limitations of telemedicine visit vs in person visit, treatment, treatment risks and precautions. Pt is agreeable to treatment via telemedicine at this moment. Given the degree of symptoms and concerns advised inperson evaluation for good eye exam today at a higher level of care. He agrees and reports he has someone who will drive him to a nearby [facility]. He did not covid test and does not have covid tests at home - other symptoms could be allergy related but advised requesting covid testing given new covid variant can present with conjunctivitis.  ?  ?I discussed the assessment and treatment plan with the patient. The patient was provided an opportunity to ask questions and all were answered. The patient agreed with the plan and demonstrated an understanding of the instructions. ?  ? ? ?Lucretia Kern, DO  ? ?

## 2021-06-27 NOTE — Patient Instructions (Signed)
Please go to the Urgent Care today for an inperson exam. Please go as soon as possible today. ? ? ? ?I hope you are feeling better soon! ? ? ?It was nice to meet you today. I help [place] out with telemedicine visits on Tuesdays and Thursdays and am happy to help if you need a virtual follow up visit on those days. Otherwise, if you have any concerns or questions following this visit please schedule a follow up visit with your Primary Care office or seek care at a local urgent care clinic to avoid delays in care. If you are having severe or life threatening symptoms please call 911 and/or go to the nearest emergency room.  ? ?

## 2021-07-09 DIAGNOSIS — M1711 Unilateral primary osteoarthritis, right knee: Secondary | ICD-10-CM | POA: Diagnosis not present

## 2021-07-09 DIAGNOSIS — M25561 Pain in right knee: Secondary | ICD-10-CM | POA: Diagnosis not present

## 2021-07-20 DIAGNOSIS — M25561 Pain in right knee: Secondary | ICD-10-CM | POA: Diagnosis not present

## 2021-07-30 DIAGNOSIS — M1711 Unilateral primary osteoarthritis, right knee: Secondary | ICD-10-CM | POA: Diagnosis not present

## 2021-07-30 DIAGNOSIS — M2241 Chondromalacia patellae, right knee: Secondary | ICD-10-CM | POA: Diagnosis not present

## 2021-08-13 ENCOUNTER — Encounter: Payer: Self-pay | Admitting: Internal Medicine

## 2021-08-13 ENCOUNTER — Ambulatory Visit: Payer: Federal, State, Local not specified - PPO | Admitting: Internal Medicine

## 2021-08-13 ENCOUNTER — Telehealth: Payer: Self-pay | Admitting: *Deleted

## 2021-08-13 VITALS — BP 136/100 | HR 62 | Ht 70.0 in | Wt 263.0 lb

## 2021-08-13 DIAGNOSIS — E785 Hyperlipidemia, unspecified: Secondary | ICD-10-CM | POA: Diagnosis not present

## 2021-08-13 DIAGNOSIS — R739 Hyperglycemia, unspecified: Secondary | ICD-10-CM

## 2021-08-13 DIAGNOSIS — I480 Paroxysmal atrial fibrillation: Secondary | ICD-10-CM

## 2021-08-13 DIAGNOSIS — Z79899 Other long term (current) drug therapy: Secondary | ICD-10-CM

## 2021-08-13 DIAGNOSIS — I10 Essential (primary) hypertension: Secondary | ICD-10-CM

## 2021-08-13 DIAGNOSIS — G4733 Obstructive sleep apnea (adult) (pediatric): Secondary | ICD-10-CM

## 2021-08-14 LAB — BASIC METABOLIC PANEL
BUN/Creatinine Ratio: 14 (ref 9–20)
BUN: 13 mg/dL (ref 6–24)
CO2: 26 mmol/L (ref 20–29)
Calcium: 9.8 mg/dL (ref 8.7–10.2)
Chloride: 103 mmol/L (ref 96–106)
Creatinine, Ser: 0.96 mg/dL (ref 0.76–1.27)
Glucose: 100 mg/dL — ABNORMAL HIGH (ref 70–99)
Potassium: 4.9 mmol/L (ref 3.5–5.2)
Sodium: 142 mmol/L (ref 134–144)
eGFR: 96 mL/min/{1.73_m2} (ref >59)

## 2021-08-14 LAB — CBC
Hematocrit: 45.8 % (ref 37.5–51.0)
Hemoglobin: 15.7 g/dL (ref 13.0–17.7)
MCH: 30.5 pg (ref 26.6–33.0)
MCHC: 34.3 g/dL (ref 31.5–35.7)
MCV: 89 fL (ref 79–97)
Platelets: 177 10*3/uL (ref 150–450)
RBC: 5.14 x10E6/uL (ref 4.14–5.80)
RDW: 13.4 % (ref 11.6–15.4)
WBC: 6.7 10*3/uL (ref 3.4–10.8)

## 2021-08-14 LAB — LIPID PANEL
Chol/HDL Ratio: 3.8 ratio (ref 0.0–5.0)
Cholesterol, Total: 206 mg/dL — ABNORMAL HIGH (ref 100–199)
HDL: 54 mg/dL (ref >39)
LDL Chol Calc (NIH): 133 mg/dL — ABNORMAL HIGH (ref 0–99)
Triglycerides: 105 mg/dL (ref 0–149)
VLDL Cholesterol Cal: 19 mg/dL (ref 5–40)

## 2021-08-14 LAB — TSH: TSH: 1.24 u[IU]/mL (ref 0.450–4.500)

## 2021-08-14 LAB — HEMOGLOBIN A1C
Est. average glucose Bld gHb Est-mCnc: 111 mg/dL
Hgb A1c MFr Bld: 5.5 % (ref 4.8–5.6)

## 2021-08-19 NOTE — Telephone Encounter (Signed)
7/3  NO PA REQ, CALL REF BRANDON W. 7/3

## 2021-08-19 NOTE — Telephone Encounter (Signed)
I s/w the pt and he has been given the PIN# [id number].   Called and made the patient aware that he may proceed with the [facility] Sleep Study. PIN # provided to the patient. Patient made aware that he will be contacted after the test has been read with the results and any recommendations. Patient verbalized understanding and thanked me for the call.    Pt said he will do sleep study this week

## 2021-08-20 ENCOUNTER — Encounter (INDEPENDENT_AMBULATORY_CARE_PROVIDER_SITE_OTHER): Payer: Federal, State, Local not specified - PPO | Admitting: Cardiology

## 2021-08-20 DIAGNOSIS — G4733 Obstructive sleep apnea (adult) (pediatric): Secondary | ICD-10-CM

## 2021-08-23 ENCOUNTER — Ambulatory Visit: Payer: Federal, State, Local not specified - PPO

## 2021-08-23 DIAGNOSIS — G4733 Obstructive sleep apnea (adult) (pediatric): Secondary | ICD-10-CM

## 2021-08-23 NOTE — Procedures (Signed)
   SLEEP STUDY REPORT Patient Information Study Date: 08/20/21 Patient Name: Scott Walter Patient ID: [id number] Birth Date: 23-Jun-2070 Age: 51 Gender: Male BMI: 37.6 (W=262 lb, H=5' 10'') Neck Circ.: 17 '' Referring Physician: Dorris Carnes, MD  TEST DESCRIPTION: Home sleep apnea testing was completed using the WatchPat, a Type 1 device, utilizing peripheral arterial tonometry (PAT), chest movement, actigraphy, pulse oximetry, pulse rate, body position and snore. AHI was calculated with apnea and hypopnea using valid sleep time as the denominator. RDI includes apneas, hypopneas, and RERAs. The data acquired and the scoring of sleep and all associated events were performed in accordance with the recommended standards and specifications as outlined in the AASM Manual for the Scoring of Sleep and Associated Events 2.2.0 (2015).  FINDINGS: 1. Moderate Obstructive Sleep Apnea with AHI 25/hr. 2. No Central Sleep Apnea with pAHIc 0.9/hr. 3. Oxygen desaturations as low as 81%. 4. Severe snoring was present. O2 sats were < 88% for 12.4 min. 5. Total sleep time was 7 hrs and 36 min. 6. 20.9% of total sleep time was spent in REM sleep. 7. Prolonged sleep onset latency at 26 min 8. Shortened REM sleep onset latency at 62 min. 9. Total awakenings were 10.  DIAGNOSIS: Moderate Obstructive Sleep Apnea (G47.33)  RECOMMENDATIONS: 1. Clinical correlation of these findings is necessary. The decision to treat obstructive sleep apnea (OSA) is usually based on the presence of apnea symptoms or the presence of associated medical conditions such as Hypertension, Congestive Heart Failure, Atrial Fibrillation or Obesity. The most common symptoms of OSA are snoring, gasping for breath while sleeping, daytime sleepiness and fatigue.  2. Initiating apnea therapy is recommended given the presence of symptoms and/or associated conditions. Recommend proceeding with one of the following:   a. Auto-CPAP therapy  with a pressure range of 5-20cm H2O.   b. An oral appliance (OA) that can be obtained from certain dentists with expertise in sleep medicine. These are primarily of use in non-obese patients with mild and moderate disease.   c. An ENT consultation which may be useful to look for specific causes of obstruction and possible treatment options.   d. If patient is intolerant to PAP therapy, consider referral to ENT for evaluation for hypoglossal nerve stimulator.  3. Close follow-up is necessary to ensure success with CPAP or oral appliance therapy for maximum benefit .  4. A follow-up oximetry study on CPAP is recommended to assess the adequacy of therapy and determine the need for supplemental oxygen or the potential need for Bi-level therapy. An arterial blood gas to determine the adequacy of baseline ventilation and oxygenation should also be considered.  5. Healthy sleep recommendations include: adequate nightly sleep (normal 7-9 hrs/night), avoidance of caffeine after noon and alcohol near bedtime, and maintaining a sleep environment that is cool, dark and quiet.  6. Weight loss for overweight patients is recommended. Even modest amounts of weight loss can significantly improve the severity of sleep apnea.  7. Snoring recommendations include: weight loss where appropriate, side sleeping, and avoidance of alcohol before bed.  8. Operation of motor vehicle should not be performed when sleepy.  Signature: Electronically Signed: 08/23/21 Fransico Him, MD; [facility]; [place], American Board of Sleep Medicine

## 2021-08-27 ENCOUNTER — Other Ambulatory Visit: Payer: Self-pay | Admitting: Internal Medicine

## 2021-09-02 ENCOUNTER — Encounter: Payer: Federal, State, Local not specified - PPO | Admitting: Family Medicine

## 2021-09-10 ENCOUNTER — Encounter: Payer: Self-pay | Admitting: Family Medicine

## 2021-09-10 ENCOUNTER — Ambulatory Visit (INDEPENDENT_AMBULATORY_CARE_PROVIDER_SITE_OTHER): Payer: Federal, State, Local not specified - PPO | Admitting: Family Medicine

## 2021-09-10 VITALS — BP 128/84 | HR 67 | Temp 97.9°F | Ht 68.9 in | Wt 267.6 lb

## 2021-09-10 DIAGNOSIS — Z0000 Encounter for general adult medical examination without abnormal findings: Secondary | ICD-10-CM | POA: Diagnosis not present

## 2021-09-10 LAB — PSA: PSA: 0.68 ng/mL (ref 0.10–4.00)

## 2021-09-10 NOTE — Progress Notes (Signed)
Established Patient Office Visit  Subjective   Patient ID: Scott Walter, male    DOB: 1970/08/26  Age: 51 y.o. MRN: [id number]  Chief Complaint  Patient presents with   Annual Exam    HPI   [name] is seen for physical exam.  Past medical history reviewed.  He has history of atrial fibrillation, hypertension, history of TIA, obstructive sleep apnea, dyslipidemia, bipolar disorder.  Followed by cardiology.  He is maintained on flecainide and also takes low-dose atenolol.  Ongoing issues with his right knee.  Has been scheduled for patella replacement surgery.  He hopes to get more active and back in the gym after that.  He has some chronic depression but states his symptoms are no change from years previously.  No suicidal ideation.  Health maintenance reviewed  -No history of hepatitis C screening.  Does have multiple tattoos -No recent PSA  -Colonoscopy due 2031  Family history reviewed.  His mom had history of diabetes and what sounds like CAD.  She died age 8 unknown cause.  Possibly related to CAD.  His father has emphysema and longstanding history of nicotine use.  He has 8 sisters and 5 brothers.  No active health problems.  Social history.  He is married has 4 children and 2 grandchildren.  They do live locally.  His grandchildren are ages 64 and 2.  He has been out of work for a few years.  He is applied for disability.  Quit smoking 2019.  Usually drinks about 4-6 beers per week.  No binge drinking.  Past Medical History:  Diagnosis Date   Allergy    Anxiety    Bipolar disorder ([place])    h/o SI   Chronic insomnia    Chronic lower back pain    Depression    Dyslipidemia    Essential hypertension    GERD (gastroesophageal reflux disease)    Headache(784.0)    "monthly" (01/26/2013)   History of stomach ulcers ~ 2009   Midsternal chest pain    a. 06/2014 Myoview: EF 50%, no ischemia/infarct.   Migraine    "maybe 2 in my lifetime" (01/26/2013)   PAF (paroxysmal  atrial fibrillation) ([place])    a. Dx 05/2012;  b. 06/2014 Xarelto started in setting of ? TIA (CHA2DS2VASc = 3);  c. 06/2014 Echo: EF 60-65%, no rwma.   PTSD (post-traumatic stress disorder)    Sleep apnea    Stroke [facility])    Past Surgical History:  Procedure Laterality Date   EAR CANALOPLASTY     KNEE ARTHROSCOPY Right 09/2011   KNEE SURGERY  02/2017   right knee   SHOULDER SURGERY     VASECTOMY  09/2017    reports that he quit smoking about 4 years ago. His smoking use included cigarettes. He has a 22.50 pack-year smoking history. He has quit using smokeless tobacco.  His smokeless tobacco use included chew. He reports that he does not drink alcohol and does not use drugs. family history includes CAD in his mother; Colon cancer in his maternal grandfather; Diabetes in his mother; Emphysema in his father; HIV in his brother; Healthy in his brother, brother, brother, brother, sister, sister, sister, sister, sister, sister, sister, and sister. Allergies  Allergen Reactions   Ambien [Zolpidem Tartrate] Other (See Comments)    "Nightmares and mood swings   Divalproex Sodium Anaphylaxis, Shortness Of Breath and Swelling   No Known Allergies Anaphylaxis   Valproic Acid Anaphylaxis and Swelling   Zolpidem Anaphylaxis  Aspirin Hives    Review of Systems  Constitutional:  Negative for chills, fever, malaise/fatigue and weight loss.  HENT:  Negative for hearing loss.   Eyes:  Negative for blurred vision and double vision.  Respiratory:  Negative for cough and shortness of breath.   Cardiovascular:  Negative for chest pain, palpitations and leg swelling.  Gastrointestinal:  Negative for abdominal pain, blood in stool, constipation and diarrhea.  Genitourinary:  Negative for dysuria.  Musculoskeletal:  Positive for joint pain.  Skin:  Negative for rash.  Neurological:  Negative for dizziness, speech change, seizures, loss of consciousness and headaches.  Psychiatric/Behavioral:         PHQ-9  screen      Objective:     BP 128/84 (BP Location: Left Arm, Patient Position: Sitting, Cuff Size: Normal)   Pulse 67   Temp 97.9 F (36.6 C) (Oral)   Ht 5' 8.9" (1.75 m)   Wt 267 lb 9.6 oz (121.4 kg)   SpO2 96%   BMI 39.63 kg/m  BP Readings from Last 3 Encounters:  09/10/21 128/84  08/13/21 (!) 136/100  11/14/20 131/85   Wt Readings from Last 3 Encounters:  09/10/21 267 lb 9.6 oz (121.4 kg)  08/13/21 263 lb (119.3 kg)  11/14/20 240 lb (108.9 kg)      Physical Exam Constitutional:      General: He is not in acute distress.    Appearance: He is well-developed.  HENT:     Head: Normocephalic and atraumatic.     Right Ear: External ear normal.     Left Ear: External ear normal.  Eyes:     Conjunctiva/sclera: Conjunctivae normal.     Pupils: Pupils are equal, round, and reactive to light.  Neck:     Thyroid: No thyromegaly.  Cardiovascular:     Rate and Rhythm: Normal rate and regular rhythm.     Heart sounds: Normal heart sounds. No murmur heard. Pulmonary:     Effort: No respiratory distress.     Breath sounds: No wheezing or rales.  Abdominal:     General: Bowel sounds are normal. There is no distension.     Palpations: Abdomen is soft. There is no mass.     Tenderness: There is no abdominal tenderness. There is no guarding or rebound.  Musculoskeletal:     Cervical back: Normal range of motion and neck supple.     Comments: Prominent vertical scar right anterior knee from multiple prior surgeries.  Lymphadenopathy:     Cervical: No cervical adenopathy.  Skin:    Findings: No rash.  Neurological:     Mental Status: He is alert and oriented to person, place, and time.     Cranial Nerves: No cranial nerve deficit.      No results found for any visits on 09/10/21.  Last CBC Lab Results  Component Value Date   WBC 6.7 08/13/2021   HGB 15.7 08/13/2021   HCT 45.8 08/13/2021   MCV 89 08/13/2021   MCH 30.5 08/13/2021   RDW 13.4 08/13/2021   PLT 177  29/52/8413   Last metabolic panel Lab Results  Component Value Date   GLUCOSE 100 (H) 08/13/2021   NA 142 08/13/2021   K 4.9 08/13/2021   CL 103 08/13/2021   CO2 26 08/13/2021   BUN 13 08/13/2021   CREATININE 0.96 08/13/2021   EGFR 96 08/13/2021   CALCIUM 9.8 08/13/2021   PROT 7.5 11/14/2020   ALBUMIN 3.9 11/14/2020   BILITOT  0.3 11/14/2020   ALKPHOS 79 11/14/2020   AST 19 11/14/2020   ALT 42 11/14/2020   ANIONGAP 7 11/14/2020   Last lipids Lab Results  Component Value Date   CHOL 206 (H) 08/13/2021   HDL 54 08/13/2021   LDLCALC 133 (H) 08/13/2021   TRIG 105 08/13/2021   CHOLHDL 3.8 08/13/2021   Last hemoglobin A1c Lab Results  Component Value Date   HGBA1C 5.5 08/13/2021   Last thyroid functions Lab Results  Component Value Date   TSH 1.240 08/13/2021      The 10-year ASCVD risk score (Arnett DK, et al., 2019) is: 3.9%    Assessment & Plan:   Problem List Items Addressed This Visit   None Visit Diagnoses     Physical exam    -  Primary   Relevant Orders   PSA   Hep C Antibody     -Health maintenance discussed.  His colonoscopy is up-to-date.  We recommend hepatitis C screening.  We discussed PSA screening.  -Hopefully can establish more consistent exercise patterns after he recovers from his upcoming knee surgery.  -Scale back overall calories especially high glycemic foods and saturated fats.  -Recommend annual flu vaccine  No follow-ups on file.    Carolann Littler, MD

## 2021-09-11 ENCOUNTER — Telehealth: Payer: Self-pay | Admitting: *Deleted

## 2021-09-11 DIAGNOSIS — M1711 Unilateral primary osteoarthritis, right knee: Secondary | ICD-10-CM | POA: Diagnosis not present

## 2021-09-11 DIAGNOSIS — M2241 Chondromalacia patellae, right knee: Secondary | ICD-10-CM | POA: Diagnosis not present

## 2021-09-11 LAB — HEPATITIS C ANTIBODY: Hepatitis C Ab: NONREACTIVE

## 2021-09-11 NOTE — Telephone Encounter (Signed)
   Pre-operative Risk Assessment    Patient Name: Scott Walter  DOB: 01-20-1971 MRN: [id number]      Request for Surgical Clearance    Procedure:   RIGHT PATELLOFEMORAL ARTHROPLASTY  Date of Surgery:  Clearance TBD                                 Surgeon:  DR. MATTHEW OLIN Surgeon's Group or Practice Name:  [facility] Phone number:  [phone] Fax number:  [phone] ATTN: Judeen Hammans WILLS    Type of Clearance Requested:   - Medical ; NO MEDICATIONS ARE LISTED AS NEEDING TO BE HELD    Type of Anesthesia:  Spinal   Additional requests/questions:    Scott Walter   09/11/2021, 3:42 PM

## 2021-09-11 NOTE — Telephone Encounter (Signed)
   Patient Name: Scott Walter  DOB: November 15, 1970 MRN: [id number]  Primary Cardiologist: Dorris Carnes, MD  Chart reviewed as part of pre-operative protocol coverage. Given past medical history and time since last visit, based on ACC/AHA guidelines, Scott Walter would be at acceptable risk for the planned procedure without further cardiovascular testing.   Patient was seen by Dr. [name] in June at which time he was cleared for the upcoming surgery.  The patient was advised that if he develops new symptoms prior to surgery to contact our office to arrange for a follow-up visit, and he verbalized understanding.  I will route this recommendation to the requesting party via Epic fax function and remove from pre-op pool.  Please call with questions.  [place], [place] 09/11/2021, 7:04 PM

## 2021-09-14 ENCOUNTER — Telehealth: Payer: Self-pay | Admitting: *Deleted

## 2021-09-14 DIAGNOSIS — I10 Essential (primary) hypertension: Secondary | ICD-10-CM

## 2021-09-14 DIAGNOSIS — G4733 Obstructive sleep apnea (adult) (pediatric): Secondary | ICD-10-CM

## 2021-09-14 NOTE — Telephone Encounter (Signed)
CC'd Chart Routing History  Routing History  From: Sueanne Margarita, MD On: 08/23/2021 03:45 PM  To: [facility] (Pool)  Priority: Routine  Routing Comments:  Please let patient know that they have sleep apnea.  Recommend therapeutic CPAP titration for treatment of patient's sleep disordered breathing.  If unable to perform an in lab titration then initiate ResMed auto CPAP from 4 to 15cm H2O with heated humidity and mask of choice and overnight pulse ox on CPAP.

## 2021-09-17 NOTE — Telephone Encounter (Signed)
I will forward these notes to requesting office to please see the notes from pre op provider to reach out to prescribing MD for Xarelto clearance.   See previous notes.  [place] [address] [place]- [phone]

## 2021-09-17 NOTE — Telephone Encounter (Signed)
Per chart review Xarelto has not been filled by our office since 2016.  Instructions for holding this medication should come from prescribing provider.  Thank you  Ambrose Pancoast, NP

## 2021-09-17 NOTE — Telephone Encounter (Signed)
Our office received duplicate clearance request today. Clearance was faxed on 09/11/21. In review of the request today, that states need instructions in regard to holding Xarelto. I reviewed the pt's chart and I did not see anywhere back in ov notes all the way back to 2021 that the pt was on Xarelto. I called Judeen Hammans, surgery scheduler to discuss further. In our conversation, she does tell me that the pt absolutely told her yesterday that the cardiologist prescribes Xarelto.   In reviewing the chart while speaking with Judeen Hammans, I did discover in old medications that have been d/c from the pt's med list. I did inform Judeen Hammans that I saw multiple d/c of Xarelto in the pt's old med list from Dr. Renne Crigler and Dr. [name]. The latest deletion was 09/2017 from Dr. [name]. I then also discovered in outside reconciliation of medications that there is an Rx for Xarelto that was filled at the [facility]. Sherry and both agree that we need to confirm if the pt is taking Xarelto, does he need to be on Xarelto and who is following his Xarelto.   I assured Judeen Hammans that I will forward this to preop provider as they may want to inform and d/w Dr.Ross for further advice.   Judeen Hammans does state she does need the Xarelto to be held. Just needs to know who she will need to get those instructions from.

## 2021-09-27 NOTE — Patient Instructions (Signed)
DUE TO COVID-19 ONLY TWO VISITORS  (aged 51 and older)  ARE ALLOWED TO COME WITH YOU AND STAY IN THE WAITING ROOM ONLY DURING PRE OP AND PROCEDURE.   **NO VISITORS ARE ALLOWED IN THE SHORT STAY AREA OR RECOVERY ROOM!!**  IF YOU WILL BE ADMITTED INTO THE HOSPITAL YOU ARE ALLOWED ONLY FOUR SUPPORT PEOPLE DURING VISITATION HOURS ONLY (7 AM -8PM)   The support person(s) must pass our screening, gel in and out, and wear a mask at all times, including in the patient's room. Patients must also wear a mask when staff or their support person are in the room. Visitors GUEST BADGE MUST BE WORN VISIBLY  One adult visitor may remain with you overnight and MUST be in the room by 8 P.M.     Your procedure is scheduled on: 10/17/21   Report to [facility] Main Entrance    Report to admitting at : 9:00 AM   Call this number if you have problems the morning of surgery [phone]   Do not eat food :After Midnight.   After Midnight you may have the following liquids until : 8:30 AM DAY OF SURGERY  Water Black Coffee (sugar ok, NO MILK/CREAM OR CREAMERS)  Tea (sugar ok, NO MILK/CREAM OR CREAMERS) regular and decaf                             Plain Jell-O (NO RED)                                           Fruit ices (not with fruit pulp, NO RED)                                     Popsicles (NO RED)                                                                  Juice: apple, WHITE grape, WHITE cranberry Sports drinks like Gatorade (NO RED)              Drink  Ensure drink AT:  8:30 AM the day of surgery.     The day of surgery:  Drink ONE (1) Pre-Surgery Clear Ensure or G2 at AM the morning of surgery. Drink in one sitting. Do not sip.  This drink was given to you during your hospital  pre-op appointment visit. Nothing else to drink after completing the  Pre-Surgery Clear Ensure or G2.          If you have questions, please contact your surgeon's office.    Oral Hygiene is also  important to reduce your risk of infection.                                    Remember - BRUSH YOUR TEETH THE MORNING OF SURGERY WITH YOUR REGULAR TOOTHPASTE   Do NOT smoke after Midnight   Take these medicines the morning of surgery with  A SIP OF WATER: flecainide,cetirizine,atenolol,omeprazole.Use inhalers and Flonase as usual.  DO NOT TAKE ANY ORAL DIABETIC MEDICATIONS DAY OF YOUR SURGERY  Bring CPAP mask and tubing day of surgery.                              You may not have any metal on your body including hair pins, jewelry, and body piercing             Do not wear lotions, powders, perfumes/cologne, or deodorant              Men may shave face and neck.   Do not bring valuables to the hospital. [place].   Contacts, dentures or bridgework may not be worn into surgery.   Bring small overnight bag day of surgery.   DO NOT [place]. PHARMACY WILL DISPENSE MEDICATIONS LISTED ON YOUR MEDICATION LIST TO YOU DURING YOUR ADMISSION [place]!    Patients discharged on the day of surgery will not be allowed to drive home.  Someone NEEDS to stay with you for the first 24 hours after anesthesia.   Special Instructions: Bring a copy of your healthcare power of attorney and living will documents         the day of surgery if you haven't scanned them before.              Please read over the following fact sheets you were given: IF YOU HAVE QUESTIONS ABOUT YOUR PRE-OP INSTRUCTIONS PLEASE CALL [phone]     [facility] - Preparing for Surgery Before surgery, you can play an important role.  Because skin is not sterile, your skin needs to be as free of germs as possible.  You can reduce the number of germs on your skin by washing with CHG (chlorahexidine gluconate) soap before surgery.  CHG is an antiseptic cleaner which kills germs and bonds with the skin to continue killing germs even after  washing. Please DO NOT use if you have an allergy to CHG or antibacterial soaps.  If your skin becomes reddened/irritated stop using the CHG and inform your nurse when you arrive at Short Stay. Do not shave (including legs and underarms) for at least 48 hours prior to the first CHG shower.  You may shave your face/neck. Please follow these instructions carefully:  1.  Shower with CHG Soap the night before surgery and the  morning of Surgery.  2.  If you choose to wash your hair, wash your hair first as usual with your  normal  shampoo.  3.  After you shampoo, rinse your hair and body thoroughly to remove the  shampoo.                           4.  Use CHG as you would any other liquid soap.  You can apply chg directly  to the skin and wash                       Gently with a scrungie or clean washcloth.  5.  Apply the CHG Soap to your body ONLY FROM THE NECK DOWN.   Do not use on face/ open  Wound or open sores. Avoid contact with eyes, ears mouth and genitals (private parts).                       Wash face,  Genitals (private parts) with your normal soap.             6.  Wash thoroughly, paying special attention to the area where your surgery  will be performed.  7.  Thoroughly rinse your body with warm water from the neck down.  8.  DO NOT shower/wash with your normal soap after using and rinsing off  the CHG Soap.                9.  Pat yourself dry with a clean towel.            10.  Wear clean pajamas.            11.  Place clean sheets on your bed the night of your first shower and do not  sleep with pets. Day of Surgery : Do not apply any lotions/deodorants the morning of surgery.  Please wear clean clothes to the hospital/surgery center.  FAILURE TO FOLLOW THESE INSTRUCTIONS MAY RESULT IN THE CANCELLATION OF YOUR SURGERY PATIENT SIGNATURE_________________________________  NURSE  SIGNATURE__________________________________  ________________________________________________________________________   Scott Walter  An incentive spirometer is a tool that can help keep your lungs clear and active. This tool measures how well you are filling your lungs with each breath. Taking long deep breaths may help reverse or decrease the chance of developing breathing (pulmonary) problems (especially infection) following: A long period of time when you are unable to move or be active. BEFORE THE PROCEDURE  If the spirometer includes an indicator to show your best effort, your nurse or respiratory therapist will set it to a desired goal. If possible, sit up straight or lean slightly forward. Try not to slouch. Hold the incentive spirometer in an upright position. INSTRUCTIONS FOR USE  Sit on the edge of your bed if possible, or sit up as far as you can in bed or on a chair. Hold the incentive spirometer in an upright position. Breathe out normally. Place the mouthpiece in your mouth and seal your lips tightly around it. Breathe in slowly and as deeply as possible, raising the piston or the ball toward the top of the column. Hold your breath for 3-5 seconds or for as long as possible. Allow the piston or ball to fall to the bottom of the column. Remove the mouthpiece from your mouth and breathe out normally. Rest for a few seconds and repeat Steps 1 through 7 at least 10 times every 1-2 hours when you are awake. Take your time and take a few normal breaths between deep breaths. The spirometer may include an indicator to show your best effort. Use the indicator as a goal to work toward during each repetition. After each set of 10 deep breaths, practice coughing to be sure your lungs are clear. If you have an incision (the cut made at the time of surgery), support your incision when coughing by placing a pillow or rolled up towels firmly against it. Once you are able to get out of  bed, walk around indoors and cough well. You may stop using the incentive spirometer when instructed by your caregiver.  RISKS AND COMPLICATIONS Take your time so you do not get dizzy or light-headed. If you are in pain, you may need to take or  ask for pain medication before doing incentive spirometry. It is harder to take a deep breath if you are having pain. AFTER USE Rest and breathe slowly and easily. It can be helpful to keep track of a log of your progress. Your caregiver can provide you with a simple table to help with this. If you are using the spirometer at home, follow these instructions: [place] IF:  You are having difficultly using the spirometer. You have trouble using the spirometer as often as instructed. Your pain medication is not giving enough relief while using the spirometer. You develop fever of 100.5 F (38.1 C) or higher. SEEK IMMEDIATE MEDICAL CARE IF:  You cough up bloody sputum that had not been present before. You develop fever of 102 F (38.9 C) or greater. You develop worsening pain at or near the incision site. MAKE SURE YOU:  Understand these instructions. Will watch your condition. Will get help right away if you are not doing well or get worse. Document Released: 06/16/2006 Document Revised: 04/28/2011 Document Reviewed: 08/17/2006 [facility] Patient Information 2014 [place], [place].   ________________________________________________________________________

## 2021-09-27 NOTE — Telephone Encounter (Signed)
[employer]- pending clinical review- Case # [id number]- Joylene Igo [phone] authorization through [facility] comminuty Care [phone] ,[phone] Otila Kluver at the [facility] says they have not sent a referral for a sleep study.Patient may need to have his PCP give him a referral so they will not pay for a sleep study.

## 2021-09-30 ENCOUNTER — Encounter (HOSPITAL_COMMUNITY): Payer: Self-pay

## 2021-09-30 ENCOUNTER — Encounter (HOSPITAL_COMMUNITY)
Admission: RE | Admit: 2021-09-30 | Discharge: 2021-09-30 | Disposition: A | Discharge status: Home / Self Care | Payer: Federal, State, Local not specified - PPO | Source: Ambulatory Visit | Attending: Orthopedic Surgery | Admitting: Orthopedic Surgery

## 2021-09-30 ENCOUNTER — Other Ambulatory Visit: Payer: Self-pay

## 2021-09-30 VITALS — BP 152/102 | HR 78 | Temp 98.3°F

## 2021-09-30 DIAGNOSIS — Z01812 Encounter for preprocedural laboratory examination: Secondary | ICD-10-CM | POA: Insufficient documentation

## 2021-09-30 DIAGNOSIS — I10 Essential (primary) hypertension: Secondary | ICD-10-CM | POA: Diagnosis not present

## 2021-09-30 DIAGNOSIS — M1711 Unilateral primary osteoarthritis, right knee: Secondary | ICD-10-CM

## 2021-09-30 DIAGNOSIS — Z01818 Encounter for other preprocedural examination: Secondary | ICD-10-CM

## 2021-09-30 HISTORY — DX: Unspecified osteoarthritis, unspecified site: M19.90

## 2021-09-30 HISTORY — DX: Dyspnea, unspecified: R06.00

## 2021-09-30 HISTORY — DX: Insomnia, unspecified: G47.00

## 2021-09-30 HISTORY — DX: Cardiac arrhythmia, unspecified: I49.9

## 2021-09-30 HISTORY — DX: Unspecified asthma, uncomplicated: J45.909

## 2021-09-30 LAB — BASIC METABOLIC PANEL
Anion gap: 6 (ref 5–15)
BUN: 15 mg/dL (ref 6–20)
CO2: 24 mmol/L (ref 22–32)
Calcium: 8.9 mg/dL (ref 8.9–10.3)
Chloride: 110 mmol/L (ref 98–111)
Creatinine, Ser: 0.91 mg/dL (ref 0.61–1.24)
GFR, Estimated: >60 mL/min (ref >60)
Glucose, Bld: 111 mg/dL — ABNORMAL HIGH (ref 70–99)
Potassium: 3.7 mmol/L (ref 3.5–5.1)
Sodium: 140 mmol/L (ref 135–145)

## 2021-09-30 LAB — CBC
HCT: 43.7 % (ref 39.0–52.0)
Hemoglobin: 15 g/dL (ref 13.0–17.0)
MCH: 31.1 pg (ref 26.0–34.0)
MCHC: 34.3 g/dL (ref 30.0–36.0)
MCV: 90.7 fL (ref 80.0–100.0)
Platelets: 175 10*3/uL (ref 150–400)
RBC: 4.82 MIL/uL (ref 4.22–5.81)
RDW: 14.5 % (ref 11.5–15.5)
WBC: 6.3 10*3/uL (ref 4.0–10.5)
nRBC: 0 % (ref 0.0–0.2)

## 2021-09-30 LAB — SURGICAL PCR SCREEN
MRSA, PCR: NEGATIVE
Staphylococcus aureus: NEGATIVE

## 2021-09-30 NOTE — Telephone Encounter (Signed)
[name]

## 2021-09-30 NOTE — Progress Notes (Signed)
For Short Stay: [place] appointment date: Date of COVID positive in last 82 days:  Bowel Prep reminder:   For Anesthesia: PCP - Dr. Carolann Littler Cardiologist - Dr. Dorris Carnes Clearance: Almyra Deforest: PA: 09/11/21: EPIC Chest x-ray -  EKG - 08/13/21 Stress Test -  ECHO - 07/19/14 Cardiac Cath -  Pacemaker/ICD device last checked: Pacemaker orders received: Device Rep notified:  Spinal Cord Stimulator:  Sleep Study - Yes CPAP - NO  Fasting Blood Sugar -  Checks Blood Sugar _____ times a day Date and result of last Hgb A1c-08/13/21: 5.5  Blood Thinner Instructions: Alen Blew: will be held 3 days before surgery: Dr. Alvan Dame instructions. Aspirin Instructions: Last Dose:  Activity level: Can go up a flight of stairs and activities of daily living without stopping and without chest pain and/or shortness of breath   Able to exercise without chest pain and/or shortness of breath   Unable to go up a flight of stairs without chest pain and/or shortness of breath     Anesthesia review: Hx: HTN,PAF,OSA(NO CPAP),Stroke,TIA.  Patient denies shortness of breath, fever, cough and chest pain at PAT appointment   Patient verbalized understanding of instructions that were given to them at the PAT appointment. Patient was also instructed that they will need to review over the PAT instructions again at home before surgery.

## 2021-10-01 LAB — TYPE AND SCREEN
ABO/RH(D): O POS
Antibody Screen: NEGATIVE

## 2021-10-10 NOTE — H&P (Signed)
PARTIAL KNEE ADMISSION H&P  Patient is being admitted for right knee patellofemoral arthroplasty  Subjective:  Chief Complaint:right knee pain.  HPI: Scott Walter, 51 y.o. male, has a history of pain and functional disability in the right knee due to arthritis and has failed non-surgical conservative treatments for greater than 12 weeks to includeNSAID's and/or analgesics, corticosteriod injections, and activity modification.  Onset of symptoms was gradual, starting 5 years ago with gradually worsening course since that time. The patient noted prior procedures on the knee to include  arthroscopy with meniscal debridement, patellofemoral chondroplasty with open chondral allograft, and patellofemoral open osteochondral autograph  on the right knee(s).  Patient currently rates pain in the right knee(s) at 8 out of 10 with activity. Patient has worsening of pain with activity and weight bearing, pain that interferes with activities of daily living, and pain with passive range of motion.  Patient has evidence of joint space narrowing by imaging studies.  There is no active infection.  Patient Active Problem List   Diagnosis Date Noted   Restrictive lung disease 04/18/2020   Pain in right knee 08/31/2019   Sprain of right ankle 10/20/2018   Chondromalacia of right patella 03/15/2018   OSA (obstructive sleep apnea) 06/10/2017   Anterior to posterior tear of superior glenoid labrum of left shoulder 05/23/2017   Dizziness 05/05/2017   Sensorineural hearing loss (SNHL), bilateral 05/05/2017   Preventative health care 09/06/2014   Testicular discomfort 09/06/2014   Low back pain 09/06/2014   Dyslipidemia    Chest pain 07/18/2014   Visual disturbance 07/18/2014   Paroxysmal atrial fibrillation (HCC) 07/18/2014   Chest pain with moderate risk for cardiac etiology 07/18/2014   Amaurosis fugax of right eye 07/18/2014   TIA (transient ischemic attack)    Shortness of breath 03/02/2013   Chronic  insomnia 03/02/2013   Bipolar disorder ([place]) 03/02/2013   Precordial pain 01/26/2013   Hyperglycemia 01/26/2013   Essential hypertension 06/12/2012   ETOH abuse 06/12/2012   Past Medical History:  Diagnosis Date   Allergy    Anxiety    Arthritis    Asthma    Bipolar disorder ([place])    h/o SI   Chronic insomnia    Chronic lower back pain    Depression    Dyslipidemia    Dyspnea    Dysrhythmia    Essential hypertension    GERD (gastroesophageal reflux disease)    Headache(784.0)    "monthly" (01/26/2013)   History of stomach ulcers ~ 2009   Insomnia    Midsternal chest pain    a. 06/2014 Myoview: EF 50%, no ischemia/infarct.   Migraine    "maybe 2 in my lifetime" (01/26/2013)   PAF (paroxysmal atrial fibrillation) ([place])    a. Dx 05/2012;  b. 06/2014 Xarelto started in setting of ? TIA (CHA2DS2VASc = 3);  c. 06/2014 Echo: EF 60-65%, no rwma.   PTSD (post-traumatic stress disorder)    Sleep apnea    Stroke [facility])     Past Surgical History:  Procedure Laterality Date   EAR CANALOPLASTY Left    KNEE ARTHROSCOPY Right 09/2011   KNEE SURGERY  02/2017   right knee   SHOULDER SURGERY Left    VASECTOMY  09/2017    No current facility-administered medications for this encounter.   Current Outpatient Medications  Medication Sig Dispense Refill Last Dose   Acetaminophen (MIDOL PO) Take 2 tablets by mouth daily as needed (pain).      albuterol (PROVENTIL HFA;VENTOLIN HFA)  108 (90 Base) MCG/ACT inhaler Inhale 2 puffs into the lungs every 6 (six) hours as needed for wheezing or shortness of breath. (Patient taking differently: Inhale 2 puffs into the lungs 2 (two) times daily as needed for wheezing or shortness of breath.) 1 Inhaler 2    Artificial Tear Ointment (DRY EYES OP) Apply 1 Application to eye daily in the afternoon.      atenolol (TENORMIN) 25 MG tablet Take 1 tablet (25 mg total) by mouth daily. 90 tablet 3    Biotin 10000 MCG TABS Take 10,000 Units by mouth daily.       Calcium Carbonate Antacid (TUMS CHEWY BITES PO) Take 2 tablets by mouth as needed (heartburn).      cetirizine (ZYRTEC) 10 MG tablet Take 10 mg by mouth daily.      flecainide (TAMBOCOR) 50 MG tablet TAKE 1.5 TABLETS BY MOUTH 2 TIMES DAILY. (Patient taking differently: Take 50-100 mg by mouth See admin instructions. Take 50 mg by mouth in the morning and 100 mg at bedtime) 270 tablet 3    fluticasone (FLONASE) 50 MCG/ACT nasal spray Place 1 spray into both nostrils daily. (Patient taking differently: Place 1 spray into both nostrils every other day.) 16 g 2    Melatonin 10 MG CAPS Take 20-30 mg by mouth at bedtime.      meloxicam (MOBIC) 15 MG tablet Take 15 mg by mouth daily as needed for pain.      naproxen sodium (ANAPROX) 550 MG tablet Take 1,100 mg by mouth daily as needed (pain).      omeprazole (PRILOSEC) 40 MG capsule Take 40 mg by mouth daily.      ondansetron (ZOFRAN-ODT) 4 MG disintegrating tablet Take 4 mg by mouth as needed for nausea or vomiting.      Tetrahydrozoline HCl (VISINE OP) Place 1 drop into both eyes as needed (redness).      triamcinolone cream (KENALOG) 0.1 % Apply 1 Application topically as needed (exzema).      Vitamin D-Vitamin K (VITAMIN K2-VITAMIN D3 PO) Take 1 tablet by mouth daily.      XARELTO 20 MG TABS tablet Take 20 mg by mouth daily.      omeprazole (PRILOSEC) 20 MG capsule Take 1 capsule (20 mg total) by mouth 2 (two) times daily before a meal. (Patient not taking: Reported on 09/30/2021) 90 capsule 3 Not Taking   Allergies  Allergen Reactions   Ambien [Zolpidem Tartrate] Other (See Comments)    "Nightmares and mood swings. Hallucinations    Divalproex Sodium Anaphylaxis, Shortness Of Breath and Swelling   Valproic Acid Anaphylaxis and Swelling   Aspirin Hives    Social History   Tobacco Use   Smoking status: Former    Packs/day: 0.75    Years: 30.00    Total pack years: 22.50    Types: Cigarettes    Quit date: 03/07/2017    Years since quitting:  4.5   Smokeless tobacco: Former    Types: Chew   Tobacco comments:    01/26/2013 "quit chewing > 20 yr ago"  Substance Use Topics   Alcohol use: Yes    Comment: occ    Family History  Problem Relation Age of Onset   CAD Mother    Diabetes Mother    Emphysema Father    Healthy Sister    Healthy Sister    Healthy Sister    Healthy Sister    Healthy Sister    Healthy Sister  Healthy Sister    Healthy Sister    Healthy Brother    Healthy Brother    Healthy Brother    Healthy Brother    HIV Brother    Colon cancer Maternal Grandfather    Esophageal cancer Neg Hx    Stomach cancer Neg Hx    Pancreatic cancer Neg Hx      Review of Systems  Constitutional:  Negative for chills and fever.  Respiratory:  Negative for cough and shortness of breath.   Cardiovascular:  Negative for chest pain.  Gastrointestinal:  Negative for nausea and vomiting.  Musculoskeletal:  Positive for arthralgias.     Objective:  Physical Exam Well nourished and well developed. General: Alert and oriented x3, cooperative and pleasant, no acute distress. Head: normocephalic, atraumatic, neck supple. Eyes: EOMI.  Musculoskeletal: Right knee exam: No palpable effusion, warmth erythema Tenderness anteriorly with mild crepitation with flexion and tightness to 120 degrees No significant flexion contracture No lower extremity edema, erythema  Calves soft and nontender. Motor function intact in LE. Strength 5/5 LE bilaterally. Neuro: Distal pulses 2+. Sensation to light touch intact in LE.  Vital signs in last 24 hours:    Labs:   Estimated body mass index is 39.63 kg/m as calculated from the following:   Height as of 09/10/21: 5' 8.9" (1.75 m).   Weight as of 09/10/21: 121.4 kg.   Imaging Review Plain radiographs demonstrate severe degenerative joint disease of the patellofemoral compartment of the right knee (s). The overall alignment isneutral. The bone quality appears to be adequate  for age and reported activity level.      Assessment/Plan:  End stage arthritis, right knee   The patient history, physical examination, clinical judgment of the provider and imaging studies are consistent with end stage degenerative joint disease of the right knee(s) and partial  knee arthroplasty is deemed medically necessary. The treatment options including medical management, injection therapy arthroscopy and arthroplasty were discussed at length. The risks and benefits of total knee arthroplasty were presented and reviewed. The risks due to aseptic loosening, infection, stiffness, patella tracking problems, thromboembolic complications and other imponderables were discussed. The patient acknowledged the explanation, agreed to proceed with the plan and consent was signed. Patient is being admitted for inpatient treatment for surgery, pain control, PT, OT, prophylactic antibiotics, VTE prophylaxis, progressive ambulation and ADL's and discharge planning. The patient is planning to be discharged  home.  Therapy Plans: outpatient therapy at [facility] Disposition: Home with wife Planned DVT Prophylaxis: Xarelto '20mg'$  daily DME needed: crutches PCP: Dr. Elease Hashimoto Cardiologist: Dr. [name], clearance received TXA: IV Allergies: Ambien - hallucinations, depakote - anaphylaxis Anesthesia Concerns: none BMI: 39.3 Last HgbA1c: Not diabetic   Other: - OSA - does not use CPAP -- needs to stay overnight - A fib - oxycodone, robaxin, tylenol  Costella Hatcher, PA-C Orthopedic Surgery [facility] [place] [phone]

## 2021-10-17 ENCOUNTER — Other Ambulatory Visit: Payer: Self-pay

## 2021-10-17 ENCOUNTER — Ambulatory Visit (HOSPITAL_COMMUNITY): Payer: Federal, State, Local not specified - PPO | Admitting: Anesthesiology

## 2021-10-17 ENCOUNTER — Observation Stay (HOSPITAL_COMMUNITY)
Admission: RE | Admit: 2021-10-17 | Discharge: 2021-10-18 | Disposition: A | Discharge status: Home / Self Care | Payer: Federal, State, Local not specified - PPO | Attending: Orthopedic Surgery | Admitting: Orthopedic Surgery

## 2021-10-17 ENCOUNTER — Encounter (HOSPITAL_COMMUNITY): Payer: Self-pay | Admitting: Orthopedic Surgery

## 2021-10-17 ENCOUNTER — Encounter (HOSPITAL_COMMUNITY)
Admission: RE | Disposition: A | Discharge status: Home / Self Care | Payer: Self-pay | Source: Home / Self Care | Attending: Orthopedic Surgery

## 2021-10-17 ENCOUNTER — Ambulatory Visit (HOSPITAL_COMMUNITY): Payer: Federal, State, Local not specified - PPO | Admitting: Physician Assistant

## 2021-10-17 DIAGNOSIS — M2241 Chondromalacia patellae, right knee: Secondary | ICD-10-CM | POA: Diagnosis not present

## 2021-10-17 DIAGNOSIS — J45909 Unspecified asthma, uncomplicated: Secondary | ICD-10-CM | POA: Insufficient documentation

## 2021-10-17 DIAGNOSIS — Z01818 Encounter for other preprocedural examination: Secondary | ICD-10-CM

## 2021-10-17 DIAGNOSIS — Z8673 Personal history of transient ischemic attack (TIA), and cerebral infarction without residual deficits: Secondary | ICD-10-CM | POA: Insufficient documentation

## 2021-10-17 DIAGNOSIS — I10 Essential (primary) hypertension: Secondary | ICD-10-CM | POA: Diagnosis not present

## 2021-10-17 DIAGNOSIS — Z87891 Personal history of nicotine dependence: Secondary | ICD-10-CM | POA: Insufficient documentation

## 2021-10-17 DIAGNOSIS — Z79899 Other long term (current) drug therapy: Secondary | ICD-10-CM | POA: Diagnosis not present

## 2021-10-17 DIAGNOSIS — M1711 Unilateral primary osteoarthritis, right knee: Secondary | ICD-10-CM | POA: Diagnosis not present

## 2021-10-17 DIAGNOSIS — M13861 Other specified arthritis, right knee: Secondary | ICD-10-CM | POA: Diagnosis not present

## 2021-10-17 DIAGNOSIS — G8918 Other acute postprocedural pain: Secondary | ICD-10-CM | POA: Diagnosis not present

## 2021-10-17 DIAGNOSIS — I480 Paroxysmal atrial fibrillation: Secondary | ICD-10-CM | POA: Diagnosis not present

## 2021-10-17 DIAGNOSIS — M222X1 Patellofemoral disorders, right knee: Secondary | ICD-10-CM | POA: Diagnosis not present

## 2021-10-17 DIAGNOSIS — G4730 Sleep apnea, unspecified: Secondary | ICD-10-CM | POA: Diagnosis not present

## 2021-10-17 DIAGNOSIS — Z96651 Presence of right artificial knee joint: Secondary | ICD-10-CM

## 2021-10-17 HISTORY — PX: PATELLA-FEMORAL ARTHROPLASTY: SHX5037

## 2021-10-17 LAB — ABO/RH: ABO/RH(D): O POS

## 2021-10-17 LAB — TYPE AND SCREEN
ABO/RH(D): O POS
Antibody Screen: NEGATIVE

## 2021-10-17 SURGERY — ARTHROPLASTY, PATELLOFEMORAL
Anesthesia: Spinal | Site: Knee | Laterality: Right

## 2021-10-17 MED ORDER — CEFAZOLIN IN SODIUM CHLORIDE 3-0.9 GM/100ML-% IV SOLN
3.0000 g | INTRAVENOUS | Status: AC
  Administered 2021-10-17: 3 g via INTRAVENOUS
  Filled 2021-10-17: qty 100

## 2021-10-17 MED ORDER — CHLORHEXIDINE GLUCONATE 0.12 % MT SOLN
15.0000 mL | Freq: Once | OROMUCOSAL | Status: AC
  Administered 2021-10-17: 15 mL via OROMUCOSAL

## 2021-10-17 MED ORDER — BUPIVACAINE-EPINEPHRINE (PF) 0.25% -1:200000 IJ SOLN
INTRAMUSCULAR | Status: DC | PRN
  Administered 2021-10-17: 30 mL

## 2021-10-17 MED ORDER — KETOROLAC TROMETHAMINE 30 MG/ML IJ SOLN
INTRAMUSCULAR | Status: AC
  Filled 2021-10-17: qty 1

## 2021-10-17 MED ORDER — DIPHENHYDRAMINE HCL 12.5 MG/5ML PO ELIX: 12.5000 mg | ORAL_SOLUTION | ORAL | Status: DC | PRN

## 2021-10-17 MED ORDER — DEXAMETHASONE SODIUM PHOSPHATE 10 MG/ML IJ SOLN
10.0000 mg | Freq: Once | INTRAMUSCULAR | Status: AC
Start: 2021-10-18 — End: 2021-10-18
  Administered 2021-10-18: 10 mg via INTRAVENOUS
  Filled 2021-10-17: qty 1

## 2021-10-17 MED ORDER — PANTOPRAZOLE SODIUM 40 MG PO TBEC
40.0000 mg | DELAYED_RELEASE_TABLET | Freq: Every day | ORAL | Status: DC
  Administered 2021-10-17 – 2021-10-18 (×2): 40 mg via ORAL
  Filled 2021-10-17 (×2): qty 1

## 2021-10-17 MED ORDER — OXYCODONE HCL 5 MG PO TABS
10.0000 mg | ORAL_TABLET | ORAL | Status: DC | PRN
  Administered 2021-10-18 (×2): 15 mg via ORAL
  Filled 2021-10-17 (×2): qty 3

## 2021-10-17 MED ORDER — METHOCARBAMOL 1000 MG/10ML IJ SOLN: 500.0000 mg | Freq: Four times a day (QID) | INTRAVENOUS | Status: DC | PRN

## 2021-10-17 MED ORDER — FLECAINIDE ACETATE 100 MG PO TABS
100.0000 mg | ORAL_TABLET | Freq: Every day | ORAL | Status: DC
  Administered 2021-10-17: 100 mg via ORAL
  Filled 2021-10-17: qty 1

## 2021-10-17 MED ORDER — BUPIVACAINE-EPINEPHRINE (PF) 0.25% -1:200000 IJ SOLN
INTRAMUSCULAR | Status: AC
Start: 2021-10-17 — End: ?
  Filled 2021-10-17: qty 30

## 2021-10-17 MED ORDER — 0.9 % SODIUM CHLORIDE (POUR BTL) OPTIME
TOPICAL | Status: DC | PRN
  Administered 2021-10-17: 1000 mL

## 2021-10-17 MED ORDER — ONDANSETRON HCL 4 MG/2ML IJ SOLN
INTRAMUSCULAR | Status: DC | PRN
  Administered 2021-10-17: 4 mg via INTRAVENOUS

## 2021-10-17 MED ORDER — LORATADINE 10 MG PO TABS
10.0000 mg | ORAL_TABLET | Freq: Every day | ORAL | Status: DC
  Administered 2021-10-17 – 2021-10-18 (×2): 10 mg via ORAL
  Filled 2021-10-17 (×2): qty 1

## 2021-10-17 MED ORDER — POVIDONE-IODINE 10 % EX SWAB
2.0000 | Freq: Once | CUTANEOUS | Status: AC
  Administered 2021-10-17: 2 via TOPICAL

## 2021-10-17 MED ORDER — ALBUTEROL SULFATE (2.5 MG/3ML) 0.083% IN NEBU
2.5000 mg | INHALATION_SOLUTION | Freq: Two times a day (BID) | RESPIRATORY_TRACT | Status: DC | PRN
Start: 2021-10-17 — End: 2021-10-18

## 2021-10-17 MED ORDER — LACTATED RINGERS IV SOLN: INTRAVENOUS | Status: DC

## 2021-10-17 MED ORDER — ONDANSETRON HCL 4 MG/2ML IJ SOLN: 4.0000 mg | Freq: Four times a day (QID) | INTRAMUSCULAR | Status: DC | PRN

## 2021-10-17 MED ORDER — DEXAMETHASONE SODIUM PHOSPHATE 10 MG/ML IJ SOLN
INTRAMUSCULAR | Status: AC
  Filled 2021-10-17: qty 1

## 2021-10-17 MED ORDER — SODIUM CHLORIDE 0.9 % IV SOLN: INTRAVENOUS | Status: DC

## 2021-10-17 MED ORDER — HYDROMORPHONE HCL 1 MG/ML IJ SOLN
0.5000 mg | INTRAMUSCULAR | Status: DC | PRN
  Administered 2021-10-17: 1 mg via INTRAVENOUS
  Filled 2021-10-17: qty 1

## 2021-10-17 MED ORDER — ATENOLOL 25 MG PO TABS
25.0000 mg | ORAL_TABLET | Freq: Every day | ORAL | Status: DC
  Administered 2021-10-17 – 2021-10-18 (×2): 25 mg via ORAL
  Filled 2021-10-17 (×2): qty 1

## 2021-10-17 MED ORDER — PROPOFOL 500 MG/50ML IV EMUL
INTRAVENOUS | Status: AC
  Filled 2021-10-17: qty 50

## 2021-10-17 MED ORDER — PROPOFOL 500 MG/50ML IV EMUL
INTRAVENOUS | Status: DC | PRN
  Administered 2021-10-17: 100 ug/kg/min via INTRAVENOUS

## 2021-10-17 MED ORDER — MENTHOL 3 MG MT LOZG: 1.0000 | LOZENGE | OROMUCOSAL | Status: DC | PRN

## 2021-10-17 MED ORDER — PHENOL 1.4 % MT LIQD: 1.0000 | OROMUCOSAL | Status: DC | PRN

## 2021-10-17 MED ORDER — FERROUS SULFATE 325 (65 FE) MG PO TABS
325.0000 mg | ORAL_TABLET | Freq: Three times a day (TID) | ORAL | Status: DC
  Administered 2021-10-18: 325 mg via ORAL
  Filled 2021-10-17: qty 1

## 2021-10-17 MED ORDER — BISACODYL 10 MG RE SUPP: 10.0000 mg | Freq: Every day | RECTAL | Status: DC | PRN

## 2021-10-17 MED ORDER — ONDANSETRON HCL 4 MG/2ML IJ SOLN: 4.0000 mg | Freq: Once | INTRAMUSCULAR | Status: DC | PRN

## 2021-10-17 MED ORDER — ONDANSETRON HCL 4 MG/2ML IJ SOLN
INTRAMUSCULAR | Status: AC
Start: 2021-10-17 — End: ?
  Filled 2021-10-17: qty 2

## 2021-10-17 MED ORDER — RIVAROXABAN 10 MG PO TABS
20.0000 mg | ORAL_TABLET | Freq: Every day | ORAL | Status: DC
  Administered 2021-10-18: 20 mg via ORAL
  Filled 2021-10-17: qty 2

## 2021-10-17 MED ORDER — MIDAZOLAM HCL 2 MG/2ML IJ SOLN
1.0000 mg | INTRAMUSCULAR | Status: DC
  Administered 2021-10-17: 2 mg via INTRAVENOUS
  Filled 2021-10-17: qty 2

## 2021-10-17 MED ORDER — PROPOFOL 10 MG/ML IV BOLUS
INTRAVENOUS | Status: DC | PRN
  Administered 2021-10-17: 20 mg via INTRAVENOUS

## 2021-10-17 MED ORDER — ROPIVACAINE HCL 5 MG/ML IJ SOLN
INTRAMUSCULAR | Status: DC | PRN
  Administered 2021-10-17 (×6): 5 mL via PERINEURAL

## 2021-10-17 MED ORDER — METOCLOPRAMIDE HCL 5 MG/ML IJ SOLN
5.0000 mg | Freq: Three times a day (TID) | INTRAMUSCULAR | Status: DC | PRN
  Administered 2021-10-18: 10 mg via INTRAVENOUS
  Filled 2021-10-17: qty 2

## 2021-10-17 MED ORDER — DOCUSATE SODIUM 100 MG PO CAPS
100.0000 mg | ORAL_CAPSULE | Freq: Two times a day (BID) | ORAL | Status: DC
  Administered 2021-10-17 – 2021-10-18 (×2): 100 mg via ORAL
  Filled 2021-10-17 (×2): qty 1

## 2021-10-17 MED ORDER — MELATONIN 5 MG PO TABS
20.0000 mg | ORAL_TABLET | Freq: Every day | ORAL | Status: DC
  Administered 2021-10-17: 30 mg via ORAL
  Filled 2021-10-17: qty 6

## 2021-10-17 MED ORDER — PROPOFOL 1000 MG/100ML IV EMUL
INTRAVENOUS | Status: AC
Start: 2021-10-17 — End: ?
  Filled 2021-10-17: qty 100

## 2021-10-17 MED ORDER — METHOCARBAMOL 500 MG PO TABS
500.0000 mg | ORAL_TABLET | Freq: Four times a day (QID) | ORAL | Status: DC | PRN
  Administered 2021-10-17 – 2021-10-18 (×2): 500 mg via ORAL
  Filled 2021-10-17 (×2): qty 1

## 2021-10-17 MED ORDER — MEPERIDINE HCL 50 MG/ML IJ SOLN: 6.2500 mg | INTRAMUSCULAR | Status: DC | PRN

## 2021-10-17 MED ORDER — ACETAMINOPHEN 500 MG PO TABS
1000.0000 mg | ORAL_TABLET | Freq: Four times a day (QID) | ORAL | Status: DC
  Administered 2021-10-17 – 2021-10-18 (×4): 1000 mg via ORAL
  Filled 2021-10-17 (×4): qty 2

## 2021-10-17 MED ORDER — PHENYLEPHRINE HCL-NACL 20-0.9 MG/250ML-% IV SOLN
INTRAVENOUS | Status: DC | PRN
  Administered 2021-10-17: 30 ug/min via INTRAVENOUS

## 2021-10-17 MED ORDER — ACETAMINOPHEN 10 MG/ML IV SOLN: 1000.0000 mg | Freq: Once | INTRAVENOUS | Status: DC | PRN

## 2021-10-17 MED ORDER — SODIUM CHLORIDE (PF) 0.9 % IJ SOLN
INTRAMUSCULAR | Status: DC | PRN
  Administered 2021-10-17: 30 mL

## 2021-10-17 MED ORDER — METOCLOPRAMIDE HCL 5 MG PO TABS: 5.0000 mg | ORAL_TABLET | Freq: Three times a day (TID) | ORAL | Status: DC | PRN

## 2021-10-17 MED ORDER — POLYETHYLENE GLYCOL 3350 17 G PO PACK: 17.0000 g | PACK | Freq: Every day | ORAL | Status: DC | PRN

## 2021-10-17 MED ORDER — TRANEXAMIC ACID-NACL 1000-0.7 MG/100ML-% IV SOLN
1000.0000 mg | INTRAVENOUS | Status: AC
  Administered 2021-10-17: 1000 mg via INTRAVENOUS
  Filled 2021-10-17: qty 100

## 2021-10-17 MED ORDER — CEFAZOLIN SODIUM-DEXTROSE 2-4 GM/100ML-% IV SOLN
2.0000 g | Freq: Four times a day (QID) | INTRAVENOUS | Status: AC
  Administered 2021-10-17 (×2): 2 g via INTRAVENOUS
  Filled 2021-10-17 (×2): qty 100

## 2021-10-17 MED ORDER — HYDROMORPHONE HCL 1 MG/ML IJ SOLN: 0.2500 mg | INTRAMUSCULAR | Status: DC | PRN

## 2021-10-17 MED ORDER — CLONIDINE HCL (ANALGESIA) 100 MCG/ML EP SOLN
EPIDURAL | Status: DC | PRN
  Administered 2021-10-17: 100 ug

## 2021-10-17 MED ORDER — KETOROLAC TROMETHAMINE 30 MG/ML IJ SOLN
INTRAMUSCULAR | Status: DC | PRN
  Administered 2021-10-17: 30 mg

## 2021-10-17 MED ORDER — ONDANSETRON HCL 4 MG PO TABS
4.0000 mg | ORAL_TABLET | Freq: Four times a day (QID) | ORAL | Status: DC | PRN
  Administered 2021-10-18: 4 mg via ORAL
  Filled 2021-10-17: qty 1

## 2021-10-17 MED ORDER — FLUTICASONE PROPIONATE 50 MCG/ACT NA SUSP
1.0000 | NASAL | Status: DC
Start: 2021-10-18 — End: 2021-10-18
  Administered 2021-10-18: 1 via NASAL
  Filled 2021-10-17: qty 16

## 2021-10-17 MED ORDER — DEXAMETHASONE SODIUM PHOSPHATE 10 MG/ML IJ SOLN
8.0000 mg | Freq: Once | INTRAMUSCULAR | Status: AC
  Administered 2021-10-17: 8 mg via INTRAVENOUS

## 2021-10-17 MED ORDER — DEXAMETHASONE SODIUM PHOSPHATE 10 MG/ML IJ SOLN
INTRAMUSCULAR | Status: DC | PRN
  Administered 2021-10-17: 10 mg

## 2021-10-17 MED ORDER — DEXMEDETOMIDINE (PRECEDEX) IN NS 20 MCG/5ML (4 MCG/ML) IV SYRINGE
PREFILLED_SYRINGE | INTRAVENOUS | Status: DC | PRN
  Administered 2021-10-17 (×2): 8 ug via INTRAVENOUS

## 2021-10-17 MED ORDER — FENTANYL CITRATE PF 50 MCG/ML IJ SOSY
50.0000 ug | PREFILLED_SYRINGE | INTRAMUSCULAR | Status: DC
  Administered 2021-10-17 (×2): 50 ug via INTRAVENOUS
  Filled 2021-10-17: qty 2

## 2021-10-17 MED ORDER — BUPIVACAINE IN DEXTROSE 0.75-8.25 % IT SOLN
INTRATHECAL | Status: DC | PRN
  Administered 2021-10-17: 2 mL via INTRATHECAL

## 2021-10-17 MED ORDER — FLECAINIDE ACETATE 50 MG PO TABS
50.0000 mg | ORAL_TABLET | Freq: Every day | ORAL | Status: DC
  Administered 2021-10-17 – 2021-10-18 (×2): 50 mg via ORAL
  Filled 2021-10-17 (×2): qty 1

## 2021-10-17 MED ORDER — SODIUM CHLORIDE 0.9 % IR SOLN
Status: DC | PRN
  Administered 2021-10-17: 1000 mL

## 2021-10-17 MED ORDER — FLECAINIDE ACETATE 50 MG PO TABS: 50.0000 mg | ORAL_TABLET | ORAL | Status: DC

## 2021-10-17 MED ORDER — ORAL CARE MOUTH RINSE: 15.0000 mL | Freq: Once | OROMUCOSAL | Status: AC

## 2021-10-17 MED ORDER — SODIUM CHLORIDE (PF) 0.9 % IJ SOLN
INTRAMUSCULAR | Status: AC
  Filled 2021-10-17: qty 50

## 2021-10-17 MED ORDER — OXYCODONE HCL 5 MG PO TABS
5.0000 mg | ORAL_TABLET | ORAL | Status: DC | PRN
  Administered 2021-10-17 – 2021-10-18 (×2): 10 mg via ORAL
  Filled 2021-10-17 (×2): qty 2

## 2021-10-17 MED ORDER — TRANEXAMIC ACID-NACL 1000-0.7 MG/100ML-% IV SOLN
1000.0000 mg | Freq: Once | INTRAVENOUS | Status: AC
  Administered 2021-10-17: 1000 mg via INTRAVENOUS
  Filled 2021-10-17: qty 100

## 2021-10-17 SURGICAL SUPPLY — 49 items
BAG COUNTER SPONGE SURGICOUNT (BAG) IMPLANT
BAG DECANTER FOR FLEXI CONT (MISCELLANEOUS) IMPLANT
BAG ZIPLOCK 12X15 (MISCELLANEOUS) IMPLANT
BLADE SAG 13.0X1.37X90 (BLADE) IMPLANT
BLADE SAW SGTL 11.0X1.19X90.0M (BLADE) IMPLANT
BNDG ELASTIC 6X5.8 VLCR STR LF (GAUZE/BANDAGES/DRESSINGS) ×1 IMPLANT
BOWL SMART MIX CTS (DISPOSABLE) ×1 IMPLANT
BUR SURG PFJ MILL NEXGEN (BURR) IMPLANT
BURR SURG PFJ MILL NEXGEN (BURR) ×1
CEMENT BONE R 1X40 (Cement) IMPLANT
COMP FEM NEXGEN SZ5 +3.5 RT (Knees) ×1 IMPLANT
COMPONENT FEM NEXGN SZ5 +3.5RT (Knees) IMPLANT
COVER SURGICAL LIGHT HANDLE (MISCELLANEOUS) ×1 IMPLANT
CUFF TOURN SGL QUICK 34 (TOURNIQUET CUFF) ×1
CUFF TRNQT CYL 34X4.125X (TOURNIQUET CUFF) ×1 IMPLANT
DERMABOND ADVANCED (GAUZE/BANDAGES/DRESSINGS) ×1
DERMABOND ADVANCED .7 DNX12 (GAUZE/BANDAGES/DRESSINGS) ×1 IMPLANT
DRAPE U-SHAPE 47X51 STRL (DRAPES) ×1 IMPLANT
DRSG AQUACEL AG ADV 3.5X10 (GAUZE/BANDAGES/DRESSINGS) ×1 IMPLANT
DURAPREP 26ML APPLICATOR (WOUND CARE) ×2 IMPLANT
ELECT REM PT RETURN 15FT ADLT (MISCELLANEOUS) ×1 IMPLANT
GLOVE BIO SURGEON STRL SZ 6 (GLOVE) ×2 IMPLANT
GLOVE BIOGEL M 7.0 STRL (GLOVE) ×1 IMPLANT
GLOVE BIOGEL PI IND STRL 7.5 (GLOVE) ×1 IMPLANT
GLOVE BIOGEL PI IND STRL 8.5 (GLOVE) ×1 IMPLANT
GLOVE BIOGEL PI INDICATOR 7.5 (GLOVE) ×1
GLOVE BIOGEL PI INDICATOR 8.5 (GLOVE) ×1
GLOVE ECLIPSE 8.0 STRL XLNG CF (GLOVE) ×1 IMPLANT
GLOVE INDICATOR 6.5 STRL GRN (GLOVE) ×1 IMPLANT
GOWN STRL REUS W/ TWL LRG LVL3 (GOWN DISPOSABLE) ×3 IMPLANT
GOWN STRL REUS W/TWL LRG LVL3 (GOWN DISPOSABLE) ×3
HANDPIECE INTERPULSE COAX TIP (DISPOSABLE) ×1
KIT TURNOVER KIT A (KITS) IMPLANT
MANIFOLD NEPTUNE II (INSTRUMENTS) ×1 IMPLANT
PACK TOTAL KNEE CUSTOM (KITS) ×1 IMPLANT
PROTECTOR NERVE ULNAR (MISCELLANEOUS) ×1 IMPLANT
SCREW HEADED 33MM KNEE (MISCELLANEOUS) IMPLANT
SET HNDPC FAN SPRY TIP SCT (DISPOSABLE) ×1 IMPLANT
SET PAD KNEE POSITIONER (MISCELLANEOUS) ×1 IMPLANT
STEM POLY PAT PLY 35M KNEE (Knees) IMPLANT
SUT MNCRL AB 4-0 PS2 18 (SUTURE) ×1 IMPLANT
SUT STRATAFIX PDS+ 0 24IN (SUTURE) ×1 IMPLANT
SUT VIC AB 1 CT1 36 (SUTURE) ×1 IMPLANT
SUT VIC AB 2-0 CT1 27 (SUTURE) ×1
SUT VIC AB 2-0 CT1 TAPERPNT 27 (SUTURE) ×1 IMPLANT
TRAY FOLEY MTR SLVR 16FR STAT (SET/KITS/TRAYS/PACK) ×1 IMPLANT
TUBE SUCTION HIGH CAP CLEAR NV (SUCTIONS) ×1 IMPLANT
WATER STERILE IRR 1000ML POUR (IV SOLUTION) ×1 IMPLANT
WRAP KNEE MAXI GEL POST OP (GAUZE/BANDAGES/DRESSINGS) ×1 IMPLANT

## 2021-10-17 NOTE — Discharge Instructions (Signed)

## 2021-10-17 NOTE — Transfer of Care (Signed)
Immediate Anesthesia Transfer of Care Note  Patient: Scott Walter  Procedure(s) Performed: PATELLOFEMORAL ARTHROPLASTY (Right: Knee)  Patient Location: PACU  Anesthesia Type:Spinal  Level of Consciousness: awake and patient cooperative  Airway & Oxygen Therapy: Patient Spontanous Breathing and Patient connected to face mask  Post-op Assessment: Report given to RN and Post -op Vital signs reviewed and stable  Post vital signs: Reviewed and stable  Last Vitals:  Vitals Value Taken Time  BP    Temp    Pulse 79 10/17/21 1311  Resp 12 10/17/21 1311  SpO2 95 % 10/17/21 1311    Last Pain:  Vitals:   10/17/21 0913  TempSrc:   PainSc: 5       Patients Stated Pain Goal: 4 (35/67/01 4103)  Complications: No notable events documented.

## 2021-10-17 NOTE — Anesthesia Preprocedure Evaluation (Signed)
Anesthesia Evaluation  Patient identified by MRN, date of birth, ID band Patient awake    Reviewed: Allergy & Precautions, NPO status , Patient's Chart, lab work & pertinent test results, reviewed documented beta blocker date and time   Airway Mallampati: II       Dental no notable dental hx.    Pulmonary asthma , sleep apnea , former smoker,    Pulmonary exam normal        Cardiovascular hypertension, Pt. on home beta blockers Normal cardiovascular exam     Neuro/Psych  Headaches,    GI/Hepatic Neg liver ROS, GERD  Medicated and Controlled,  Endo/Other  negative endocrine ROS  Renal/GU negative Renal ROS  negative genitourinary   Musculoskeletal  (+) Arthritis , Osteoarthritis,    Abdominal (+) + obese,   Peds  Hematology   Anesthesia Other Findings Stress test does not show evidence of blockage. His symptoms may have come from a.fib. Awaiting monitor results. Pt should work on diet, wt loss and ensure good blood pressure control.   Please send copy to Eulas Post, MD  Daune Perch, NP  No atrial fibrillation detected Rhythm was normal sinus rhythm with rare skip Keep on same meds          Reproductive/Obstetrics                             Anesthesia Physical Anesthesia Plan  ASA: 3  Anesthesia Plan: Spinal   Post-op Pain Management: Regional block*   Induction:   PONV Risk Score and Plan: 1 and Midazolam, Ondansetron, Propofol infusion and Treatment may vary due to age or medical condition  Airway Management Planned: Natural Airway and Simple Face Mask  Additional Equipment: None  Intra-op Plan:   Post-operative Plan:   Informed Consent: I have reviewed the patients History and Physical, chart, labs and discussed the procedure including the risks, benefits and alternatives for the proposed anesthesia with the patient or authorized representative who  has indicated his/her understanding and acceptance.     Dental advisory given  Plan Discussed with: CRNA  Anesthesia Plan Comments:         Anesthesia Quick Evaluation

## 2021-10-17 NOTE — Interval H&P Note (Signed)
History and Physical Interval Note:  10/17/2021 9:54 AM  Scott Walter  has presented today for surgery, with the diagnosis of Right patellofemoral osteoarthritis.  The various methods of treatment have been discussed with the patient and family. After consideration of risks, benefits and other options for treatment, the patient has consented to  Procedure(s): PATELLOFEMORAL ARTHROPLASTY (Right) as a surgical intervention.  The patient's history has been reviewed, patient examined, no change in status, stable for surgery.  I have reviewed the patient's chart and labs.  Questions were answered to the patient's satisfaction.     Mauri Pole

## 2021-10-17 NOTE — Evaluation (Signed)
Physical Therapy Evaluation Patient Details Name: Scott Walter MRN: [id number] DOB: 1970-08-05 Today's Date: 10/17/2021  History of Present Illness  Pt s/p R patellofemoral arthroplasty and with hx of Bipolar, Chronic LBP, PAF, PTSD, TIA, Restrictive Lung Disease and ETOH abuse  Clinical Impression  Pt admitted as above and presenting with functional mobility limitations 2* decreased R LE strength/ROM and post op pain.  Pt should progress to dc home with family assist and reports first OP PT scheduled for 10/22/21.     Recommendations for follow up therapy are one component of a multi-disciplinary discharge planning process, led by the attending physician.  Recommendations may be updated based on patient status, additional functional criteria and insurance authorization.  Follow Up Recommendations Follow physician's recommendations for discharge plan and follow up therapies      Assistance Recommended at Discharge Intermittent Supervision/Assistance  Patient can return home with the following  A little help with walking and/or transfers;A little help with bathing/dressing/bathroom;Assist for transportation;Help with stairs or ramp for entrance    Equipment Recommendations  (TBD RW vs crutches)  Recommendations for Other Services       Functional Status Assessment Patient has had a recent decline in their functional status and demonstrates the ability to make significant improvements in function in a reasonable and predictable amount of time.     Precautions / Restrictions Precautions Precautions: Fall;Knee Restrictions Weight Bearing Restrictions: No Other Position/Activity Restrictions: WBAT      Mobility  Bed Mobility Overal bed mobility: Needs Assistance Bed Mobility: Supine to Sit     Supine to sit: Min assist     General bed mobility comments: cues for sequence and use of L LE to self assist    Transfers Overall transfer level: Needs assistance Equipment used:  Rolling walker (2 wheels) Transfers: Sit to/from Stand Sit to Stand: Min assist, From elevated surface           General transfer comment: cues for LE management and use of UEs to self assist    Ambulation/Gait Ambulation/Gait assistance: Min assist Gait Distance (Feet): 21 Feet Assistive device: Rolling walker (2 wheels) Gait Pattern/deviations: Step-to pattern, Decreased step length - right, Decreased step length - left, Shuffle, Trunk flexed Gait velocity: decr     General Gait Details: cues for sequence, posture and position from [employer]            Wheelchair Mobility    Modified Rankin (Stroke Patients Only)       Balance Overall balance assessment: Needs assistance Sitting-balance support: No upper extremity supported, Feet supported Sitting balance-Leahy Scale: Good     Standing balance support: Bilateral upper extremity supported Standing balance-Leahy Scale: Poor                               Pertinent Vitals/Pain Pain Assessment Pain Assessment: 0-10 Pain Score: 3  Pain Location: R knee Pain Descriptors / Indicators: Aching, Sore Pain Intervention(s): Limited activity within patient's tolerance, Monitored during session, Premedicated before session, Ice applied    Home Living Family/patient expects to be discharged to:: Private residence Living Arrangements: Spouse/significant other Available Help at Discharge: Family Type of Home: House Home Access: Level entry     Alternate Level Stairs-Number of Steps: flight Home Layout: Two level Home Equipment: Cane - single point Additional Comments: Pt plans to enter basement at ground level but needs to climb flight of stairs to shower    Prior Function  Prior Level of Function : Independent/Modified Independent             Mobility Comments: using cane       Hand Dominance        Extremity/Trunk Assessment   Upper Extremity Assessment Upper Extremity Assessment:  Overall WFL for tasks assessed    Lower Extremity Assessment Lower Extremity Assessment: RLE deficits/detail RLE Deficits / Details: IND SLR    Cervical / Trunk Assessment Cervical / Trunk Assessment: Normal  Communication   Communication: No difficulties  Cognition Arousal/Alertness: Awake/alert Behavior During Therapy: WFL for tasks assessed/performed Overall Cognitive Status: Within Functional Limits for tasks assessed                                          General Comments      Exercises Total Joint Exercises Ankle Circles/Pumps: AROM, Both, 15 reps, Supine   Assessment/Plan    PT Assessment Patient needs continued PT services  PT Problem List Decreased strength;Decreased range of motion;Decreased activity tolerance;Decreased balance;Decreased mobility;Decreased knowledge of use of DME;Pain       PT Treatment Interventions DME instruction;Gait training;Stair training;Therapeutic exercise;Therapeutic activities;Functional mobility training;Patient/family education;Balance training    PT Goals (Current goals can be found in the Care Plan section)  Acute Rehab PT Goals Patient Stated Goal: Regain IND PT Goal Formulation: With patient Time For Goal Achievement: 10/24/21 Potential to Achieve Goals: Good    Frequency 7X/week     Co-evaluation               AM-PAC PT "6 Clicks" Mobility  Outcome Measure Help needed turning from your back to your side while in a flat bed without using bedrails?: A Little Help needed moving from lying on your back to sitting on the side of a flat bed without using bedrails?: A Little Help needed moving to and from a bed to a chair (including a wheelchair)?: A Little Help needed standing up from a chair using your arms (e.g., wheelchair or bedside chair)?: A Little Help needed to walk in hospital room?: A Little Help needed climbing 3-5 steps with a railing? : A Lot 6 Click Score: 17    End of Session  Equipment Utilized During Treatment: Gait belt Activity Tolerance: Patient tolerated treatment well Patient left: in chair;with call bell/phone within reach;with chair alarm set;with family/visitor present Nurse Communication: Mobility status PT Visit Diagnosis: Unsteadiness on feet (R26.81);Difficulty in walking, not elsewhere classified (R26.2);Pain Pain - Right/Left: Right Pain - part of body: Knee    Time: 1611-1630 PT Time Calculation (min) (ACUTE ONLY): 19 min   Charges:   PT Evaluation $PT Eval Low Complexity: 1 Low          [place] Pager [phone] Office [phone]   Walton Digilio 10/17/2021, 4:58 PM

## 2021-10-17 NOTE — Brief Op Note (Signed)
10/17/2021  11:12 AM  PATIENT:  Scott Walter  51 y.o. male  PRE-OPERATIVE DIAGNOSIS:  Right patellofemoral osteoarthritis  POST-OPERATIVE DIAGNOSIS:  Right patellofemoral osteoarthritis  PROCEDURE:  Procedure(s): PATELLOFEMORAL ARTHROPLASTY (Right)  SURGEON:  Surgeon(s) and Role:    Paralee Cancel, MD - Primary  PHYSICIAN ASSISTANT: Costella Hatcher, PA-C  ANESTHESIA:   regional and spinal  EBL:  <100 cc  BLOOD ADMINISTERED:none  DRAINS: none   LOCAL MEDICATIONS USED:  MARCAINE     SPECIMEN:  No Specimen  DISPOSITION OF SPECIMEN:  N/A  COUNTS:  YES  TOURNIQUET:  30 min at 225 mmHg  DICTATION: .Other Dictation: Dictation Number [id number]  PLAN OF CARE: Admit for overnight observation  PATIENT DISPOSITION:  PACU - hemodynamically stable.   Delay start of Pharmacological VTE agent (>24hrs) due to surgical blood loss or risk of bleeding: no

## 2021-10-17 NOTE — Plan of Care (Signed)
  Problem: Education: Goal: Knowledge of the prescribed therapeutic regimen will improve Outcome: Progressing   Problem: Activity: Goal: Range of joint motion will improve Outcome: Progressing   Problem: Pain Management: Goal: Pain level will decrease with appropriate interventions Outcome: Progressing   Problem: Education: Goal: Knowledge of General Education information will improve Description: Including pain rating scale, medication(s)/side effects and non-pharmacologic comfort measures Outcome: Progressing   Problem: Clinical Measurements: Goal: Ability to maintain clinical measurements within normal limits will improve Outcome: Progressing

## 2021-10-17 NOTE — Op Note (Signed)
NAMEJHOSTIN, EPPS MEDICAL RECORD NO: [id number] ACCOUNT NO: [id number] DATE OF BIRTH: [birth year] FACILITY: [facility] LOCATION: [facility] PHYSICIAN: Pietro Cassis. Alvan Dame, MD  Operative Report   DATE OF PROCEDURE: 10/17/2021   PREOPERATIVE DIAGNOSIS:  Right knee progressive patellofemoral osteoarthritis, status post prior attempts at joint salvage.  POSTOPERATIVE DIAGNOSIS:  Right knee progressive patellofemoral osteoarthritis, status post prior attempts at joint salvage.  PROCEDURE:  Right knee patellofemoral arthroplasty.  COMPONENTS USED:  Biomet Zimmer patellofemoral arthroplasty with a size 5 trochlea shield and a size 35 patellar button.  SURGEON:  Pietro Cassis. Alvan Dame, MD  ASSISTANT:  Costella Hatcher, PA-C.  Note that Ms. Lu Duffel was present for the entirety of the case from preoperative positioning, perioperative management of the operative extremity, general facilitation of the case and primary wound closure.  ANESTHESIA:  Regional plus spinal.  DRAINS:  None.  COMPLICATIONS:  None.   TOURNIQUET:  Up for 30 minutes at 225 mmHg.  INDICATIONS FOR PROCEDURE:  The patient is a 51 year old male referred for evaluation of progressive patellofemoral challenges and pain related to patellofemoral arthritis.  He had a history of prior OATS procedure at patellofemoral joint salvage.  He  had progressive pain and loss of function.  His quality of life was affected.  At this point was desiring to proceed with arthroplasty.  Based on his age and the condition of the remaining part of his knee we felt that he was a good candidate for  patellofemoral arthroplasty.  Risks of infection, DVT, component failure, need for future surgeries related to failure of the implant or progression of osteoarthritis were reviewed.  Consent was obtained for benefit of pain relief.  DESCRIPTION OF PROCEDURE:  The patient was brought to the operative theater.  Once adequate anesthesia, preoperative antibiotics, 3 grams of Ancef  administered, he was positioned supine with a right thigh tourniquet placed.  The right lower extremity was  then prepped and draped in sterile fashion.  A timeout was performed identifying the patient, planned procedure, and extremity.  His old incision was excised and extended slightly proximal and distal.  Soft tissue exposure was obtained.  The median  arthrotomy was made, encountering clear synovial fluid.  Following initial exposure including scar debridement, attention was first directed to the patella.  Noted evidence of mild trochlear disease, but significant fragmentation and disease associated  with his patella undersurface.  With the patella exposed.  I measured and resected and used a 35 patellar button.  The lug holes were drilled and a button was placed to protect the patella from retractors and saw blade throughout the remainder of the  case.  Attention was now directed to femoral preparation.  The femoral canal was opened with a drill, irrigated to try to prevent fat emboli.  Intramedullary rod was passed and the anterior cutting jig was positioned perpendicular to Whiteside's line and  parallel to the epicondylar axis.  It was pinned into position based on an anterior reference.  The saw was used to cut this anterior flange.  We then sized this to be a size 5.  The size 5 milling jig was then pinned into place.  The high-speed bur was  then used to create the bed for the component.  The trial or the next jig was then impacted in position and pinned in place for drill holes.  We then did a trial reduction with the 5 trochlea shield and a 35 patellar button.  We found the patella tracked  through the trochlea without  application of pressure.  At this point, all trial components were removed.  The knee was irrigated with normal saline solution.  The synovial capsular junction knee was injected with 0.25% Marcaine with epinephrine, 1 mL of  Toradol and saline.  Final components were opened.   Cement was mixed.  The final components were then cemented on the clean and dried cut surface of bone.  The knee was held in extension for the cement to cure.  Once the cement had fully cured, the knee  was re-irrigated.  The tourniquet had been let down after 30 minutes.  No significant hemostasis was required.  At this point, the extensor mechanism was reapproximated with the knee in about 40 degrees of flexion using #1 Vicryl and #1 Stratafix  suture.  The remainder of the wound was closed with 2-0 Vicryl and a running Monocryl stitch.  The knee was then cleaned, dried and dressed sterilely using surgical glue and Aquacel dressing.  The patient was brought to the recovery room in stable  condition, tolerating the procedure well.  Plan will be for the patient to be admitted for observation overnight.  Based on his medical history.  He will be discharged home tomorrow.  Physical therapy will be initiated for range of motion, strengthening.   SUJ D: 10/17/2021 12:45:57 pm T: 10/17/2021 9:42:00 pm  JOB: [id number]/ [id number]

## 2021-10-17 NOTE — Plan of Care (Signed)
  Problem: Activity: Goal: Ability to avoid complications of mobility impairment will improve Outcome: Progressing Goal: Range of joint motion will improve Outcome: Progressing   Problem: Pain Management: Goal: Pain level will decrease with appropriate interventions Outcome: Progressing   Problem: Activity: Goal: Risk for activity intolerance will decrease Outcome: Progressing   

## 2021-10-17 NOTE — Anesthesia Postprocedure Evaluation (Signed)
Anesthesia Post Note  Patient: Scott Walter  Procedure(s) Performed: PATELLOFEMORAL ARTHROPLASTY (Right: Knee)     Patient location during evaluation: PACU Anesthesia Type: Spinal Level of consciousness: sedated Pain management: pain level controlled Vital Signs Assessment: post-procedure vital signs reviewed and stable Respiratory status: spontaneous breathing Cardiovascular status: stable Postop Assessment: no headache, no backache, spinal receding, patient able to bend at knees and no apparent nausea or vomiting Anesthetic complications: no   No notable events documented.  Last Vitals:  Vitals:   10/17/21 1315 10/17/21 1330  BP: 108/64 102/66  Pulse: 77 65  Resp: 13 14  Temp:    SpO2: 96% 97%    Last Pain:  Vitals:   10/17/21 1330  TempSrc:   PainSc: Scott Walter

## 2021-10-17 NOTE — Anesthesia Procedure Notes (Signed)
Anesthesia Regional Block: Adductor canal block   Pre-Anesthetic Checklist: , timeout performed,  Correct Patient, Correct Site, Correct Laterality,  Correct Procedure, Correct Position, site marked,  Risks and benefits discussed,  Surgical consent,  Pre-op evaluation,  At surgeon's request and post-op pain management  Laterality: Lower and Right  Prep: chloraprep       Needles:  Injection technique: Single-shot  Needle Type: Echogenic Stimulator Needle     Needle Length: 9cm  Needle Gauge: 20   Needle insertion depth: 3.5 cm   Additional Needles:   Procedures:,,,, ultrasound used (permanent image in chart),,    Narrative:  Start time: 10/17/2021 11:10 AM End time: 10/17/2021 11:20 AM Injection made incrementally with aspirations every 5 mL.  Performed by: Personally  Anesthesiologist: Lyn Hollingshead, MD

## 2021-10-17 NOTE — Anesthesia Procedure Notes (Signed)
Spinal  Patient location during procedure: OR Start time: 10/17/2021 11:33 AM End time: 10/17/2021 11:36 AM Reason for block: surgical anesthesia Staffing Performed: resident/CRNA  Anesthesiologist: Lyn Hollingshead, MD Resident/CRNA: Claudia Desanctis, CRNA Performed by: Claudia Desanctis, CRNA Authorized by: Lyn Hollingshead, MD   Preanesthetic Checklist Completed: patient identified, IV checked, site marked, risks and benefits discussed, surgical consent, monitors and equipment checked, pre-op evaluation and timeout performed Spinal Block Patient position: sitting Prep: DuraPrep Patient monitoring: heart rate, cardiac monitor, continuous pulse ox and blood pressure Approach: midline Location: L3-4 Injection technique: single-shot Needle Needle type: Pencan  Needle gauge: 24 G Needle length: 10 cm Needle insertion depth: 9 cm Assessment Sensory level: T4 Events: CSF return Additional Notes IV functioning, monitors applied to pt. Expiration date of kit checked and confirmed to be in date. Sterile prep and drape, hand hygiene and sterile gloved used. Pt was positioned and spine was prepped in sterile fashion. Skin was anesthetized with lidocaine. Free flow of clear CSF obtained prior to injecting local anesthetic into CSF x 1 attempt. Spinal needle aspirated freely following injection. Needle was carefully withdrawn, and pt tolerated procedure well. Loss of motor and sensory on exam post injection.

## 2021-10-18 ENCOUNTER — Encounter (HOSPITAL_COMMUNITY): Payer: Self-pay | Admitting: Orthopedic Surgery

## 2021-10-18 DIAGNOSIS — M1711 Unilateral primary osteoarthritis, right knee: Secondary | ICD-10-CM | POA: Diagnosis not present

## 2021-10-18 DIAGNOSIS — Z79899 Other long term (current) drug therapy: Secondary | ICD-10-CM | POA: Diagnosis not present

## 2021-10-18 DIAGNOSIS — J45909 Unspecified asthma, uncomplicated: Secondary | ICD-10-CM | POA: Diagnosis not present

## 2021-10-18 DIAGNOSIS — Z87891 Personal history of nicotine dependence: Secondary | ICD-10-CM | POA: Diagnosis not present

## 2021-10-18 DIAGNOSIS — Z8673 Personal history of transient ischemic attack (TIA), and cerebral infarction without residual deficits: Secondary | ICD-10-CM | POA: Diagnosis not present

## 2021-10-18 DIAGNOSIS — I480 Paroxysmal atrial fibrillation: Secondary | ICD-10-CM | POA: Diagnosis not present

## 2021-10-18 DIAGNOSIS — I10 Essential (primary) hypertension: Secondary | ICD-10-CM | POA: Diagnosis not present

## 2021-10-18 LAB — BASIC METABOLIC PANEL
Anion gap: 9 (ref 5–15)
BUN: 16 mg/dL (ref 6–20)
CO2: 23 mmol/L (ref 22–32)
Calcium: 9.4 mg/dL (ref 8.9–10.3)
Chloride: 108 mmol/L (ref 98–111)
Creatinine, Ser: 0.85 mg/dL (ref 0.61–1.24)
GFR, Estimated: >60 mL/min (ref >60)
Glucose, Bld: 157 mg/dL — ABNORMAL HIGH (ref 70–99)
Potassium: 4.9 mmol/L (ref 3.5–5.1)
Sodium: 140 mmol/L (ref 135–145)

## 2021-10-18 LAB — CBC
HCT: 43.1 % (ref 39.0–52.0)
Hemoglobin: 14.4 g/dL (ref 13.0–17.0)
MCH: 31 pg (ref 26.0–34.0)
MCHC: 33.4 g/dL (ref 30.0–36.0)
MCV: 92.7 fL (ref 80.0–100.0)
Platelets: 164 10*3/uL (ref 150–400)
RBC: 4.65 MIL/uL (ref 4.22–5.81)
RDW: 14.6 % (ref 11.5–15.5)
WBC: 13.3 10*3/uL — ABNORMAL HIGH (ref 4.0–10.5)
nRBC: 0 % (ref 0.0–0.2)

## 2021-10-18 MED ORDER — ACETAMINOPHEN 500 MG PO TABS: 1000.0000 mg | ORAL_TABLET | Freq: Four times a day (QID) | ORAL | 0 refills | Status: AC

## 2021-10-18 MED ORDER — POLYETHYLENE GLYCOL 3350 17 G PO PACK: 17.0000 g | PACK | Freq: Every day | ORAL | 0 refills | Status: AC | PRN

## 2021-10-18 MED ORDER — METHOCARBAMOL 500 MG PO TABS: 500.0000 mg | ORAL_TABLET | Freq: Four times a day (QID) | ORAL | 0 refills | Status: AC | PRN

## 2021-10-18 MED ORDER — OXYCODONE HCL 5 MG PO TABS: 5.0000 mg | ORAL_TABLET | ORAL | 0 refills | Status: AC | PRN

## 2021-10-18 MED ORDER — DOCUSATE SODIUM 100 MG PO CAPS: 100.0000 mg | ORAL_CAPSULE | Freq: Two times a day (BID) | ORAL | 0 refills | Status: AC

## 2021-10-18 NOTE — Progress Notes (Signed)
Physical Therapy Treatment Patient Details Name: Scott Walter MRN: [id number] DOB: [birth year] Today's Date: 10/18/2021   History of Present Illness Pt s/p R patellofemoral arthroplasty and with hx of Bipolar, Chronic LBP, PAF, PTSD, TIA, Restrictive Lung Disease and ETOH abuse    PT Comments    Pt seen POD1, reporting 3/10 pain and RN gave pain meds at start of PT session. Pt demonstrated modified independence with bed mobility, required min guard for transfers and supervision for ambulation in hallway with bilateral crutches. Pt educated on stair mobility, completed flight of stairs with min guard, no overt LOB noted. Provided HEP and pt completed supine portion of exercises with safe technique; educated pt on appropriate progression. All educated completed and pt has no further questions. Pt has met mobility goals for safe discharge home. PT is signing off, should needs change, please reconsult. Thank you for this referral.    Recommendations for follow up therapy are one component of a multi-disciplinary discharge planning process, led by the attending physician.  Recommendations may be updated based on patient status, additional functional criteria and insurance authorization.  Follow Up Recommendations  Follow physician's recommendations for discharge plan and follow up therapies     Assistance Recommended at Discharge Intermittent Supervision/Assistance  Patient can return home with the following A little help with walking and/or transfers;A little help with bathing/dressing/bathroom;Assist for transportation;Help with stairs or ramp for entrance   Equipment Recommendations  Crutches    Recommendations for Other Services       Precautions / Restrictions Precautions Precautions: Fall;Knee Restrictions Weight Bearing Restrictions: No Other Position/Activity Restrictions: WBAT     Mobility  Bed Mobility Overal bed mobility: Needs Assistance Bed Mobility: Supine to Sit      Supine to sit: Modified independent (Device/Increase time)     General bed mobility comments: increased time    Transfers Overall transfer level: Needs assistance Equipment used: Crutches Transfers: Sit to/from Stand Sit to Stand: Min guard           General transfer comment: Min guard for safety only    Ambulation/Gait Ambulation/Gait assistance: Supervision Gait Distance (Feet): 100 Feet Assistive device: Crutches Gait Pattern/deviations: Step-to pattern, Decreased step length - right, Decreased step length - left Gait velocity: decr     General Gait Details: Pt ambulated 131f using crutches with 4-point gait pattern, min guard progressed to supervision, no overt LOB noted or physical assist required.   Stairs Stairs: Yes Stairs assistance: Min guard Stair Management: One rail Right, Step to pattern, Forwards, With crutches Number of Stairs: 10 General stair comments: Pt educated on stair management using two crutches, verbalized understanding, handout provided. Demonstrated safe technique with minimal cuing, no physical assist required or overt LOB noted. Standing rest break at top and bottom of stairs ~60s, pt reporting mild SOB that resolved quickly. Also provided handout on SPC + stair mobility.   Wheelchair Mobility    Modified Rankin (Stroke Patients Only)       Balance Overall balance assessment: Needs assistance Sitting-balance support: No upper extremity supported, Feet supported Sitting balance-Leahy Scale: Good     Standing balance support: Bilateral upper extremity supported Standing balance-Leahy Scale: Poor                              Cognition Arousal/Alertness: Awake/alert Behavior During Therapy: WFL for tasks assessed/performed Overall Cognitive Status: Within Functional Limits for tasks assessed  Exercises Total Joint Exercises Ankle Circles/Pumps: AROM, Both,  15 reps, Seated Quad Sets: AROM, Right, 10 reps, Seated Short Arc Quad: AROM, Right, 10 reps Heel Slides: AROM, Right, 10 reps Hip ABduction/ADduction: AROM, Right, 10 reps Straight Leg Raises: AROM, Left, 10 reps Long Arc Quad: Other (comment) (educated not performed) Knee Flexion: Other (comment) (educated not performed) Goniometric ROM: -5-60 by visual approximation    General Comments        Pertinent Vitals/Pain Pain Assessment Pain Assessment: 0-10 Pain Score: 3  Pain Location: R knee Pain Descriptors / Indicators: Aching, Sore Pain Intervention(s): Monitored during session, Repositioned, RN gave pain meds during session, Ice applied    Home Living                          Prior Function            PT Goals (current goals can now be found in the care plan section) Acute Rehab PT Goals Patient Stated Goal: Regain IND PT Goal Formulation: With patient Time For Goal Achievement: 10/24/21 Potential to Achieve Goals: Good Progress towards PT goals: Progressing toward goals    Frequency    7X/week      PT Plan Current plan remains appropriate    Co-evaluation              AM-PAC PT "6 Clicks" Mobility   Outcome Measure  Help needed turning from your back to your side while in a flat bed without using bedrails?: None Help needed moving from lying on your back to sitting on the side of a flat bed without using bedrails?: None Help needed moving to and from a bed to a chair (including a wheelchair)?: A Little Help needed standing up from a chair using your arms (e.g., wheelchair or bedside chair)?: A Little Help needed to walk in hospital room?: A Little Help needed climbing 3-5 steps with a railing? : A Little 6 Click Score: 20    End of Session Equipment Utilized During Treatment: Gait belt Activity Tolerance: Patient tolerated treatment well Patient left: in chair;with call bell/phone within reach;with family/visitor present Nurse  Communication: Mobility status PT Visit Diagnosis: Unsteadiness on feet (R26.81);Difficulty in walking, not elsewhere classified (R26.2);Pain Pain - Right/Left: Right Pain - part of body: Knee     Time: 1855-0158 PT Time Calculation (min) (ACUTE ONLY): 40 min  Charges:  $Gait Training: 23-37 mins $Therapeutic Exercise: 8-22 mins                     Coolidge Breeze, PT, DPT [facility] Office: [phone] Pager: [phone]   Coolidge Breeze 10/18/2021, 11:55 AM

## 2021-10-18 NOTE — Plan of Care (Signed)
  Problem: Activity: Goal: Ability to avoid complications of mobility impairment will improve Outcome: Progressing Goal: Range of joint motion will improve Outcome: Progressing   Problem: Pain Management: Goal: Pain level will decrease with appropriate interventions Outcome: Progressing   

## 2021-10-18 NOTE — Progress Notes (Signed)
Subjective: 1 Day Post-Op Procedure(s) (LRB): PATELLOFEMORAL ARTHROPLASTY (Right) Patient reports pain as mild.   Patient seen in rounds with Dr. Alvan Dame. Patient is well, and has had no acute complaints or problems. No acute events overnight. Foley catheter removed. Patient ambulated 21 feet with PT.  We will continue therapy today.   Objective: Vital signs in last 24 hours: Temp:  [97.5 F (36.4 C)-98.9 F (37.2 C)] 98.1 F (36.7 C) (09/01 0611) Pulse Rate:  [60-79] 75 (09/01 0611) Resp:  [12-18] 17 (09/01 0611) BP: (102-160)/(64-100) 130/93 (09/01 0611) SpO2:  [94 %-99 %] 97 % (09/01 0611) Weight:  [120.2 kg] 120.2 kg (08/31 0911)  Intake/Output from previous day:  Intake/Output Summary (Last 24 hours) at 10/18/2021 0758 Last data filed at 10/18/2021 0600 Gross per 24 hour  Intake 3275 ml  Output 1750 ml  Net 1525 ml     Intake/Output this shift: No intake/output data recorded.  Labs: Recent Labs    10/18/21 0350  HGB 14.4   Recent Labs    10/18/21 0350  WBC 13.3*  RBC 4.65  HCT 43.1  PLT 164   Recent Labs    10/18/21 0350  NA 140  K 4.9  CL 108  CO2 23  BUN 16  CREATININE 0.85  GLUCOSE 157*  CALCIUM 9.4   No results for input(s): "LABPT", "INR" in the last 72 hours.  Exam: General - Patient is Alert and Oriented Extremity - Neurologically intact Sensation intact distally Intact pulses distally Dorsiflexion/Plantar flexion intact Dressing - dressing C/D/I Motor Function - intact, moving foot and toes well on exam.   Past Medical History:  Diagnosis Date   Allergy    Anxiety    Arthritis    Asthma    Bipolar disorder ([place])    h/o SI   Chronic insomnia    Chronic lower back pain    Depression    Dyslipidemia    Dyspnea    Dysrhythmia    Essential hypertension    GERD (gastroesophageal reflux disease)    Headache(784.0)    "monthly" (01/26/2013)   History of stomach ulcers ~ 2009   Insomnia    Midsternal chest pain    a. 06/2014  Myoview: EF 50%, no ischemia/infarct.   Migraine    "maybe 2 in my lifetime" (01/26/2013)   PAF (paroxysmal atrial fibrillation) ([place])    a. Dx 05/2012;  b. 06/2014 Xarelto started in setting of ? TIA (CHA2DS2VASc = 3);  c. 06/2014 Echo: EF 60-65%, no rwma.   PTSD (post-traumatic stress disorder)    Sleep apnea    Stroke ([place])     Assessment/Plan: 1 Day Post-Op Procedure(s) (LRB): PATELLOFEMORAL ARTHROPLASTY (Right) Principal Problem:   S/P right unicompartmental knee replacement  Estimated body mass index is 38.57 kg/m as calculated from the following:   Height as of this encounter: 5' 9.5" (1.765 m).   Weight as of this encounter: 120.2 kg. Advance diet Up with therapy D/C IV fluids   Patient's anticipated LOS is less than 2 midnights, meeting these requirements: - Younger than 53 - Lives within 1 hour of care - Has a competent adult at home to recover with post-op recover - NO history of  - Chronic pain requiring opiods  - Diabetes  - Coronary Artery Disease  - Heart failure  - Heart attack  - Stroke  - DVT/VTE  - Cardiac arrhythmia  - Respiratory Failure/COPD  - Renal failure  - Anemia  - Advanced Liver disease  DVT Prophylaxis - Xarelto Weight bearing as tolerated.  Hgb stable at 14.4 this morning.  Plan is to go Home after hospital stay. Plan for discharge today following 1-2 sessions of PT as long as they are meeting their goals. Patient is scheduled for OPPT. Follow up in the office in 2 weeks.   Griffith Citron, PA-C Orthopedic Surgery [phone] 10/18/2021, 7:58 AM

## 2021-10-18 NOTE — TOC Transition Note (Signed)
Transition of Care [facility]) - CM/SW Discharge Note  Patient Details  Name: Brae Gartman MRN: [id number] Date of Birth: 1970-12-11  [facility] Care [facility]) CM/SW Contact:  Sherie Don, LCSW Phone Number: 10/18/2021, 9:56 AM  Clinical Narrative: Patient is expected to discharge home after working with PT. CSW met with patient to review discharge plan and needs. Patient will go home with OPPT at [facility]. Patient will need crutches as these are easier for him to use with his stairs at home, but he also asked about getting a rolling walker as well. CSW notified by [employer] that patient declined to private pay for the walker. CSW confirmed with patient he will only be getting the crutches. RN updated. Ortho tech to deliver crutches to patient. TOC signing off.    Final next level of care: OP Rehab Barriers to Discharge: No Barriers Identified  Patient Goals and CMS Choice Patient states their goals for this hospitalization and ongoing recovery are:: Discharge home with OPPT at [facility] Choice offered to / list presented to : NA  Discharge Plan and Services       DME Arranged: N/A DME Agency: NA  Readmission Risk Interventions     No data to display

## 2021-10-18 NOTE — Progress Notes (Signed)
Discharge package printed and instructions given to patient. Patient verbalizes understanding. 

## 2021-10-18 NOTE — Progress Notes (Signed)
Orthopedic Tech Progress Note Patient Details:  Scott Walter 1971/02/16 [id number]  Ortho Devices Type of Ortho Device: Crutches Ortho Device/Splint Interventions: Ordered, Adjustment      [name] [name] 10/18/2021, 11:45 AM

## 2021-10-22 DIAGNOSIS — M25561 Pain in right knee: Secondary | ICD-10-CM | POA: Diagnosis not present

## 2021-10-24 DIAGNOSIS — M25561 Pain in right knee: Secondary | ICD-10-CM | POA: Diagnosis not present

## 2021-10-24 NOTE — Discharge Summary (Addendum)
Patient ID: Scott Walter MRN: [id number] DOB/AGE: 1970-03-22 51 y.o.  Admit date: 10/17/2021 Discharge date: 10/18/2021  Admission Diagnoses:  Right knee patellofemoral compartment osteoarthritis  Discharge Diagnoses:  Principal Problem:   S/P right unicompartmental knee replacement   Past Medical History:  Diagnosis Date   Allergy    Anxiety    Arthritis    Asthma    Bipolar disorder ([place])    h/o SI   Chronic insomnia    Chronic lower back pain    Depression    Dyslipidemia    Dyspnea    Dysrhythmia    Essential hypertension    GERD (gastroesophageal reflux disease)    Headache(784.0)    "monthly" (01/26/2013)   History of stomach ulcers ~ 2009   Insomnia    Midsternal chest pain    a. 06/2014 Myoview: EF 50%, no ischemia/infarct.   Migraine    "maybe 2 in my lifetime" (01/26/2013)   PAF (paroxysmal atrial fibrillation) ([place])    a. Dx 05/2012;  b. 06/2014 Xarelto started in setting of ? TIA (CHA2DS2VASc = 3);  c. 06/2014 Echo: EF 60-65%, no rwma.   PTSD (post-traumatic stress disorder)    Sleep apnea    Stroke [facility])     Surgeries: Procedure(s): PATELLOFEMORAL ARTHROPLASTY on 10/17/2021   Consultants:   Discharged Condition: Improved  Hospital Course: Demetric Dunnaway is an 51 y.o. male who was admitted 10/17/2021 for operative treatment ofS/P right unicompartmental knee replacement. Patient has severe unremitting pain that affects sleep, daily activities, and work/hobbies. After pre-op clearance the patient was taken to the operating room on 10/17/2021 and underwent  Procedure(s): PATELLOFEMORAL ARTHROPLASTY.    Patient was given perioperative antibiotics:  Anti-infectives (From admission, onward)    Start     Dose/Rate Route Frequency Ordered Stop   10/17/21 1800  ceFAZolin (ANCEF) IVPB 2g/100 mL premix        2 g 200 mL/hr over 30 Minutes Intravenous Every 6 hours 10/17/21 1537 10/18/21 0006   10/17/21 0900  ceFAZolin (ANCEF) IVPB 3g/100 mL premix        3 g 200  mL/hr over 30 Minutes Intravenous On call to O.R. 10/17/21 0854 10/17/21 1131        Patient was given sequential compression devices, early ambulation, and chemoprophylaxis to prevent DVT. Patient worked with PT and was meeting their goals regarding safe ambulation and transfers.  Patient benefited maximally from hospital stay and there were no complications.    Recent vital signs: No data found.   Recent laboratory studies: No results for input(s): "WBC", "HGB", "HCT", "PLT", "NA", "K", "CL", "CO2", "BUN", "CREATININE", "GLUCOSE", "INR", "CALCIUM" in the last 72 hours.  Invalid input(s): "PT", "2"   Discharge Medications:   Allergies as of 10/18/2021       Reactions   Ambien [zolpidem Tartrate] Other (See Comments)   "Nightmares and mood swings. Hallucinations    Divalproex Sodium Anaphylaxis, Shortness Of Breath, Swelling   Valproic Acid Anaphylaxis, Swelling   Aspirin Hives        Medication List     STOP taking these medications    MIDOL PO Replaced by: acetaminophen 500 MG tablet   naproxen sodium 550 MG tablet Commonly known as: ANAPROX       TAKE these medications    acetaminophen 500 MG tablet Commonly known as: TYLENOL Take 2 tablets (1,000 mg total) by mouth every 6 (six) hours. Replaces: MIDOL PO   albuterol 108 (90 Base) MCG/ACT inhaler Commonly known as: VENTOLIN  HFA Inhale 2 puffs into the lungs every 6 (six) hours as needed for wheezing or shortness of breath. What changed: when to take this   atenolol 25 MG tablet Commonly known as: TENORMIN Take 1 tablet (25 mg total) by mouth daily.   Biotin 10000 MCG Tabs Take 10,000 Units by mouth daily.   cetirizine 10 MG tablet Commonly known as: ZYRTEC Take 10 mg by mouth daily.   docusate sodium 100 MG capsule Commonly known as: COLACE Take 1 capsule (100 mg total) by mouth 2 (two) times daily.   DRY EYES OP Apply 1 Application to eye daily in the afternoon.   flecainide 50 MG  tablet Commonly known as: TAMBOCOR TAKE 1.5 TABLETS BY MOUTH 2 TIMES DAILY. What changed: See the new instructions.   fluticasone 50 MCG/ACT nasal spray Commonly known as: FLONASE Place 1 spray into both nostrils daily. What changed: when to take this   Melatonin 10 MG Caps Take 20-30 mg by mouth at bedtime.   meloxicam 15 MG tablet Commonly known as: MOBIC Take 15 mg by mouth daily as needed for pain.   methocarbamol 500 MG tablet Commonly known as: ROBAXIN Take 1 tablet (500 mg total) by mouth every 6 (six) hours as needed for muscle spasms.   omeprazole 20 MG capsule Commonly known as: PRILOSEC Take 1 capsule (20 mg total) by mouth 2 (two) times daily before a meal.   omeprazole 40 MG capsule Commonly known as: PRILOSEC Take 40 mg by mouth daily.   ondansetron 4 MG disintegrating tablet Commonly known as: ZOFRAN-ODT Take 4 mg by mouth as needed for nausea or vomiting.   oxyCODONE 5 MG immediate release tablet Commonly known as: Oxy IR/ROXICODONE Take 1-2 tablets (5-10 mg total) by mouth every 4 (four) hours as needed for severe pain.   polyethylene glycol 17 g packet Commonly known as: MIRALAX / GLYCOLAX Take 17 g by mouth daily as needed for mild constipation.   triamcinolone cream 0.1 % Commonly known as: KENALOG Apply 1 Application topically as needed (exzema).   TUMS CHEWY BITES PO Take 2 tablets by mouth as needed (heartburn).   VISINE OP Place 1 drop into both eyes as needed (redness).   VITAMIN K2-VITAMIN D3 PO Take 1 tablet by mouth daily.   Xarelto 20 MG Tabs tablet Generic drug: rivaroxaban Take 20 mg by mouth daily.               Discharge Care Instructions  (From admission, onward)           Start     Ordered   10/18/21 0000  Change dressing       Comments: Maintain surgical dressing until follow up in the clinic. If the edges start to pull up, may reinforce with tape. If the dressing is no longer working, may remove and cover  with gauze and tape, but must keep the area dry and clean.  Call with any questions or concerns.   10/18/21 0807            Diagnostic Studies: No results found.  Disposition: Discharge disposition: 01-Home or Self Care       Discharge Instructions     Call MD / Call 911   Complete by: As directed    If you experience chest pain or shortness of breath, CALL 911 and be transported to the hospital emergency room.  If you develope a fever above 101 F, pus (white drainage) or increased drainage or redness at  the wound, or calf pain, call your surgeon's office.   Change dressing   Complete by: As directed    Maintain surgical dressing until follow up in the clinic. If the edges start to pull up, may reinforce with tape. If the dressing is no longer working, may remove and cover with gauze and tape, but must keep the area dry and clean.  Call with any questions or concerns.   Constipation Prevention   Complete by: As directed    Drink plenty of fluids.  Prune juice may be helpful.  You may use a stool softener, such as Colace (over the counter) 100 mg twice a day.  Use MiraLax (over the counter) for constipation as needed.   Diet - low sodium heart healthy   Complete by: As directed    Increase activity slowly as tolerated   Complete by: As directed    Weight bearing as tolerated with assist device (walker, cane, etc) as directed, use it as long as suggested by your surgeon or therapist, typically at least 4-6 weeks.   Post-operative opioid taper instructions:   Complete by: As directed    POST-OPERATIVE OPIOID TAPER INSTRUCTIONS: It is important to wean off of your opioid medication as soon as possible. If you do not need pain medication after your surgery it is ok to stop day one. Opioids include: Codeine, Hydrocodone(Norco, Vicodin), Oxycodone(Percocet, oxycontin) and hydromorphone amongst others.  Long term and even short term use of opiods can cause: Increased pain  response Dependence Constipation Depression Respiratory depression And more.  Withdrawal symptoms can include Flu like symptoms Nausea, vomiting And more Techniques to manage these symptoms Hydrate well Eat regular healthy meals Stay active Use relaxation techniques(deep breathing, meditating, yoga) Do Not substitute Alcohol to help with tapering If you have been on opioids for less than two weeks and do not have pain than it is ok to stop all together.  Plan to wean off of opioids This plan should start within one week post op of your joint replacement. Maintain the same interval or time between taking each dose and first decrease the dose.  Cut the total daily intake of opioids by one tablet each day Next start to increase the time between doses. The last dose that should be eliminated is the evening dose.      TED hose   Complete by: As directed    Use stockings (TED hose) for 2 weeks on both leg(s).  You may remove them at night for sleeping.        Follow-up Information     Paralee Cancel, MD. Schedule an appointment as soon as possible for a visit in 2 week(s).   Specialty: Orthopedic Surgery Contact information: [address] [place] [address] [phone]                  Signed: Irving Copas 10/24/2021, 6:59 AM

## 2021-10-29 NOTE — Telephone Encounter (Signed)
The patient has been notified of the result. Left detailed message on voicemail and informed patient to call back..Taisei Bonnette Green, CMA   

## 2021-10-30 DIAGNOSIS — M25561 Pain in right knee: Secondary | ICD-10-CM | POA: Diagnosis not present

## 2021-11-03 ENCOUNTER — Encounter: Payer: Self-pay | Admitting: Family Medicine

## 2021-11-07 DIAGNOSIS — M25561 Pain in right knee: Secondary | ICD-10-CM | POA: Diagnosis not present

## 2021-11-11 DIAGNOSIS — M25561 Pain in right knee: Secondary | ICD-10-CM | POA: Diagnosis not present

## 2021-11-15 ENCOUNTER — Encounter (HOSPITAL_BASED_OUTPATIENT_CLINIC_OR_DEPARTMENT_OTHER): Payer: Federal, State, Local not specified - PPO | Admitting: Cardiology

## 2021-11-28 DIAGNOSIS — Z5189 Encounter for other specified aftercare: Secondary | ICD-10-CM | POA: Diagnosis not present

## 2021-12-11 ENCOUNTER — Encounter (HOSPITAL_BASED_OUTPATIENT_CLINIC_OR_DEPARTMENT_OTHER): Payer: Federal, State, Local not specified - PPO | Admitting: Cardiology

## 2021-12-15 ENCOUNTER — Ambulatory Visit (HOSPITAL_BASED_OUTPATIENT_CLINIC_OR_DEPARTMENT_OTHER): Payer: Federal, State, Local not specified - PPO | Attending: Cardiology | Admitting: Cardiology

## 2021-12-15 VITALS — Ht 70.0 in | Wt 260.0 lb

## 2021-12-15 DIAGNOSIS — I10 Essential (primary) hypertension: Secondary | ICD-10-CM | POA: Diagnosis not present

## 2021-12-15 DIAGNOSIS — G4733 Obstructive sleep apnea (adult) (pediatric): Secondary | ICD-10-CM | POA: Diagnosis not present

## 2021-12-16 ENCOUNTER — Encounter (HOSPITAL_BASED_OUTPATIENT_CLINIC_OR_DEPARTMENT_OTHER): Payer: Federal, State, Local not specified - PPO | Admitting: Cardiology

## 2021-12-16 NOTE — Procedures (Signed)
   Patient Name: Scott Walter, Scott Walter Date: 12/15/2021 Gender: Male D.O.B: 03/21/1970 Age (years): 72 Referring Provider: Fransico Him MD, ABSM Height (inches): 70 Interpreting Physician: Fransico Him MD, ABSM Weight (lbs): 260 RPSGT: Baxter Flattery BMI: 37 MRN: [id number] Neck Size: 17.50  CLINICAL INFORMATION The patient is referred for a CPAP titration to treat sleep apnea.  SLEEP STUDY TECHNIQUE As per the AASM Manual for the Scoring of Sleep and Associated Events v2.3 (April 2016) with a hypopnea requiring 4% desaturations.  The channels recorded and monitored were frontal, central and occipital EEG, electrooculogram (EOG), submentalis EMG (chin), nasal and oral airflow, thoracic and abdominal wall motion, anterior tibialis EMG, snore microphone, electrocardiogram, and pulse oximetry. Continuous positive airway pressure (CPAP) was initiated at the beginning of the study and titrated to treat sleep-disordered breathing.  MEDICATIONS Medications self-administered by patient taken the night of the study : N/A  TECHNICIAN COMMENTS Comments added by technician: Patient had difficulty initiating sleep. Comments added by scorer: N/A  RESPIRATORY PARAMETERS Optimal PAP Pressure (cm): 12  AHI at Optimal Pressure (/hr):0 Overall Minimal O2 (%):73.0  Supine % at Optimal Pressure (%):0 Minimal O2 at Optimal Pressure (%): 92.0   SLEEP ARCHITECTURE The study was initiated at 11:25:01 PM and ended at 5:21:35 AM.  Sleep onset time was 4.0 minutes and the sleep efficiency was 97.1%. The total sleep time was 346.1 minutes.  The patient spent 1.3% of the night in stage N1 sleep, 66.2% in stage N2 sleep, 0.0% in stage N3 and 32.5% in REM.Stage REM latency was 60.0 minutes  Wake after sleep onset was 6.5. Alpha intrusion was absent. Supine sleep was 13.00%.  CARDIAC DATA The 2 lead EKG demonstrated sinus rhythm. The mean heart rate was 63.9 beats per minute. Other EKG findings include:  None.  LEG MOVEMENT DATA The total Periodic Limb Movements of Sleep (PLMS) were 0. The PLMS index was 0.0. A PLMS index of <15 is considered normal in adults.  IMPRESSIONS - The optimal PAP pressure was 12 cm of water. - Central sleep apnea was not noted during this titration (CAI = 0.5/h). - Severe oxygen desaturations were observed during this titration (min O2 = 73.0%). - No snoring was audible during this study. - No cardiac abnormalities were observed during this study. - Clinically significant periodic limb movements were not noted during this study. Arousals associated with PLMs were rare.  DIAGNOSIS - Obstructive Sleep Apnea (G47.33)  RECOMMENDATIONS - Trial of CPAP therapy on 12 cm H2O with a Large size Fisher&Paykel Full Face Simplus mask and heated humidification. - Avoid alcohol, sedatives and other CNS depressants that may worsen sleep apnea and disrupt normal sleep architecture. - Sleep hygiene should be reviewed to assess factors that may improve sleep quality. - Weight management and regular exercise should be initiated or continued. - Return to Sleep Center for re-evaluation after 6 weeks of therapy  [Electronically signed] 12/16/2021 09:15 AM  Fransico Him MD, ABSM Diplomate, American Board of Sleep Medicine

## 2022-01-07 ENCOUNTER — Telehealth: Payer: Self-pay | Admitting: *Deleted

## 2022-01-07 NOTE — Telephone Encounter (Signed)
-----   Message from Lauralee Evener, [place] sent at 12/17/2021  8:06 AM EDT -----  ----- Message ----- From: Sueanne Margarita, MD Sent: 12/16/2021   9:17 AM EDT To: Cv Div Sleep Studies  Please let patient know that they had a successful PAP titration and let DME know that orders are in EPIC.  Please set up 6 week OV with me.

## 2022-01-07 NOTE — Telephone Encounter (Signed)
The patient has been notified of the result. Left detailed message on voicemail and informed patient to call back..Lakelynn Severtson Green, CMA   

## 2022-01-12 ENCOUNTER — Other Ambulatory Visit: Payer: Self-pay | Admitting: Family Medicine

## 2022-01-12 ENCOUNTER — Encounter: Payer: Self-pay | Admitting: Internal Medicine

## 2022-01-13 ENCOUNTER — Other Ambulatory Visit: Payer: Self-pay | Admitting: Pharmacist

## 2022-01-13 MED ORDER — XARELTO 20 MG PO TABS: 20.0000 mg | ORAL_TABLET | Freq: Every day | ORAL | 5 refills | Status: DC

## 2022-01-13 MED ORDER — ATENOLOL 25 MG PO TABS: 25.0000 mg | ORAL_TABLET | Freq: Every day | ORAL | 2 refills | Status: DC

## 2022-01-14 ENCOUNTER — Encounter: Payer: Self-pay | Admitting: Family Medicine

## 2022-01-24 DIAGNOSIS — G4730 Sleep apnea, unspecified: Secondary | ICD-10-CM | POA: Diagnosis not present

## 2022-01-24 DIAGNOSIS — E669 Obesity, unspecified: Secondary | ICD-10-CM | POA: Diagnosis not present

## 2022-01-24 DIAGNOSIS — F5104 Psychophysiologic insomnia: Secondary | ICD-10-CM | POA: Diagnosis not present

## 2022-02-25 ENCOUNTER — Encounter: Payer: Self-pay | Admitting: Internal Medicine

## 2022-02-25 NOTE — Telephone Encounter (Signed)
Letter dictated    Please forward to patient

## 2022-02-26 ENCOUNTER — Encounter: Payer: Self-pay | Admitting: Internal Medicine

## 2022-02-26 NOTE — Progress Notes (Signed)
Cardiology Office Note   Date:  02/26/2022   ID:  Scott Walter, DOB Apr 25, 1970, MRN [id number]  PCP:  Eulas Post, MD  Cardiologist:   Dorris Carnes, MD       History of Present Illness: Scott Walter is a 52 y.o. male with a history of       No outpatient medications have been marked as taking for the 02/26/22 encounter (Letter (Out)) with Fay Records, MD.     Allergies:   Ambien [zolpidem tartrate], Divalproex sodium, Valproic acid, and Aspirin   Past Medical History:  Diagnosis Date   Allergy    Anxiety    Arthritis    Asthma    Bipolar disorder ([place])    h/o SI   Chronic insomnia    Chronic lower back pain    Depression    Dyslipidemia    Dyspnea    Dysrhythmia    Essential hypertension    GERD (gastroesophageal reflux disease)    Headache(784.0)    "monthly" (01/26/2013)   History of stomach ulcers ~ 2009   Insomnia    Midsternal chest pain    a. 06/2014 Myoview: EF 50%, no ischemia/infarct.   Migraine    "maybe 2 in my lifetime" (01/26/2013)   PAF (paroxysmal atrial fibrillation) ([place])    a. Dx 05/2012;  b. 06/2014 Xarelto started in setting of ? TIA (CHA2DS2VASc = 3);  c. 06/2014 Echo: EF 60-65%, no rwma.   PTSD (post-traumatic stress disorder)    Sleep apnea    Stroke [facility])     Past Surgical History:  Procedure Laterality Date   EAR CANALOPLASTY Left    KNEE ARTHROSCOPY Right 09/2011   KNEE SURGERY  02/2017   right knee   PATELLA-FEMORAL ARTHROPLASTY Right 10/17/2021   Procedure: PATELLOFEMORAL ARTHROPLASTY;  Surgeon: Paralee Cancel, MD;  Location: [facility] ORS;  Service: Orthopedics;  Laterality: Right;   SHOULDER SURGERY Left    VASECTOMY  09/2017     Social History:  The patient  reports that he quit smoking about 4 years ago. His smoking use included cigarettes. He has a 22.50 pack-year smoking history. He has quit using smokeless tobacco.  His smokeless tobacco use included chew. He reports current alcohol use. He reports that he does not  use drugs.   Family History:  The patient's family history includes CAD in his mother; Colon cancer in his maternal grandfather; Diabetes in his mother; Emphysema in his father; HIV in his brother; Healthy in his brother, brother, brother, brother, sister, sister, sister, sister, sister, sister, sister, and sister.    ROS:  Please see the history of present illness. All other systems are reviewed and  Negative to the above problem except as noted.    PHYSICAL EXAM: VS:  There were no vitals taken for this visit.  GEN: Well nourished, well developed, in no acute distress  HEENT: normal  Neck: no JVD, carotid bruits, or masses Cardiac: RRR; no murmurs, rubs, or gallops,no edema  Respiratory:  clear to auscultation bilaterally, normal work of breathing GI: soft, nontender, nondistended, + BS  No hepatomegaly  MS: no deformity Moving all extremities   Skin: warm and dry, no rash Neuro:  Strength and sensation are intact Psych: euthymic mood, full affect   EKG:  EKG is ordered today.   Lipid Panel    Component Value Date/Time   CHOL 206 (H) 08/13/2021 1031   TRIG 105 08/13/2021 1031   HDL 54 08/13/2021 1031   CHOLHDL  3.8 08/13/2021 1031   CHOLHDL 4 07/28/2016 1546   VLDL 37.4 07/28/2016 1546   LDLCALC 133 (H) 08/13/2021 1031      Wt Readings from Last 3 Encounters:  12/15/21 260 lb (117.9 kg)  10/17/21 265 lb (120.2 kg)  09/10/21 267 lb 9.6 oz (121.4 kg)      ASSESSMENT AND PLAN:     Current medicines are reviewed at length with the patient today.  The patient does not have concerns regarding medicines.  Signed, Dorris Carnes, MD  02/26/2022 9:05 AM    [address], [place], [place]  [address] Phone: [phone]; Fax: [phone]

## 2022-03-24 DIAGNOSIS — G4733 Obstructive sleep apnea (adult) (pediatric): Secondary | ICD-10-CM | POA: Diagnosis not present

## 2022-03-25 DIAGNOSIS — G4733 Obstructive sleep apnea (adult) (pediatric): Secondary | ICD-10-CM | POA: Diagnosis not present

## 2022-05-09 DIAGNOSIS — G4700 Insomnia, unspecified: Secondary | ICD-10-CM | POA: Diagnosis not present

## 2022-05-09 DIAGNOSIS — Z79899 Other long term (current) drug therapy: Secondary | ICD-10-CM | POA: Diagnosis not present

## 2022-05-09 DIAGNOSIS — F32A Depression, unspecified: Secondary | ICD-10-CM | POA: Diagnosis not present

## 2022-05-09 DIAGNOSIS — Z5181 Encounter for therapeutic drug level monitoring: Secondary | ICD-10-CM | POA: Diagnosis not present

## 2022-05-09 DIAGNOSIS — M25561 Pain in right knee: Secondary | ICD-10-CM | POA: Diagnosis not present

## 2022-05-12 DIAGNOSIS — R9431 Abnormal electrocardiogram [ECG] [EKG]: Secondary | ICD-10-CM | POA: Diagnosis not present

## 2022-05-12 DIAGNOSIS — F419 Anxiety disorder, unspecified: Secondary | ICD-10-CM | POA: Diagnosis not present

## 2022-05-12 DIAGNOSIS — I10 Essential (primary) hypertension: Secondary | ICD-10-CM | POA: Diagnosis not present

## 2022-05-12 DIAGNOSIS — G4700 Insomnia, unspecified: Secondary | ICD-10-CM | POA: Diagnosis not present

## 2022-05-12 DIAGNOSIS — F32A Depression, unspecified: Secondary | ICD-10-CM | POA: Diagnosis not present

## 2022-05-26 DIAGNOSIS — F4310 Post-traumatic stress disorder, unspecified: Secondary | ICD-10-CM | POA: Diagnosis not present

## 2022-05-26 DIAGNOSIS — F331 Major depressive disorder, recurrent, moderate: Secondary | ICD-10-CM | POA: Diagnosis not present

## 2022-06-09 DIAGNOSIS — F17290 Nicotine dependence, other tobacco product, uncomplicated: Secondary | ICD-10-CM | POA: Diagnosis not present

## 2022-06-09 DIAGNOSIS — F4312 Post-traumatic stress disorder, chronic: Secondary | ICD-10-CM | POA: Diagnosis not present

## 2022-06-09 DIAGNOSIS — F319 Bipolar disorder, unspecified: Secondary | ICD-10-CM | POA: Diagnosis not present

## 2022-06-09 DIAGNOSIS — Z716 Tobacco abuse counseling: Secondary | ICD-10-CM | POA: Diagnosis not present

## 2022-07-09 ENCOUNTER — Emergency Department (HOSPITAL_COMMUNITY): Payer: Federal, State, Local not specified - PPO

## 2022-07-09 ENCOUNTER — Emergency Department (HOSPITAL_COMMUNITY)
Admission: EM | Admit: 2022-07-09 | Discharge: 2022-07-09 | Disposition: A | Discharge status: Home / Self Care | Payer: Federal, State, Local not specified - PPO | Attending: Emergency Medicine | Admitting: Emergency Medicine

## 2022-07-09 ENCOUNTER — Other Ambulatory Visit: Payer: Self-pay

## 2022-07-09 ENCOUNTER — Encounter (HOSPITAL_COMMUNITY): Payer: Self-pay

## 2022-07-09 DIAGNOSIS — Z7951 Long term (current) use of inhaled steroids: Secondary | ICD-10-CM | POA: Diagnosis not present

## 2022-07-09 DIAGNOSIS — I480 Paroxysmal atrial fibrillation: Secondary | ICD-10-CM | POA: Diagnosis not present

## 2022-07-09 DIAGNOSIS — R0602 Shortness of breath: Secondary | ICD-10-CM | POA: Insufficient documentation

## 2022-07-09 DIAGNOSIS — I10 Essential (primary) hypertension: Secondary | ICD-10-CM | POA: Insufficient documentation

## 2022-07-09 DIAGNOSIS — Z79899 Other long term (current) drug therapy: Secondary | ICD-10-CM | POA: Insufficient documentation

## 2022-07-09 DIAGNOSIS — R059 Cough, unspecified: Secondary | ICD-10-CM | POA: Insufficient documentation

## 2022-07-09 DIAGNOSIS — R931 Abnormal findings on diagnostic imaging of heart and coronary circulation: Secondary | ICD-10-CM | POA: Diagnosis not present

## 2022-07-09 DIAGNOSIS — J45909 Unspecified asthma, uncomplicated: Secondary | ICD-10-CM | POA: Diagnosis not present

## 2022-07-09 DIAGNOSIS — Z1152 Encounter for screening for COVID-19: Secondary | ICD-10-CM | POA: Diagnosis not present

## 2022-07-09 DIAGNOSIS — I517 Cardiomegaly: Secondary | ICD-10-CM | POA: Diagnosis not present

## 2022-07-09 DIAGNOSIS — R9431 Abnormal electrocardiogram [ECG] [EKG]: Secondary | ICD-10-CM | POA: Diagnosis not present

## 2022-07-09 DIAGNOSIS — Z7901 Long term (current) use of anticoagulants: Secondary | ICD-10-CM | POA: Insufficient documentation

## 2022-07-09 LAB — BASIC METABOLIC PANEL
Anion gap: 9 (ref 5–15)
BUN: 15 mg/dL (ref 6–20)
CO2: 25 mmol/L (ref 22–32)
Calcium: 9.1 mg/dL (ref 8.9–10.3)
Chloride: 103 mmol/L (ref 98–111)
Creatinine, Ser: 0.81 mg/dL (ref 0.61–1.24)
GFR, Estimated: >60 mL/min (ref >60)
Glucose, Bld: 97 mg/dL (ref 70–99)
Potassium: 4 mmol/L (ref 3.5–5.1)
Sodium: 137 mmol/L (ref 135–145)

## 2022-07-09 LAB — CBC WITH DIFFERENTIAL/PLATELET
Abs Immature Granulocytes: 0.02 10*3/uL (ref 0.00–0.07)
Basophils Absolute: 0 10*3/uL (ref 0.0–0.1)
Basophils Relative: 0 %
Eosinophils Absolute: 0.1 10*3/uL (ref 0.0–0.5)
Eosinophils Relative: 1 %
HCT: 45.3 % (ref 39.0–52.0)
Hemoglobin: 15.2 g/dL (ref 13.0–17.0)
Immature Granulocytes: 0 %
Lymphocytes Relative: 32 %
Lymphs Abs: 2.3 10*3/uL (ref 0.7–4.0)
MCH: 30 pg (ref 26.0–34.0)
MCHC: 33.6 g/dL (ref 30.0–36.0)
MCV: 89.5 fL (ref 80.0–100.0)
Monocytes Absolute: 0.5 10*3/uL (ref 0.1–1.0)
Monocytes Relative: 7 %
Neutro Abs: 4.4 10*3/uL (ref 1.7–7.7)
Neutrophils Relative %: 60 %
Platelets: 180 10*3/uL (ref 150–400)
RBC: 5.06 MIL/uL (ref 4.22–5.81)
RDW: 14.6 % (ref 11.5–15.5)
WBC: 7.3 10*3/uL (ref 4.0–10.5)
nRBC: 0 % (ref 0.0–0.2)

## 2022-07-09 LAB — SARS CORONAVIRUS 2 BY RT PCR: SARS Coronavirus 2 by RT PCR: NEGATIVE

## 2022-07-09 LAB — TROPONIN I (HIGH SENSITIVITY): Troponin I (High Sensitivity): 3 ng/L (ref <18)

## 2022-07-09 LAB — BRAIN NATRIURETIC PEPTIDE: B Natriuretic Peptide: 10.4 pg/mL (ref 0.0–100.0)

## 2022-07-09 LAB — D-DIMER, QUANTITATIVE: D-Dimer, Quant: 0.4 ug/mL-FEU (ref 0.00–0.50)

## 2022-07-09 MED ORDER — IPRATROPIUM-ALBUTEROL 0.5-2.5 (3) MG/3ML IN SOLN
3.0000 mL | Freq: Once | RESPIRATORY_TRACT | Status: AC
  Administered 2022-07-09: 3 mL via RESPIRATORY_TRACT
  Filled 2022-07-09: qty 3

## 2022-07-09 MED ORDER — PREDNISONE 20 MG PO TABS: 40.0000 mg | ORAL_TABLET | Freq: Every day | ORAL | 0 refills | Status: AC

## 2022-07-09 MED ORDER — BENZONATATE 100 MG PO CAPS: 100.0000 mg | ORAL_CAPSULE | Freq: Three times a day (TID) | ORAL | 0 refills | Status: AC

## 2022-07-09 MED ORDER — ALBUTEROL SULFATE HFA 108 (90 BASE) MCG/ACT IN AERS: 1.0000 | INHALATION_SPRAY | Freq: Four times a day (QID) | RESPIRATORY_TRACT | 0 refills | Status: AC | PRN

## 2022-07-09 MED ORDER — LORAZEPAM 1 MG PO TABS
1.0000 mg | ORAL_TABLET | Freq: Once | ORAL | Status: AC
  Administered 2022-07-09: 1 mg via ORAL
  Filled 2022-07-09: qty 1

## 2022-07-09 MED ORDER — METHYLPREDNISOLONE SODIUM SUCC 125 MG IJ SOLR
125.0000 mg | Freq: Every day | INTRAMUSCULAR | Status: DC
  Administered 2022-07-09: 125 mg via INTRAVENOUS
  Filled 2022-07-09: qty 2

## 2022-07-09 NOTE — Discharge Instructions (Signed)
It was a pleasure taking care of you today.  As discussed, all of your labs are reassuring.  This could be related to an asthma attack.  I am sending you home with steroids, cough medication, and a refill on your albuterol.  Please follow-up with PCP within 2 to 3 days.  Return to the ER for new or worsening symptoms.

## 2022-07-09 NOTE — ED Triage Notes (Signed)
[facility] x1 week that is worse with ambulation  Hx anxiety, afib and cardiomegaly.  Denies blood thinner usage.  No respiratory distress noted. Patient talking in full sentences.

## 2022-07-09 NOTE — ED Provider Notes (Signed)
[place] EMERGENCY DEPARTMENT AT [facility] Provider Note   CSN: [id number] Arrival date & time: 07/09/22  1554     History  Chief Complaint  Patient presents with   Shortness of Breath    Scott Walter is a 52 y.o. male with a past medical history significant for paroxysmal A-fib, bipolar disorder, hypertension, hyperlipidemia, history of CVA, and asthma who presents to the ED due to worsening shortness of breath x 1 week.  Patient states symptoms coincided with a dry cough.  Patient states cough improved however, shortness of breath worsened over the past week.  No fever or chills.  Admits to bilateral lower extremity edema.  No history of blood clots.  Patient is currently on Xarelto which he has been compliant with.  Denies associated chest pain.  He admits to orthopnea.  Sleeps in a chair.  Patient has a history of sleep apnea and suppose to use CPAP machine however, has been unable to use it for the past few days due to significant shortness of breath.  No history of CHF.  Patient notes he has been wheezing significantly over the past few days.  He has been using his albuterol inhaler with improvement in symptoms however, symptoms quickly return.  He notes he used his albuterol inhaler 4 times yesterday.  Used it earlier today as well.  Shortness of breath both at rest and with exertion.  Patient also states he has been feeling significantly anxious over the past week.  Patient recently placed on risperidone.  Denies SI, HI, and auditory/visual hallucinations.  History obtained from patient and past medical records. No interpreter used during encounter.       Home Medications Prior to Admission medications   Medication Sig Start Date End Date Taking? Authorizing Provider  albuterol (VENTOLIN HFA) 108 (90 Base) MCG/ACT inhaler Inhale 1-2 puffs into the lungs every 6 (six) hours as needed for wheezing or shortness of breath. 07/09/22  Yes Jakory Matsuo C, PA-C   benzonatate (TESSALON) 100 MG capsule Take 1 capsule (100 mg total) by mouth every 8 (eight) hours. 07/09/22  Yes Kyndle Schlender, Merla Riches, PA-C  predniSONE (DELTASONE) 20 MG tablet Take 2 tablets (40 mg total) by mouth daily for 5 days. 07/09/22 07/14/22 Yes Tamani Durney, Merla Riches, PA-C  acetaminophen (TYLENOL) 500 MG tablet Take 2 tablets (1,000 mg total) by mouth every 6 (six) hours. 10/18/21   Cassandria Anger, PA-C  albuterol (PROVENTIL HFA;VENTOLIN HFA) 108 (90 Base) MCG/ACT inhaler Inhale 2 puffs into the lungs every 6 (six) hours as needed for wheezing or shortness of breath. Patient taking differently: Inhale 2 puffs into the lungs 2 (two) times daily as needed for wheezing or shortness of breath. 07/21/16   Pricilla Riffle, MD  Artificial Tear Ointment (DRY EYES OP) Apply 1 Application to eye daily in the afternoon.    [provider]  atenolol (TENORMIN) 25 MG tablet Take 1 tablet (25 mg total) by mouth daily. 01/13/22   Pricilla Riffle, MD  Biotin 62130 MCG TABS Take 10,000 Units by mouth daily.    [provider]  Calcium Carbonate Antacid (TUMS CHEWY BITES PO) Take 2 tablets by mouth as needed (heartburn).    [provider]  cetirizine (ZYRTEC) 10 MG tablet Take 10 mg by mouth daily.    [provider]  docusate sodium (COLACE) 100 MG capsule Take 1 capsule (100 mg total) by mouth 2 (two) times daily. 10/18/21   Cassandria Anger, PA-C  flecainide (TAMBOCOR) 50 MG tablet TAKE 1.5 TABLETS BY MOUTH 2 TIMES DAILY. Patient taking differently: Take 50-100 mg by mouth See admin instructions. Take 50 mg by mouth in the morning and 100 mg at bedtime 08/28/21   Pricilla Riffle, MD  fluticasone [facility]) 50 MCG/ACT nasal spray Place 1 spray into both nostrils daily. Patient taking differently: Place 1 spray into both nostrils every other day. 04/29/17   Petrucelli, Pleas Koch, PA-C  Melatonin 10 MG CAPS Take 20-30 mg by mouth at bedtime.    [provider]  meloxicam  (MOBIC) 15 MG tablet Take 15 mg by mouth daily as needed for pain. 09/24/21   [provider]  methocarbamol (ROBAXIN) 500 MG tablet Take 1 tablet (500 mg total) by mouth every 6 (six) hours as needed for muscle spasms. 10/18/21   Cassandria Anger, PA-C  omeprazole (PRILOSEC) 20 MG capsule Take 1 capsule (20 mg total) by mouth 2 (two) times daily before a meal. Patient not taking: Reported on 09/30/2021 06/04/17   Pricilla Riffle, MD  omeprazole (PRILOSEC) 40 MG capsule Take 40 mg by mouth daily. 09/02/21   [provider]  ondansetron (ZOFRAN-ODT) 4 MG disintegrating tablet Take 4 mg by mouth as needed for nausea or vomiting.    [provider]  oxyCODONE (OXY IR/ROXICODONE) 5 MG immediate release tablet Take 1-2 tablets (5-10 mg total) by mouth every 4 (four) hours as needed for severe pain. 10/18/21   Cassandria Anger, PA-C  polyethylene glycol (MIRALAX / GLYCOLAX) 17 g packet Take 17 g by mouth daily as needed for mild constipation. 10/18/21   Cassandria Anger, PA-C  Tetrahydrozoline HCl (VISINE OP) Place 1 drop into both eyes as needed (redness).    [provider]  triamcinolone cream (KENALOG) 0.1 % Apply 1 Application topically as needed (exzema).    [provider]  Vitamin D-Vitamin K (VITAMIN K2-VITAMIN D3 PO) Take 1 tablet by mouth daily.    [provider]  XARELTO 20 MG TABS tablet Take 1 tablet (20 mg total) by mouth daily with supper. 01/13/22   Pricilla Riffle, MD      Allergies    Ambien [zolpidem tartrate], Divalproex sodium, Valproic acid, Zolpidem, and Aspirin    Review of Systems   Review of Systems  Constitutional:  Negative for fever.  Respiratory:  Positive for cough, shortness of breath and wheezing.   Cardiovascular:  Positive for leg swelling. Negative for chest pain.  Gastrointestinal:  Negative for abdominal pain.  Psychiatric/Behavioral:  The patient is nervous/anxious.     Physical Exam Updated Vital Signs BP (!)  167/116   Pulse 78   Temp 97.9 F (36.6 C)   Resp 18   Wt 117 kg   SpO2 96%   BMI 37.01 kg/m  Physical Exam Vitals and nursing note reviewed.  Constitutional:      General: He is not in acute distress.    Appearance: He is not ill-appearing.  HENT:     Head: Normocephalic.  Eyes:     Pupils: Pupils are equal, round, and reactive to light.  Cardiovascular:     Rate and Rhythm: Normal rate and regular rhythm.     Pulses: Normal pulses.     Heart sounds: Normal heart sounds. No murmur heard.    No friction rub. No gallop.  Pulmonary:     Effort: Pulmonary effort is normal.     Breath sounds: Normal breath sounds.  Comments: Respirations equal and unlabored, patient able to speak in full sentences, lungs clear to auscultation bilaterally Abdominal:     General: Abdomen is flat. There is no distension.     Palpations: Abdomen is soft.     Tenderness: There is no abdominal tenderness. There is no guarding or rebound.  Musculoskeletal:        General: Normal range of motion.     Cervical back: Neck supple.     Comments: Trace edema around sock line bilaterally.  Skin:    General: Skin is warm and dry.  Neurological:     General: No focal deficit present.     Mental Status: He is alert.  Psychiatric:        Mood and Affect: Mood normal.        Behavior: Behavior normal.     ED Results / Procedures / Treatments   Labs (all labs ordered are listed, but only abnormal results are displayed) Labs Reviewed  SARS CORONAVIRUS 2 BY RT PCR  CBC WITH DIFFERENTIAL/PLATELET  BASIC METABOLIC PANEL  BRAIN NATRIURETIC PEPTIDE  D-DIMER, QUANTITATIVE  TROPONIN I (HIGH SENSITIVITY)    EKG None  Radiology DG Chest Portable 1 View  Result Date: 07/09/2022 CLINICAL DATA:  Shortness of breath x1 week. EXAM: PORTABLE CHEST 1 VIEW COMPARISON:  April 18, 2020 FINDINGS: The heart size and mediastinal contours are within normal limits. A trace amount of linear scarring and/or  atelectasis is seen within the right lung base. There is no evidence of acute infiltrate, pleural effusion or pneumothorax. The visualized skeletal structures are unremarkable. IMPRESSION: No active disease. Electronically Signed   By: Aram Candela M.D.   On: 07/09/2022 18:20    Procedures Procedures    Medications Ordered in ED Medications  methylPREDNISolone sodium succinate (SOLU-MEDROL) 125 mg/2 mL injection 125 mg (125 mg Intravenous Given 07/09/22 1839)  ipratropium-albuterol (DUONEB) 0.5-2.5 (3) MG/3ML nebulizer solution 3 mL (3 mLs Nebulization Given 07/09/22 1949)  LORazepam (ATIVAN) tablet 1 mg (1 mg Oral Given 07/09/22 1841)    ED Course/ Medical Decision Making/ A&P                             Medical Decision Making Amount and/or Complexity of Data Reviewed Independent Historian: spouse External Data Reviewed: notes.    Details: [facility] note Labs: ordered. Decision-making details documented in ED Course. Radiology: ordered and independent interpretation performed. Decision-making details documented in ED Course. ECG/medicine tests: ordered and independent interpretation performed. Decision-making details documented in ED Course.  Risk Prescription drug management.   This patient presents to the ED for concern of SOB, this involves an extensive number of treatment options, and is a complaint that carries with it a high risk of complications and morbidity.  The differential diagnosis includes asthma, PE, CHF, PNA, etc  52 year old male presents to the ED due to worsening shortness of breath x 1 week.  Symptoms started with a cough however, cough resolved and shortness of breath worsen.  History of A-fib on Xarelto which he has been compliant with.  Also endorses lower extremity edema, orthopnea, and wheeze.  History of asthma.  Sent by [facility] for further evaluation.  Upon arrival patient afebrile, not tachycardic or hypoxic.  Patient in no acute distress.  Reassuring physical  exam.  Lungs clear to auscultation bilaterally.  Trace edema to bilateral lower extremities around sock line.  Routine labs ordered.  BNP to rule out CHF.  Troponin to rule out ACS.  COVID test to rule out infection.  Chest x-ray to rule out evidence of pneumonia.  Patient given Solu-Medrol and DuoNeb treatment.  Lower suspicion for PE/DVT given patient has been compliant with his Xarelto.  CBC unremarkable.  No leukocytosis.  Normal hemoglobin.  BMP unremarkable.  Normal renal function.  No major electrolyte derangements.  BNP normal.  Low suspicion for undiagnosed congestive heart failure.  COVID-negative.  Troponin normal.  Chest x-ray personally viewed interpreted negative for any acute abnormalities.  No evidence of pneumonia.  8:08 PM Reassessed patient at bedside.  Patient resting comfortably in bed.  Patient admits to some improvement in shortness of breath after DuoNeb treatment.  Given unremarkable labs added D-dimer to rule out PE given unexplained etiology of shortness of breath.  If negative possible asthma exacerbation.  D-dimer normal.  Low suspicion for PE/DVT.  EKG demonstrates normal sinus rhythm.  No signs of acute ischemia.  Low suspicion for atypical ACS.  Unclear etiology of shortness of breath however, possible asthma exacerbation given improvement in symptoms after DuoNeb treatment?  Patient ambulated in the ED and maintain O2 saturation above 95%.  No signs of respiratory distress.  Patient stable for discharge.  Patient discharged with prednisone, albuterol, and Tessalon Perles.  Advised patient follow-up with PCP within 2 to 3 days for recheck or return sooner if symptoms worsen. Strict ED precautions discussed with patient. Patient states understanding and agrees to plan. Patient discharged home in no acute distress and stable vitals  Has PCP Hx asthma, A. Fib       Final Clinical Impression(s) / ED Diagnoses Final diagnoses:  SOB (shortness of breath)    Rx / DC  Orders ED Discharge Orders          Ordered    predniSONE (DELTASONE) 20 MG tablet  Daily        07/09/22 2214    benzonatate (TESSALON) 100 MG capsule  Every 8 hours        07/09/22 2214    albuterol (VENTOLIN HFA) 108 (90 Base) MCG/ACT inhaler  Every 6 hours PRN        07/09/22 2214              Jesusita Oka 07/09/22 2217    Charlynne Pander, MD 07/09/22 [phone]

## 2022-07-09 NOTE — ED Notes (Signed)
Pt ambulated from ed with significant other. Pt family to drive home. Pt verbalized understanding of discharge instructions.

## 2022-07-09 NOTE — ED Notes (Signed)
Ambulated pt, pt stated they felt fatigue, weak, and SHOB. Pts SpO2 stay between 96-98%, and HR stayed between 79-93.

## 2022-07-10 ENCOUNTER — Ambulatory Visit: Payer: Federal, State, Local not specified - PPO | Admitting: Family Medicine

## 2022-07-10 VITALS — BP 136/90 | HR 85 | Temp 98.7°F | Wt 278.6 lb

## 2022-07-10 DIAGNOSIS — J302 Other seasonal allergic rhinitis: Secondary | ICD-10-CM | POA: Diagnosis not present

## 2022-07-10 DIAGNOSIS — R0602 Shortness of breath: Secondary | ICD-10-CM | POA: Diagnosis not present

## 2022-07-10 DIAGNOSIS — F411 Generalized anxiety disorder: Secondary | ICD-10-CM | POA: Diagnosis not present

## 2022-07-10 NOTE — Progress Notes (Signed)
Established Patient Office Visit   Subjective  Patient ID: Scott Walter, male    DOB: 1970/07/05  Age: 52 y.o. MRN: [id number]  Chief Complaint  Patient presents with   Medical Management of Chronic Issues    Went to [place] first, xray and EKG. Left side of heart was enlarged. Went to [facility] did labs, EKG, xray and breathing treatment, did not find anything. Still does not feel any better. Went to [place] and [facility]. Feels like he is drowning , trying to hold breath, anxious, restless, feels like he is gasping for air. Nothing eases the feeling. Inhaler does nothing   Pt accompanied by his wife  Patient is a 52 year old male followed by Dr. Caryl Never and seen for ongoing concern.  Patient endorses shortness of breath at rest and with exertion x 1 wk.  Symptoms worse at night, at times waking patient up from sleep.  Using albuterol inhaler more frequently.  [facility] 5/22 for symptoms.  Pt sent to ED as CXR and EKG done at [facility] concerning for enlarged heart.  In ED testing normal including single view CXR, BNP.  Pt has EKG from the [place], no LVH noted.   Pt notes sig h/o anxiety, depression, PTSD.  States has tried numerous meds in the past without improvement in symptoms.  Pt typically avoids leaving the house when possible.  Notes feeling irritated when out.    Past Medical History:  Diagnosis Date   Allergy    Anxiety    Arthritis    Asthma    Bipolar disorder (HCC)    h/o SI   Chronic insomnia    Chronic lower back pain    Depression    Dyslipidemia    Dyspnea    Dysrhythmia    Essential hypertension    GERD (gastroesophageal reflux disease)    Headache(784.0)    "monthly" (01/26/2013)   History of stomach ulcers ~ 2009   Insomnia    Midsternal chest pain    a. 06/2014 Myoview: EF 50%, no ischemia/infarct.   Migraine    "maybe 2 in my lifetime" (01/26/2013)   PAF (paroxysmal atrial fibrillation) (HCC)    a. Dx 05/2012;  b. 06/2014 Xarelto started in  setting of ? TIA (CHA2DS2VASc = 3);  c. 06/2014 Echo: EF 60-65%, no rwma.   PTSD (post-traumatic stress disorder)    Sleep apnea    Stroke [facility])    Past Surgical History:  Procedure Laterality Date   EAR CANALOPLASTY Left    KNEE ARTHROSCOPY Right 09/2011   KNEE SURGERY  02/2017   right knee   PATELLA-FEMORAL ARTHROPLASTY Right 10/17/2021   Procedure: PATELLOFEMORAL ARTHROPLASTY;  Surgeon: Durene Romans, MD;  Location: [facility];  Service: Orthopedics;  Laterality: Right;   SHOULDER SURGERY Left    VASECTOMY  09/2017   Social History   Tobacco Use   Smoking status: Former    Packs/day: 0.75    Years: 30.00    Additional pack years: 0.00    Total pack years: 22.50    Types: Cigarettes    Quit date: 03/07/2017    Years since quitting: 5.3   Smokeless tobacco: Former    Types: Chew   Tobacco comments:    01/26/2013 "quit chewing > 20 yr ago"  Vaping Use   Vaping Use: Every day   Substances: Nicotine  Substance Use Topics   Alcohol use: Yes    Comment: occ   Drug use:  No   Family History  Problem Relation Age of Onset   CAD Mother    Diabetes Mother    Emphysema Father    Healthy Sister    Healthy Sister    Healthy Sister    Healthy Sister    Healthy Sister    Healthy Sister    Healthy Sister    Healthy Sister    Healthy Brother    Healthy Brother    Healthy Brother    Healthy Brother    HIV Brother    Colon cancer Maternal Grandfather    Esophageal cancer Neg Hx    Stomach cancer Neg Hx    Pancreatic cancer Neg Hx    Allergies  Allergen Reactions   Ambien [Zolpidem Tartrate] Other (See Comments)    "Nightmares and mood swings. Hallucinations    Divalproex Sodium Anaphylaxis, Shortness Of Breath and Swelling   Valproic Acid Anaphylaxis and Swelling   Zolpidem Anaphylaxis and Other (See Comments)   Aspirin Hives      ROS Negative unless stated above    Objective:     BP (!) 136/90 (BP Location: Right Arm, Patient Position: Sitting, Cuff Size:  Large)   Pulse 85   Temp 98.7 F (37.1 C) (Oral)   Wt 278 lb 9.6 oz (126.4 kg)   SpO2 96%   BMI 39.97 kg/m    Physical Exam Constitutional:      General: He is not in acute distress.    Appearance: Normal appearance.  HENT:     Head: Normocephalic and atraumatic.     Comments: B/l TMs full.    Nose: Nose normal.     Mouth/Throat:     Mouth: Mucous membranes are moist.  Cardiovascular:     Rate and Rhythm: Normal rate and regular rhythm.     Heart sounds: Normal heart sounds. No murmur heard.    No gallop.  Pulmonary:     Effort: Pulmonary effort is normal. No respiratory distress.     Breath sounds: Normal breath sounds. No wheezing, rhonchi or rales.  Skin:    General: Skin is warm and dry.  Neurological:     Mental Status: He is alert and oriented to person, place, and time.  Psychiatric:        Attention and Perception: Attention normal.        Mood and Affect: Mood is anxious.        Speech: Speech normal.        Behavior: Behavior normal. Behavior is cooperative.       07/10/2022    2:17 PM 09/10/2021   10:51 AM 09/06/2014    1:21 PM  Depression screen PHQ 2/9  Decreased Interest 3 3 0  Down, Depressed, Hopeless 3 0 0  PHQ - 2 Score 6 3 0  Altered sleeping 3 3   Tired, decreased energy 3 3   Change in appetite 1 3   Feeling bad or failure about yourself  3 1   Trouble concentrating 3 3   Moving slowly or fidgety/restless 1 0   Suicidal thoughts 0 0   PHQ-9 Score 20 16   Difficult doing work/chores Extremely dIfficult Extremely dIfficult        07/10/2022    2:17 PM  GAD 7 : Generalized Anxiety Score  Nervous, Anxious, on Edge 3  Control/stop worrying 3  Worry too much - different things 3  Trouble relaxing 3  Restless 3  Easily annoyed or irritable 3  Afraid -  awful might happen 3  Total GAD 7 Score 21  Anxiety Difficulty Extremely difficult    No results found for any visits on 07/10/22.    Assessment & Plan:  GAD (generalized anxiety  disorder)  SOB (shortness of breath)  Seasonal allergies  [facility] and ED notes, labs, imaging, EKG, etc reviewed and discussed with pt.   EKG from [place] [facility].  No LVH by criteria (nml R and S wave height).  Advised symptoms likely due to increased/depression.  Also advised seasonal allergies likely also contributing.  Patient encouraged to set up follow-up appointment with psychiatrist at [facility] to discuss treatment plan.  Also advised to try OTC antihistamine such as Zyrtec, etc. given strict precautions.  Return if symptoms worsen or fail to improve.   Deeann Saint, MD

## 2022-07-16 NOTE — Progress Notes (Signed)
Cardiology Office Note:    Date:  07/22/2022   ID:  Leane Call, DOB Apr 01, 1970, MRN [id number]  PCP:  Kristian Covey, MD  [facility] Providers Cardiologist:  Dietrich Pates, MD     Referring MD: Kristian Covey, MD   Chief Complaint:  No chief complaint on file.     History of Present Illness:   Scott Walter is a 52 y.o. male with  history of HTN, HLD,PAF, TIA, OSA on CPAP     Patient last saw Dr. Tenny Craw 07/2021 and stable.  Patient went to ED 07/09/22 with SOB. BNP, troponins, labs all stable. Sent home with prednisone, albuterol and tessalon perles.  Patient feels SOB at rest for over a month, worse with exertion. Meds above didn't help. Had brief palpitations yest and Friday but not concerning. He doesn't think it was Afib. Has been off CPAP for the past few weeks because he feels like he's smothering. No edema. He initially went to the [place] and had a CXR and told his heart was enlarged. CXR in ED 5/22 NAD, Was working out at gym since January until this started. No long distance travel. Unemployed at the time. Has occassional wheezing and has a history of asthma.      Past Medical History:  Diagnosis Date   Allergy    Anxiety    Arthritis    Asthma    Bipolar disorder (HCC)    h/o SI   Chronic insomnia    Chronic lower back pain    Depression    Dyslipidemia    Dyspnea    Dysrhythmia    Essential hypertension    GERD (gastroesophageal reflux disease)    Headache(784.0)    "monthly" (01/26/2013)   History of stomach ulcers ~ 2009   Insomnia    Midsternal chest pain    a. 06/2014 Myoview: EF 50%, no ischemia/infarct.   Migraine    "maybe 2 in my lifetime" (01/26/2013)   PAF (paroxysmal atrial fibrillation) (HCC)    a. Dx 05/2012;  b. 06/2014 Xarelto started in setting of ? TIA (CHA2DS2VASc = 3);  c. 06/2014 Echo: EF 60-65%, no rwma.   PTSD (post-traumatic stress disorder)    Sleep apnea    Stroke [facility])    Current Medications: Current Meds  Medication Sig    acetaminophen (TYLENOL) 500 MG tablet Take 2 tablets (1,000 mg total) by mouth every 6 (six) hours.   albuterol (PROVENTIL HFA;VENTOLIN HFA) 108 (90 Base) MCG/ACT inhaler Inhale 2 puffs into the lungs every 6 (six) hours as needed for wheezing or shortness of breath. (Patient taking differently: Inhale 2 puffs into the lungs 2 (two) times daily as needed for wheezing or shortness of breath.)   Artificial Tear Ointment (DRY EYES OP) Apply 1 Application to eye daily in the afternoon.   atenolol (TENORMIN) 25 MG tablet Take 1 tablet (25 mg total) by mouth daily.   benzonatate (TESSALON) 100 MG capsule Take 1 capsule (100 mg total) by mouth every 8 (eight) hours.   Biotin 40981 MCG TABS Take 10,000 Units by mouth daily.   Calcium Carbonate Antacid (TUMS CHEWY BITES PO) Take 2 tablets by mouth as needed (heartburn).   cetirizine (ZYRTEC) 10 MG tablet Take 10 mg by mouth daily.   docusate sodium (COLACE) 100 MG capsule Take 1 capsule (100 mg total) by mouth 2 (two) times daily.   flecainide (TAMBOCOR) 50 MG tablet TAKE 1.5 TABLETS BY MOUTH 2 TIMES DAILY. (Patient taking differently: Take  50-100 mg by mouth See admin instructions. Take 50 mg by mouth in the morning and 100 mg at bedtime)   fluticasone (FLONASE) 50 MCG/ACT nasal spray Place 1 spray into both nostrils daily.   Melatonin 10 MG CAPS Take 20-30 mg by mouth at bedtime.   meloxicam (MOBIC) 15 MG tablet Take 15 mg by mouth daily as needed for pain.   methocarbamol (ROBAXIN) 500 MG tablet Take 1 tablet (500 mg total) by mouth every 6 (six) hours as needed for muscle spasms.   Multiple Vitamin (MULTI VITAMIN MENS PO) Take by mouth.   omeprazole (PRILOSEC) 40 MG capsule Take 40 mg by mouth daily.   ondansetron (ZOFRAN-ODT) 4 MG disintegrating tablet Take 4 mg by mouth as needed for nausea or vomiting.   oxyCODONE (OXY IR/ROXICODONE) 5 MG immediate release tablet Take 1-2 tablets (5-10 mg total) by mouth every 4 (four) hours as needed for severe  pain.   polyethylene glycol (MIRALAX / GLYCOLAX) 17 g packet Take 17 g by mouth daily as needed for mild constipation.   risperidone (RISPERDAL) 4 MG tablet Take 2 mg by mouth daily. At bed time   Tetrahydrozoline HCl (VISINE OP) Place 1 drop into both eyes as needed (redness).   triamcinolone cream (KENALOG) 0.1 % Apply 1 Application topically as needed (exzema).   Vitamin D-Vitamin K (VITAMIN K2-VITAMIN D3 PO) Take 1 tablet by mouth daily.   XARELTO 20 MG TABS tablet Take 1 tablet (20 mg total) by mouth daily with supper.    Allergies:   Ambien [zolpidem tartrate], Divalproex sodium, Valproic acid, Zolpidem, and Aspirin   Social History   Tobacco Use   Smoking status: Former    Packs/day: 0.75    Years: 30.00    Additional pack years: 0.00    Total pack years: 22.50    Types: Cigarettes    Quit date: 03/07/2017    Years since quitting: 5.3   Smokeless tobacco: Former    Types: Chew   Tobacco comments:    01/26/2013 "quit chewing > 20 yr ago"  Vaping Use   Vaping Use: Every day   Substances: Nicotine  Substance Use Topics   Alcohol use: Yes    Comment: occ   Drug use: No    Family Hx: The patient's family history includes CAD in his mother; Colon cancer in his maternal grandfather; Diabetes in his mother; Emphysema in his father; HIV in his brother; Healthy in his brother, brother, brother, brother, sister, sister, sister, sister, sister, sister, sister, and sister. There is no history of Esophageal cancer, Stomach cancer, or Pancreatic cancer.  ROS     Physical Exam:    VS:  BP 120/82   Pulse 76   Ht 5\' 10"  (1.778 m)   Wt 284 lb (128.8 kg)   SpO2 97%   BMI 40.75 kg/m     Wt Readings from Last 3 Encounters:  07/22/22 284 lb (128.8 kg)  07/10/22 278 lb 9.6 oz (126.4 kg)  07/09/22 257 lb 15 oz (117 kg)    Physical Exam  GEN: Obese, in no acute distress  Neck: no JVD, carotid bruits, or masses Cardiac:RRR; no murmurs, rubs, or gallops  Respiratory:  clear to  auscultation bilaterally, normal work of breathing GI: soft, nontender, nondistended, + BS Ext: without cyanosis, clubbing, or edema, Good distal pulses bilaterally Neuro:  Alert and Oriented x 3,  Psych: euthymic mood, full affect        EKGs/Labs/Other Test Reviewed:  EKG:  EKG is not ordered today.     Recent Labs: 08/13/2021: TSH 1.240 07/09/2022: B Natriuretic Peptide 10.4; BUN 15; Creatinine, Ser 0.81; Hemoglobin 15.2; Platelets 180; Potassium 4.0; Sodium 137   Recent Lipid Panel Recent Labs    08/13/21 1031  CHOL 206*  TRIG 105  HDL 54  LDLCALC 133*     Prior CV Studies:       Risk Assessment/Calculations/Metrics:    CHA2DS2-VASc Score = 1   This indicates a 0.6% annual risk of stroke. The patient's score is based upon: CHF History: 0 HTN History: 1 Diabetes History: 0 Stroke History: 0 Vascular Disease History: 0 Age Score: 0 Gender Score: 0     STOP-Bang Score:  7           ASSESSMENT & PLAN:   No problem-specific Assessment & Plan notes found for this encounter.   Dyspnea at rest and exertion for over a month. Was on steroids, inhalers, has asthma, CPAP not working-managed by the [place]. No evidence of fluid overload on exam. O2 sat 97%, D dimer and labs normal in the ED. Will order CT chest, refer to pulmonary, [facility] and [facility].   PAF on flecainide and beta blocker CHADSVASC-1. Only occassional brief palpitations.   HTN BP controlled  OSA on CPAP-not working and [facility]-he will f/u.  HLD-LDL 133 last year not on meds            Dispo:  No follow-ups on file.   Medication Adjustments/Labs and Tests Ordered: Current medicines are reviewed at length with the patient today.  Concerns regarding medicines are outlined above.  Tests Ordered: Orders Placed This Encounter  Procedures   CT Chest Wo Contrast   Ambulatory referral to Pulmonology   ECHOCARDIOGRAM COMPLETE   Medication Changes: No orders of  the defined types were placed in this encounter.  Signed, Jacolyn Reedy, PA-C  07/22/2022 1:46 PM    [facility] [address] [place], [place], [place]  [address] Phone: [phone]; Fax: [phone]

## 2022-07-22 ENCOUNTER — Encounter: Payer: Self-pay | Admitting: Physician Assistant

## 2022-07-22 ENCOUNTER — Ambulatory Visit: Payer: Federal, State, Local not specified - PPO | Attending: Physician Assistant | Admitting: Physician Assistant

## 2022-07-22 VITALS — BP 120/82 | HR 76 | Ht 70.0 in | Wt 284.0 lb

## 2022-07-22 DIAGNOSIS — E785 Hyperlipidemia, unspecified: Secondary | ICD-10-CM | POA: Diagnosis not present

## 2022-07-22 DIAGNOSIS — I10 Essential (primary) hypertension: Secondary | ICD-10-CM

## 2022-07-22 DIAGNOSIS — I480 Paroxysmal atrial fibrillation: Secondary | ICD-10-CM

## 2022-07-22 DIAGNOSIS — R0602 Shortness of breath: Secondary | ICD-10-CM

## 2022-07-22 DIAGNOSIS — G4733 Obstructive sleep apnea (adult) (pediatric): Secondary | ICD-10-CM

## 2022-07-22 DIAGNOSIS — G459 Transient cerebral ischemic attack, unspecified: Secondary | ICD-10-CM

## 2022-07-22 NOTE — Patient Instructions (Signed)
Medication Instructions:  Your physician recommends that you continue on your current medications as directed. Please refer to the Current Medication list given to you today.  *If you need a refill on your cardiac medications before your next appointment, please call your pharmacy*   Lab Work: None ordered.  If you have labs (blood work) drawn today and your tests are completely normal, you will receive your results only by: MyChart Message (if you have MyChart) OR A paper copy in the mail If you have any lab test that is abnormal or we need to change your treatment, we will call you to review the results.   Testing/Procedures: Non-Cardiac CT scanning, (CAT scanning), is a noninvasive, special x-ray that produces cross-sectional images of the body using x-rays and a computer. CT scans help physicians diagnose and treat medical conditions. For some CT exams, a contrast material is used to enhance visibility in the area of the body being studied. CT scans provide greater clarity and reveal more details than regular x-ray exams.   Your physician has requested that you have an echocardiogram. Echocardiography is a painless test that uses sound waves to create images of your heart. It provides your doctor with information about the size and shape of your heart and how well your heart's chambers and valves are working. This procedure takes approximately one hour. There are no restrictions for this procedure. Please do NOT wear cologne, perfume, aftershave, or lotions (deodorant is allowed). Please arrive 15 minutes prior to your appointment time.    Follow-Up: At [facility], you and your health needs are our priority.  As part of our continuing mission to provide you with exceptional heart care, we have created designated Provider Care Teams.  These Care Teams include your primary Cardiologist (physician) and Advanced Practice Providers (APPs -  Physician Assistants and Nurse  Practitioners) who all work together to provide you with the care you need, when you need it.  We recommend signing up for the patient portal called "MyChart".  Sign up information is provided on this After Visit Summary.  MyChart is used to connect with patients for Virtual Visits (Telemedicine).  Patients are able to view lab/test results, encounter notes, upcoming appointments, etc.  Non-urgent messages can be sent to your provider as well.   To learn more about what you can do with MyChart, go to [email or web address].    Your next appointment:   First available appointment with Dr Tenny Craw  Other Instructions Referral placed to Pulmonary

## 2022-07-25 DIAGNOSIS — E785 Hyperlipidemia, unspecified: Secondary | ICD-10-CM | POA: Diagnosis not present

## 2022-07-25 DIAGNOSIS — Z0189 Encounter for other specified special examinations: Secondary | ICD-10-CM | POA: Diagnosis not present

## 2022-07-25 DIAGNOSIS — R0602 Shortness of breath: Secondary | ICD-10-CM | POA: Diagnosis not present

## 2022-07-25 DIAGNOSIS — I4891 Unspecified atrial fibrillation: Secondary | ICD-10-CM | POA: Diagnosis not present

## 2022-07-25 DIAGNOSIS — F4312 Post-traumatic stress disorder, chronic: Secondary | ICD-10-CM | POA: Diagnosis not present

## 2022-07-25 DIAGNOSIS — F1010 Alcohol abuse, uncomplicated: Secondary | ICD-10-CM | POA: Diagnosis not present

## 2022-07-25 DIAGNOSIS — I10 Essential (primary) hypertension: Secondary | ICD-10-CM | POA: Diagnosis not present

## 2022-07-25 DIAGNOSIS — F17291 Nicotine dependence, other tobacco product, in remission: Secondary | ICD-10-CM | POA: Diagnosis not present

## 2022-07-25 DIAGNOSIS — F319 Bipolar disorder, unspecified: Secondary | ICD-10-CM | POA: Diagnosis not present

## 2022-07-28 ENCOUNTER — Encounter: Payer: Self-pay | Admitting: Family Medicine

## 2022-07-28 DIAGNOSIS — F17291 Nicotine dependence, other tobacco product, in remission: Secondary | ICD-10-CM | POA: Diagnosis not present

## 2022-07-28 DIAGNOSIS — Z8673 Personal history of transient ischemic attack (TIA), and cerebral infarction without residual deficits: Secondary | ICD-10-CM | POA: Diagnosis not present

## 2022-07-28 DIAGNOSIS — F4312 Post-traumatic stress disorder, chronic: Secondary | ICD-10-CM | POA: Diagnosis not present

## 2022-07-28 DIAGNOSIS — G4733 Obstructive sleep apnea (adult) (pediatric): Secondary | ICD-10-CM | POA: Diagnosis not present

## 2022-08-11 ENCOUNTER — Ambulatory Visit (HOSPITAL_COMMUNITY)
Admission: RE | Admit: 2022-08-11 | Discharge: 2022-08-11 | Disposition: A | Discharge status: Home / Self Care | Payer: Federal, State, Local not specified - PPO | Source: Ambulatory Visit | Attending: Physician Assistant | Admitting: Physician Assistant

## 2022-08-11 DIAGNOSIS — R0602 Shortness of breath: Secondary | ICD-10-CM | POA: Insufficient documentation

## 2022-08-11 DIAGNOSIS — I2510 Atherosclerotic heart disease of native coronary artery without angina pectoris: Secondary | ICD-10-CM | POA: Diagnosis not present

## 2022-08-11 DIAGNOSIS — I517 Cardiomegaly: Secondary | ICD-10-CM | POA: Diagnosis not present

## 2022-08-20 ENCOUNTER — Ambulatory Visit (HOSPITAL_COMMUNITY): Payer: Federal, State, Local not specified - PPO | Attending: Internal Medicine

## 2022-08-20 DIAGNOSIS — R0602 Shortness of breath: Secondary | ICD-10-CM

## 2022-08-20 LAB — ECHOCARDIOGRAM COMPLETE
Area-P 1/2: 3.46 cm2
S' Lateral: 3.8 cm

## 2022-08-22 ENCOUNTER — Other Ambulatory Visit: Payer: Self-pay | Admitting: Internal Medicine

## 2022-08-22 NOTE — Telephone Encounter (Signed)
Prescription refill request for Xarelto received.  Indication:afib Last office visit:6/24 Weight:128.8 kg Age:52 Scr:0.81 5/24 CrCl:196.56  ml/min  Prescription refilled

## 2022-09-11 DIAGNOSIS — E785 Hyperlipidemia, unspecified: Secondary | ICD-10-CM

## 2022-09-17 DIAGNOSIS — F17291 Nicotine dependence, other tobacco product, in remission: Secondary | ICD-10-CM | POA: Diagnosis not present

## 2022-09-17 DIAGNOSIS — F319 Bipolar disorder, unspecified: Secondary | ICD-10-CM | POA: Diagnosis not present

## 2022-09-17 DIAGNOSIS — F1090 Alcohol use, unspecified, uncomplicated: Secondary | ICD-10-CM | POA: Diagnosis not present

## 2022-09-17 DIAGNOSIS — F4312 Post-traumatic stress disorder, chronic: Secondary | ICD-10-CM | POA: Diagnosis not present

## 2022-09-26 ENCOUNTER — Encounter: Payer: Self-pay | Admitting: Physician Assistant

## 2022-09-26 DIAGNOSIS — I10 Essential (primary) hypertension: Secondary | ICD-10-CM | POA: Diagnosis not present

## 2022-09-26 DIAGNOSIS — E785 Hyperlipidemia, unspecified: Secondary | ICD-10-CM | POA: Diagnosis not present

## 2022-09-26 DIAGNOSIS — F17210 Nicotine dependence, cigarettes, uncomplicated: Secondary | ICD-10-CM | POA: Diagnosis not present

## 2022-09-26 DIAGNOSIS — I4891 Unspecified atrial fibrillation: Secondary | ICD-10-CM | POA: Diagnosis not present

## 2022-10-15 DIAGNOSIS — J4540 Moderate persistent asthma, uncomplicated: Secondary | ICD-10-CM | POA: Diagnosis not present

## 2022-10-15 DIAGNOSIS — F17210 Nicotine dependence, cigarettes, uncomplicated: Secondary | ICD-10-CM | POA: Diagnosis not present

## 2022-10-15 LAB — PULMONARY FUNCTION TEST

## 2022-10-18 ENCOUNTER — Other Ambulatory Visit: Payer: Self-pay | Admitting: Internal Medicine

## 2022-10-27 DIAGNOSIS — H527 Unspecified disorder of refraction: Secondary | ICD-10-CM | POA: Diagnosis not present

## 2022-10-27 DIAGNOSIS — H524 Presbyopia: Secondary | ICD-10-CM | POA: Diagnosis not present

## 2022-10-27 DIAGNOSIS — H539 Unspecified visual disturbance: Secondary | ICD-10-CM | POA: Diagnosis not present

## 2022-10-27 DIAGNOSIS — H43813 Vitreous degeneration, bilateral: Secondary | ICD-10-CM | POA: Diagnosis not present

## 2022-11-07 DIAGNOSIS — H524 Presbyopia: Secondary | ICD-10-CM | POA: Diagnosis not present

## 2022-11-18 DIAGNOSIS — G5603 Carpal tunnel syndrome, bilateral upper limbs: Secondary | ICD-10-CM | POA: Diagnosis not present

## 2022-11-18 DIAGNOSIS — M47812 Spondylosis without myelopathy or radiculopathy, cervical region: Secondary | ICD-10-CM | POA: Diagnosis not present

## 2022-11-18 DIAGNOSIS — M4722 Other spondylosis with radiculopathy, cervical region: Secondary | ICD-10-CM | POA: Diagnosis not present

## 2022-11-18 DIAGNOSIS — R202 Paresthesia of skin: Secondary | ICD-10-CM | POA: Diagnosis not present

## 2022-11-18 DIAGNOSIS — K5900 Constipation, unspecified: Secondary | ICD-10-CM | POA: Diagnosis not present

## 2022-11-18 DIAGNOSIS — R200 Anesthesia of skin: Secondary | ICD-10-CM | POA: Diagnosis not present

## 2022-11-18 DIAGNOSIS — M4802 Spinal stenosis, cervical region: Secondary | ICD-10-CM | POA: Diagnosis not present

## 2022-11-26 NOTE — Progress Notes (Signed)
Cardiology Office Note   Date:  11/27/2022   ID:  Scott Walter, DOB 10/03/1970, MRN [id number]  PCP:  Scott Covey, MD  Cardiologist:   Dietrich Pates, MD    Follow-up of PAF and hypertension   History of Present Illness: Scott Walter is a 52 y.o. male with a history of PAF, HTN, ? TIA, bipolar disorder   The pt has had  DOE in past   IN 2020Lexiscan myovue was normal    Event monitor also done showing no afib      The pt has a hx of insomnia and also severe sleep apnea.  Cannot tolerate CPAP   Followed now at [place]    I last saw the pt in June 2023   He was seen by Leda Gauze in the interval  Since seen he denies palpitations   He denies CP    No dizziness  Does get SOB with activity     Notes some wheezing   Current Meds  Medication Sig   acetaminophen (TYLENOL) 500 MG tablet Take 2 tablets (1,000 mg total) by mouth every 6 (six) hours.   albuterol (PROVENTIL HFA;VENTOLIN HFA) 108 (90 Base) MCG/ACT inhaler Inhale 2 puffs into the lungs every 6 (six) hours as needed for wheezing or shortness of breath. (Patient taking differently: Inhale 2 puffs into the lungs 2 (two) times daily as needed for wheezing or shortness of breath.)   albuterol (VENTOLIN HFA) 108 (90 Base) MCG/ACT inhaler Inhale 1-2 puffs into the lungs every 6 (six) hours as needed for wheezing or shortness of breath.   Artificial Tear Ointment (DRY EYES OP) Apply 1 Application to eye daily in the afternoon.   atenolol (TENORMIN) 25 MG tablet TAKE 1 TABLET (25 MG TOTAL) BY MOUTH DAILY.   benzonatate (TESSALON) 100 MG capsule Take 1 capsule (100 mg total) by mouth every 8 (eight) hours.   Biotin 40981 MCG TABS Take 10,000 Units by mouth daily.   Calcium Carbonate Antacid (TUMS CHEWY BITES PO) Take 2 tablets by mouth as needed (heartburn).   cetirizine (ZYRTEC) 10 MG tablet Take 10 mg by mouth daily.   docusate sodium (COLACE) 100 MG capsule Take 1 capsule (100 mg total) by mouth 2 (two) times daily.   flecainide (TAMBOCOR)  50 MG tablet TAKE 1.5 TABLETS BY MOUTH 2 TIMES DAILY.   fluticasone (FLONASE) 50 MCG/ACT nasal spray Place 1 spray into both nostrils daily.   Melatonin 10 MG CAPS Take 20-30 mg by mouth at bedtime.   meloxicam (MOBIC) 15 MG tablet Take 15 mg by mouth daily as needed for pain.   methocarbamol (ROBAXIN) 500 MG tablet Take 1 tablet (500 mg total) by mouth every 6 (six) hours as needed for muscle spasms.   Multiple Vitamin (MULTI VITAMIN MENS PO) Take by mouth.   ondansetron (ZOFRAN-ODT) 4 MG disintegrating tablet Take 4 mg by mouth as needed for nausea or vomiting.   oxyCODONE (OXY IR/ROXICODONE) 5 MG immediate release tablet Take 1-2 tablets (5-10 mg total) by mouth every 4 (four) hours as needed for severe pain.   polyethylene glycol (MIRALAX / GLYCOLAX) 17 g packet Take 17 g by mouth daily as needed for mild constipation.   risperidone (RISPERDAL) 4 MG tablet Take 2 mg by mouth daily. At bed time   Tetrahydrozoline HCl (VISINE OP) Place 1 drop into both eyes as needed (redness).   triamcinolone cream (KENALOG) 0.1 % Apply 1 Application topically as needed (exzema).   Vitamin D-Vitamin K (  VITAMIN K2-VITAMIN D3 PO) Take 1 tablet by mouth daily.   XARELTO 20 MG TABS tablet TAKE 1 TABLET BY MOUTH DAILY WITH SUPPER.     Allergies:   Ambien [zolpidem tartrate], Divalproex sodium, Valproic acid, Zolpidem, and Aspirin   Past Medical History:  Diagnosis Date   Allergy    Anxiety    Arthritis    Asthma    Bipolar disorder (HCC)    h/o SI   Chronic insomnia    Chronic lower back pain    Depression    Dyslipidemia    Dyspnea    Dysrhythmia    Essential hypertension    GERD (gastroesophageal reflux disease)    Headache(784.0)    "monthly" (01/26/2013)   History of stomach ulcers ~ 2009   Insomnia    Midsternal chest pain    a. 06/2014 Myoview: EF 50%, no ischemia/infarct.   Migraine    "maybe 2 in my lifetime" (01/26/2013)   PAF (paroxysmal atrial fibrillation) (HCC)    a. Dx 05/2012;   b. 06/2014 Xarelto started in setting of ? TIA (CHA2DS2VASc = 3);  c. 06/2014 Echo: EF 60-65%, no rwma.   PTSD (post-traumatic stress disorder)    Sleep apnea    Stroke [facility])     Past Surgical History:  Procedure Laterality Date   EAR CANALOPLASTY Left    KNEE ARTHROSCOPY Right 09/2011   KNEE SURGERY  02/2017   right knee   PATELLA-FEMORAL ARTHROPLASTY Right 10/17/2021   Procedure: PATELLOFEMORAL ARTHROPLASTY;  Surgeon: Durene Romans, MD;  Location: [facility];  Service: Orthopedics;  Laterality: Right;   SHOULDER SURGERY Left    VASECTOMY  09/2017     Social History:  The patient  reports that he quit smoking about 5 years ago. His smoking use included cigarettes. He started smoking about 35 years ago. He has a 22.5 pack-year smoking history. He has quit using smokeless tobacco.  His smokeless tobacco use included chew. He reports current alcohol use. He reports that he does not use drugs.   Family History:  The patient's family history includes CAD in his mother; Colon cancer in his maternal grandfather; Diabetes in his mother; Emphysema in his father; HIV in his brother; Healthy in his brother, brother, brother, brother, sister, sister, sister, sister, sister, sister, sister, and sister.    ROS:  Please see the history of present illness. All other systems are reviewed and  Negative to the above problem except as noted.    PHYSICAL EXAM: VS:  BP 118/78   Pulse 70   Ht 5\' 10"  (1.778 m)   Wt 271 lb 6.4 oz (123.1 kg)   SpO2 98%   BMI 38.94 kg/m   GEN:  Obese 52 year-old, in no acute distress  HEENT: normal  Neck: JVP is normal   Cardiac: RRR; no murmur  NO LE edema  Respiratory:  clear to auscultation  SOme decreased airflow   No wheezes   GI: soft, nontender   No hepatomegaly  MS: no deformity Moving all extremities     EKG:  EKG is not ordered     Lipid Panel    Component Value Date/Time   CHOL 206 (H) 08/13/2021 1031   TRIG 105 08/13/2021 1031   HDL 54 08/13/2021 1031    CHOLHDL 3.8 08/13/2021 1031   CHOLHDL 4 07/28/2016 1546   VLDL 37.4 07/28/2016 1546   LDLCALC 133 (H) 08/13/2021 1031      Wt Readings from Last 3 Encounters:  11/27/22  271 lb 6.4 oz (123.1 kg)  07/22/22 284 lb (128.8 kg)  07/10/22 278 lb 9.6 oz (126.4 kg)      ASSESSMENT AND PLAN:  1.  PAF PT denies any signficant spells of tachycardia   keep on b blocker and flecanide   CHADS VASc is 1    2.  Hypertension.  BP is controlled     3  CAD   PT with calcifications of LAD on CT scan    3  Dyspnea  Pt had PFTs done at [facility]   Will get records       4  OSA   Has CPAP but does not use all the time        Current medicines are reviewed at length with the patient today.  The patient does not have concerns regarding medicines.  Signed, Dietrich Pates, MD  11/27/2022 11:08 AM    [facility] [address] [place], [place], [place]  [address] Phone: [phone]; Fax: [phone]

## 2022-11-27 ENCOUNTER — Encounter: Payer: Self-pay | Admitting: Internal Medicine

## 2022-11-27 ENCOUNTER — Ambulatory Visit: Payer: Federal, State, Local not specified - PPO | Attending: Internal Medicine | Admitting: Internal Medicine

## 2022-11-27 VITALS — BP 118/78 | HR 70 | Ht 70.0 in | Wt 271.4 lb

## 2022-11-27 DIAGNOSIS — I480 Paroxysmal atrial fibrillation: Secondary | ICD-10-CM

## 2022-11-27 NOTE — Patient Instructions (Signed)
Medication Instructions:   *If you need a refill on your cardiac medications before your next appointment, please call your pharmacy*   Lab Work: LAB ORDERS GIVEN TO THE PT  If you have labs (blood work) drawn today and your tests are completely normal, you will receive your results only by: MyChart Message (if you have MyChart) OR A paper copy in the mail If you have any lab test that is abnormal or we need to change your treatment, we will call you to review the results.   Testing/Procedures:    Follow-Up: At [facility], you and your health needs are our priority.  As part of our continuing mission to provide you with exceptional heart care, we have created designated Provider Care Teams.  These Care Teams include your primary Cardiologist (physician) and Advanced Practice Providers (APPs -  Physician Assistants and Nurse Practitioners) who all work together to provide you with the care you need, when you need it.  We recommend signing up for the patient portal called "MyChart".  Sign up information is provided on this After Visit Summary.  MyChart is used to connect with patients for Virtual Visits (Telemedicine).  Patients are able to view lab/test results, encounter notes, upcoming appointments, etc.  Non-urgent messages can be sent to your provider as well.   To learn more about what you can do with MyChart, go to [email or web address].    Your next appointment: JANUARY 2025

## 2022-12-18 DIAGNOSIS — R7303 Prediabetes: Secondary | ICD-10-CM | POA: Diagnosis not present

## 2022-12-18 DIAGNOSIS — I10 Essential (primary) hypertension: Secondary | ICD-10-CM | POA: Diagnosis not present

## 2022-12-18 DIAGNOSIS — F17200 Nicotine dependence, unspecified, uncomplicated: Secondary | ICD-10-CM | POA: Diagnosis not present

## 2022-12-18 DIAGNOSIS — E785 Hyperlipidemia, unspecified: Secondary | ICD-10-CM | POA: Diagnosis not present

## 2022-12-24 DIAGNOSIS — F902 Attention-deficit hyperactivity disorder, combined type: Secondary | ICD-10-CM | POA: Diagnosis not present

## 2022-12-25 DIAGNOSIS — R7303 Prediabetes: Secondary | ICD-10-CM | POA: Diagnosis not present

## 2022-12-25 DIAGNOSIS — E785 Hyperlipidemia, unspecified: Secondary | ICD-10-CM | POA: Diagnosis not present

## 2022-12-25 DIAGNOSIS — I10 Essential (primary) hypertension: Secondary | ICD-10-CM | POA: Diagnosis not present

## 2022-12-25 DIAGNOSIS — R935 Abnormal findings on diagnostic imaging of other abdominal regions, including retroperitoneum: Secondary | ICD-10-CM | POA: Diagnosis not present

## 2022-12-25 DIAGNOSIS — K5903 Drug induced constipation: Secondary | ICD-10-CM | POA: Diagnosis not present

## 2022-12-25 DIAGNOSIS — R110 Nausea: Secondary | ICD-10-CM | POA: Diagnosis not present

## 2022-12-25 DIAGNOSIS — R109 Unspecified abdominal pain: Secondary | ICD-10-CM | POA: Diagnosis not present

## 2022-12-30 DIAGNOSIS — E785 Hyperlipidemia, unspecified: Secondary | ICD-10-CM | POA: Diagnosis not present

## 2022-12-30 DIAGNOSIS — R109 Unspecified abdominal pain: Secondary | ICD-10-CM | POA: Diagnosis not present

## 2022-12-30 DIAGNOSIS — R7303 Prediabetes: Secondary | ICD-10-CM | POA: Diagnosis not present

## 2022-12-30 DIAGNOSIS — I10 Essential (primary) hypertension: Secondary | ICD-10-CM | POA: Diagnosis not present

## 2023-01-05 DIAGNOSIS — J45909 Unspecified asthma, uncomplicated: Secondary | ICD-10-CM | POA: Diagnosis not present

## 2023-01-05 DIAGNOSIS — R7303 Prediabetes: Secondary | ICD-10-CM | POA: Diagnosis not present

## 2023-01-05 DIAGNOSIS — I10 Essential (primary) hypertension: Secondary | ICD-10-CM | POA: Diagnosis not present

## 2023-01-05 DIAGNOSIS — E785 Hyperlipidemia, unspecified: Secondary | ICD-10-CM | POA: Diagnosis not present

## 2023-01-19 DIAGNOSIS — H538 Other visual disturbances: Secondary | ICD-10-CM | POA: Diagnosis not present

## 2023-02-21 ENCOUNTER — Other Ambulatory Visit: Payer: Self-pay | Admitting: Internal Medicine

## 2023-02-23 NOTE — Telephone Encounter (Signed)
 Prescription refill request for Xarelto received.  Indication:afib Last office visit:10/24 Weight:123.1  kg Age:53 Scr:0.81  5/24 CrCl:185.75  ml/min  Prescription refilled

## 2023-03-15 NOTE — Progress Notes (Deleted)
Cancelled.

## 2023-03-17 ENCOUNTER — Ambulatory Visit: Payer: Federal, State, Local not specified - PPO | Admitting: Internal Medicine

## 2023-03-26 DIAGNOSIS — F1024 Alcohol dependence with alcohol-induced mood disorder: Secondary | ICD-10-CM | POA: Diagnosis not present

## 2023-03-26 DIAGNOSIS — F319 Bipolar disorder, unspecified: Secondary | ICD-10-CM | POA: Diagnosis not present

## 2023-03-27 DIAGNOSIS — R42 Dizziness and giddiness: Secondary | ICD-10-CM | POA: Diagnosis not present

## 2023-03-27 DIAGNOSIS — F419 Anxiety disorder, unspecified: Secondary | ICD-10-CM | POA: Diagnosis not present

## 2023-03-27 DIAGNOSIS — R5383 Other fatigue: Secondary | ICD-10-CM | POA: Diagnosis not present

## 2023-04-08 DIAGNOSIS — M25561 Pain in right knee: Secondary | ICD-10-CM | POA: Diagnosis not present

## 2023-04-24 DIAGNOSIS — R7303 Prediabetes: Secondary | ICD-10-CM | POA: Diagnosis not present

## 2023-04-24 DIAGNOSIS — F319 Bipolar disorder, unspecified: Secondary | ICD-10-CM | POA: Diagnosis not present

## 2023-04-24 DIAGNOSIS — E785 Hyperlipidemia, unspecified: Secondary | ICD-10-CM | POA: Diagnosis not present

## 2023-04-24 DIAGNOSIS — I10 Essential (primary) hypertension: Secondary | ICD-10-CM | POA: Diagnosis not present

## 2023-04-24 DIAGNOSIS — R413 Other amnesia: Secondary | ICD-10-CM | POA: Diagnosis not present

## 2023-04-30 DIAGNOSIS — M25561 Pain in right knee: Secondary | ICD-10-CM | POA: Diagnosis not present

## 2023-05-04 DIAGNOSIS — K921 Melena: Secondary | ICD-10-CM | POA: Diagnosis not present

## 2023-05-04 DIAGNOSIS — R110 Nausea: Secondary | ICD-10-CM | POA: Diagnosis not present

## 2023-05-04 DIAGNOSIS — Z800 Family history of malignant neoplasm of digestive organs: Secondary | ICD-10-CM | POA: Diagnosis not present

## 2023-05-04 DIAGNOSIS — R1084 Generalized abdominal pain: Secondary | ICD-10-CM | POA: Diagnosis not present

## 2023-05-04 DIAGNOSIS — K219 Gastro-esophageal reflux disease without esophagitis: Secondary | ICD-10-CM | POA: Diagnosis not present

## 2023-05-04 DIAGNOSIS — K862 Cyst of pancreas: Secondary | ICD-10-CM | POA: Diagnosis not present

## 2023-06-10 DIAGNOSIS — E041 Nontoxic single thyroid nodule: Secondary | ICD-10-CM | POA: Diagnosis not present

## 2023-06-30 DIAGNOSIS — Z1289 Encounter for screening for malignant neoplasm of other sites: Secondary | ICD-10-CM | POA: Diagnosis not present

## 2023-07-28 DIAGNOSIS — R7303 Prediabetes: Secondary | ICD-10-CM | POA: Diagnosis not present

## 2023-07-28 DIAGNOSIS — E785 Hyperlipidemia, unspecified: Secondary | ICD-10-CM | POA: Diagnosis not present

## 2023-07-28 DIAGNOSIS — Z0189 Encounter for other specified special examinations: Secondary | ICD-10-CM | POA: Diagnosis not present

## 2023-08-22 ENCOUNTER — Other Ambulatory Visit: Payer: Self-pay | Admitting: Internal Medicine

## 2023-08-24 NOTE — Telephone Encounter (Signed)
 Prescription refill request for Xarelto  received.  Indication: PAF Last office visit: 11/27/22  P Ross Weight: 123.1kg Age: 53 Scr: 0.81 on 07/09/22  Epic CrCl: 185.75 Based on above findings Xarelto  20mg  daily is the appropriate dose.  Pt is past due for labs work and appt with Dr Okey (was due 1/25)  Message sent to schedulers to make appt.  Refill approved x 1 only.

## 2023-09-20 ENCOUNTER — Other Ambulatory Visit: Payer: Self-pay | Admitting: Internal Medicine

## 2023-09-20 DIAGNOSIS — I4891 Unspecified atrial fibrillation: Secondary | ICD-10-CM

## 2023-09-21 NOTE — Telephone Encounter (Signed)
 Prescription refill request for Xarelto  received.  Indication:afib Last office visit:10/24 Weight:123.1  kg Age:53 Drm:wzzid labs CrCl:needs labs  Prescription refilled

## 2023-09-22 ENCOUNTER — Other Ambulatory Visit: Payer: Self-pay | Admitting: Internal Medicine

## 2023-09-24 ENCOUNTER — Encounter: Payer: Self-pay | Admitting: Internal Medicine

## 2023-09-25 MED ORDER — XARELTO 20 MG PO TABS: 20.0000 mg | ORAL_TABLET | Freq: Every day | ORAL | 5 refills | Status: AC

## 2023-09-25 NOTE — Telephone Encounter (Signed)
 [facility] [place] [facility]  Creat 0.81 on 07/28/23, age 53, weight 123.1kg, CrCl 187.26, based on CrCl pt is on appropriate dosage of Xarelto  20mg  every day for afib.  Will refill rx and make pt aware.

## 2023-10-19 ENCOUNTER — Other Ambulatory Visit: Payer: Self-pay | Admitting: Physician Assistant

## 2023-10-23 ENCOUNTER — Other Ambulatory Visit: Payer: Self-pay | Admitting: Internal Medicine

## 2023-11-13 ENCOUNTER — Telehealth: Payer: Self-pay

## 2023-11-13 NOTE — Telephone Encounter (Signed)
   Name: Scott Walter  DOB: [birth year]  MRN: [id number]  Primary Cardiologist: Vina Gull, MD  Chart reviewed as part of pre-operative protocol coverage. Because of Scott Walter past medical history and time since last visit, he will require a follow-up in-office visit in order to better assess preoperative cardiovascular risk.  Pre-op covering staff: - Please schedule appointment and call patient to inform them. If patient already had an upcoming appointment within acceptable timeframe, please add pre-op clearance to the appointment notes so provider is aware. - Please contact requesting surgeon's office via preferred method (i.e, phone, fax) to inform them of need for appointment prior to surgery.  Pharmacy has been notified of need to make recommendations on Xarelto   Lamarr Satterfield, NP  11/13/2023, 4:59 PM

## 2023-11-13 NOTE — Telephone Encounter (Signed)
   Pre-operative Risk Assessment    Patient Name: Scott Walter  DOB: 1971-01-24 MRN: [id number]   Date of last office visit: 11/27/22  Scott Gull, MD Date of next office visit: NA   Request for Surgical Clearance    Procedure:  EGD and Colonoscopy  Date of Surgery:  Clearance 11/25/23                               Surgeon:  Dr. Deward Hanlon or Dr. Lemond arms Surgeon's Group or Practice Name:  [facility] Phone number:  [phone] Fax number:  [phone]   Type of Clearance Requested:   - Medical  - Pharmacy:  Hold Rivaroxaban  (Xarelto ) for two days prior   Type of Anesthesia:  Choice   Additional requests/questions:    Scott Walter   11/13/2023, 4:44 PM

## 2023-11-13 NOTE — Telephone Encounter (Signed)
 Pharmacy please advise on holding Xarelto  prior to EDG/Colonoscopy scheduled for 11/25/2023. Last labs (BMET and CBC) were completed 09/21/2023. Thank you.

## 2023-11-13 NOTE — Telephone Encounter (Signed)
 Unable to leave message for the patient to call our office to schedule IN OFFICE Preop appt mailbox was full.

## 2023-11-16 NOTE — Telephone Encounter (Signed)
 Pt has been scheduled in office appt with Jackee Alberts, NP 11/18/23 for preop clearance.

## 2023-11-16 NOTE — Telephone Encounter (Signed)
 Patient with diagnosis of afib on Xarelto  for anticoagulation.    Procedure: EDG/Colonoscopy  Date of procedure: 11/25/23   CHA2DS2-VASc Score = 4   This indicates a 4.8% annual risk of stroke. The patient's score is based upon: CHF History: 0 HTN History: 1 Diabetes History: 0 Stroke History: 2 Vascular Disease History: 1 Age Score: 0 Gender Score: 0      CrCl >100 ml/min  Patient has not had an Afib/aflutter ablation or Watchman within the last 3 months or DCCV within the last 30 days   Per office protocol, patient can hold Xarelto  for 2 days prior to procedure.    **This guidance is not considered finalized until pre-operative APP has relayed final recommendations.**

## 2023-11-17 NOTE — Progress Notes (Unsigned)
 Cardiology Office Note    Patient Name: Scott Walter Date of Encounter: 11/17/2023  Primary Care Provider:  Micheal Wolm ORN, MD Primary Cardiologist:  Vina Gull, MD Primary Electrophysiologist: None   Past Medical History    Past Medical History:  Diagnosis Date   Allergy    Anxiety    Arthritis    Asthma    Bipolar disorder (HCC)    h/o SI   Chronic insomnia    Chronic lower back pain    Depression    Dyslipidemia    Dyspnea    Dysrhythmia    Essential hypertension    GERD (gastroesophageal reflux disease)    Headache(784.0)    monthly (01/26/2013)   History of stomach ulcers ~ 2009   Insomnia    Midsternal chest pain    a. 06/2014 Myoview : EF 50%, no ischemia/infarct.   Migraine    maybe 2 in my lifetime (01/26/2013)   PAF (paroxysmal atrial fibrillation) (HCC)    a. Dx 05/2012;  b. 06/2014 Xarelto  started in setting of ? TIA (CHA2DS2VASc = 3);  c. 06/2014 Echo: EF 60-65%, no rwma.   PTSD (post-traumatic stress disorder)    Sleep apnea    Stroke [facility])     History of Present Illness  Scott Walter is a 52 y.o. male with a PMH of paroxysmal AF (on Xarelto ), EtOH abuse, OSA (intolerant to CPAP) bipolar disorder HTN, PTSD, HLD, TIA who presents today for preoperative clearance.  Scott Walter was seen initially in April 2014 with complaint of chest pain and found to be in new onset AF with RVR.  He completed a 2D echo that showed EF of 60-65% and was started on Xarelto  and treated with IV Cardizem  with conversion hours later to sinus rhythm.  He was discharged with p.o. flecainide  and seen back in the ED in December with complaint of chest pain that was tender on palpitations with negative troponins.  He completed a outpatient Myoview  that was normal.  He was seen back in the ED on 08/02/2014 with complaint of chest pain and shortness of breath that occurred while at work.  He also reported loss of vision in his left eye and was admitted for workup for TIA with MRI of the brain  as well as CT showing no acute changes.  He completed a nuclear stress test that was low risk additional Lexiscan  Myoview  on 11/2018 that was normal.  He completed a TTE in 08/22/2022 with normal EF and no significant valve abnormalities.  He was last seen by Dr. Gull on 11/27/2022 and reported no significant spells of tachycardia and blood pressure was controlled.  He had report of dyspnea with PFTs completed at the [facility].  Patient denies chest pain, palpitations, dyspnea, PND, orthopnea, nausea, vomiting, dizziness, syncope, edema, weight gain, or early satiety.   Discussed the use of AI scribe software for clinical note transcription with the patient, who gave verbal consent to proceed.  History of Present Illness    ***Notes: Pending EGD and colonoscopy Per office protocol, patient can hold Xarelto  for 2 days prior to procedure.       Review of Systems  Please see the history of present illness.    All other systems reviewed and are otherwise negative except as noted above.  Physical Exam    Wt Readings from Last 3 Encounters:  11/27/22 271 lb 6.4 oz (123.1 kg)  07/22/22 284 lb (128.8 kg)  07/10/22 278 lb 9.6 oz (126.4 kg)   CD:Uyzmz were  no vitals filed for this visit.,There is no height or weight on file to calculate BMI. GEN: Well nourished, well developed in no acute distress Neck: No JVD; No carotid bruits Pulmonary: Clear to auscultation without rales, wheezing or rhonchi  Cardiovascular: Normal rate. Regular rhythm. Normal S1. Normal S2.   Murmurs: There is no murmur.  ABDOMEN: Soft, non-tender, non-distended EXTREMITIES:  No edema; No deformity   EKG/LABS/ Recent Cardiac Studies   ECG personally reviewed by me today - ***  Risk Assessment/Calculations:   {Does this patient have ATRIAL FIBRILLATION?:[phone]}  STOP-Bang Score:     { Consider Dx Sleep Disordered Breathing or Sleep Apnea  ICD G47.33          :1}    Lab Results  Component Value Date   WBC 7.3  07/09/2022   HGB 15.2 07/09/2022   HCT 45.3 07/09/2022   MCV 89.5 07/09/2022   PLT 180 07/09/2022   Lab Results  Component Value Date   CREATININE 0.81 07/09/2022   BUN 15 07/09/2022   NA 137 07/09/2022   K 4.0 07/09/2022   CL 103 07/09/2022   CO2 25 07/09/2022   Lab Results  Component Value Date   CHOL 206 (H) 08/13/2021   HDL 54 08/13/2021   LDLCALC 133 (H) 08/13/2021   TRIG 105 08/13/2021   CHOLHDL 3.8 08/13/2021    Lab Results  Component Value Date   HGBA1C 5.5 08/13/2021   Assessment & Plan    Assessment and Plan Assessment & Plan     1.  Preop clearance: - Patient's RCRI score is 0.9%  2.  Paroxysmal AF  3.  Essential hypertension  4.  Hyperlipidemia  5.  History of TIA      Disposition: Follow-up with Vina Gull, MD or APP in *** months {Are you ordering a CV Procedure (e.g. stress test, cath, DCCV, TEE, etc)?   Press F2        :[id number]}   Signed, Wyn Raddle, Jackee Shove, NP 11/17/2023, 9:40 AM [facility]

## 2023-11-18 ENCOUNTER — Encounter: Payer: Self-pay | Admitting: Nurse Practitioner

## 2023-11-18 ENCOUNTER — Ambulatory Visit: Attending: Internal Medicine | Admitting: Nurse Practitioner

## 2023-11-18 VITALS — BP 138/90 | HR 57 | Ht 70.0 in | Wt 241.2 lb

## 2023-11-18 DIAGNOSIS — I10 Essential (primary) hypertension: Secondary | ICD-10-CM | POA: Diagnosis not present

## 2023-11-18 DIAGNOSIS — E785 Hyperlipidemia, unspecified: Secondary | ICD-10-CM | POA: Diagnosis not present

## 2023-11-18 DIAGNOSIS — I480 Paroxysmal atrial fibrillation: Secondary | ICD-10-CM | POA: Diagnosis not present

## 2023-11-18 DIAGNOSIS — Z01810 Encounter for preprocedural cardiovascular examination: Secondary | ICD-10-CM | POA: Diagnosis not present

## 2023-11-18 DIAGNOSIS — G459 Transient cerebral ischemic attack, unspecified: Secondary | ICD-10-CM

## 2023-11-18 DIAGNOSIS — G4733 Obstructive sleep apnea (adult) (pediatric): Secondary | ICD-10-CM

## 2023-11-18 NOTE — Patient Instructions (Addendum)
 Medication Instructions:  Your physician recommends that you continue on your current medications as directed. Please refer to the Current Medication list given to you today. *If you need a refill on your cardiac medications before your next appointment, please call your pharmacy*  Lab Work: None ordered If you have labs (blood work) drawn today and your tests are completely normal, you will receive your results only by: MyChart Message (if you have MyChart) OR A paper copy in the mail If you have any lab test that is abnormal or we need to change your treatment, we will call you to review the results.  Testing/Procedures: [place] Sleep Study  Follow-Up: At [facility], you and your health needs are our priority.  As part of our continuing mission to provide you with exceptional heart care, our providers are all part of one team.  This team includes your primary Cardiologist (physician) and Advanced Practice Providers or APPs (Physician Assistants and Nurse Practitioners) who all work together to provide you with the care you need, when you need it.  Your next appointment:   6 month(s)  Provider:   Emeline Calender, MD  We recommend signing up for the patient portal called MyChart.  Sign up information is provided on this After Visit Summary.  MyChart is used to connect with patients for Virtual Visits (Telemedicine).  Patients are able to view lab/test results, encounter notes, upcoming appointments, etc.  Non-urgent messages can be sent to your provider as well.   To learn more about what you can do with MyChart, go to [email or web address].   Other Instructions You are cleared for your upcoming procedure.

## 2023-11-19 ENCOUNTER — Telehealth: Payer: Self-pay | Admitting: Internal Medicine

## 2023-11-19 NOTE — Telephone Encounter (Signed)
 Office is requesting Anticoag recommendations after being seen yesterday. Please advise

## 2023-11-23 NOTE — Telephone Encounter (Signed)
 See clearance, patient can hold Xarelto  for 2 days prior to procedure

## 2023-11-23 NOTE — Telephone Encounter (Signed)
 Preoperative risk and Xarelto  hold was addressed in office note from Jackee Alberts, NP from 10/1 office visit. RE-routed the note to requesting office.   Will remove from pre-op pool.   Barnie HERO. Arrow Tomko, DNP, NP-C  11/23/2023, 1:58 PM [facility] [address] Office [phone] Fax [phone]

## 2023-12-28 ENCOUNTER — Encounter: Payer: Self-pay | Admitting: Cardiology

## 2023-12-28 DIAGNOSIS — G4733 Obstructive sleep apnea (adult) (pediatric): Secondary | ICD-10-CM | POA: Diagnosis not present

## 2023-12-29 ENCOUNTER — Ambulatory Visit: Attending: Internal Medicine

## 2023-12-29 DIAGNOSIS — I480 Paroxysmal atrial fibrillation: Secondary | ICD-10-CM

## 2023-12-29 DIAGNOSIS — I10 Essential (primary) hypertension: Secondary | ICD-10-CM

## 2023-12-29 DIAGNOSIS — G459 Transient cerebral ischemic attack, unspecified: Secondary | ICD-10-CM

## 2023-12-29 DIAGNOSIS — Z01810 Encounter for preprocedural cardiovascular examination: Secondary | ICD-10-CM

## 2023-12-29 DIAGNOSIS — E785 Hyperlipidemia, unspecified: Secondary | ICD-10-CM

## 2023-12-29 NOTE — Procedures (Signed)
     SLEEP STUDY REPORT Patient Information Study Date: 12/28/2023 Patient Name: Scott Walter Patient ID: [id number] Birth Date: 11-Feb-1971 Age: 53 Gender: Male BMI: 34.4 (W=240 lb, H=5' 10'') Referring Physician: Jackee Alberts, NP  TEST DESCRIPTION: Home sleep apnea testing was completed using the WatchPat, a Type 1 device, utilizing peripheral arterial tonometry (PAT), chest movement, actigraphy, pulse oximetry, pulse rate, body position and snore. AHI was calculated with apnea and hypopnea using valid sleep time as the denominator. RDI includes apneas, hypopneas, and RERAs. The data acquired and the scoring of sleep and all associated events were performed in accordance with the recommended standards and specifications as outlined in the AASM Manual for the Scoring of Sleep and Associated Events 2.2.0 (2015).  FINDINGS:  1. Mild Obstructive Sleep Apnea with AHI 7.2/hr.  2. No Central Sleep Apnea with pAHIc 0.2/hr.  3. Oxygen desaturations as low as 86%.  4. Mild to moderate snoring was present. O2 sats were <88% for 0.9 min.  5. Total sleep time was 9 hrs and 0 min.  6. 23.6% of total sleep time was spent in REM sleep.  7. Normal sleep onset latency at 18 min.  8. Shortened REM sleep onset latency at 30 min.  9. Total awakenings were 3. 10. Arrhythmia detection: None  DIAGNOSIS: Mild Obstructive Sleep Apnea (G47.33)  RECOMMENDATIONS: 1. Clinical correlation of these findings is necessary. The decision to treat obstructive sleep apnea (OSA) is usually based on the presence of apnea symptoms or the presence of associated medical conditions such as Hypertension, Congestive Heart Failure, Atrial Fibrillation or Obesity. The most common symptoms of OSA are snoring, gasping for breath while sleeping, daytime sleepiness and fatigue. 2. Initiating apnea therapy is recommended given the presence of symptoms and/or associated conditions. Recommend proceeding with one of the  following:  a. Auto-CPAP therapy with a pressure range of 5-20cm H2O.  b. An oral appliance (OA) that can be obtained from certain dentists with expertise in sleep medicine. These are primarily of use in non-obese patients with mild and moderate disease.  c. An ENT consultation which may be useful to look for specific causes of obstruction and possible treatment options.  d. If patient is intolerant to PAP therapy, consider referral to ENT for evaluation for hypoglossal nerve stimulator. 3. Close follow-up is necessary to ensure success with CPAP or oral appliance therapy for maximum benefit . 4. A follow-up oximetry study on CPAP is recommended to assess the adequacy of therapy and determine the need for supplemental oxygen or the potential need for Bi-level therapy. An arterial blood gas to determine the adequacy of baseline ventilation and oxygenation should also be considered. 5. Healthy sleep recommendations include: adequate nightly sleep (normal 7-9 hrs/night), avoidance of caffeine after noon and alcohol near bedtime, and maintaining a sleep environment that is cool, dark and quiet. 6. Weight loss for overweight patients is recommended. Even modest amounts of weight loss can significantly improve the severity of sleep apnea. 7. Snoring recommendations include: weight loss where appropriate, side sleeping, and avoidance of alcohol before bed. 8. Operation of motor vehicle should be avoided when sleepy.   Signature: Wilbert Bihari, MD; [facility]; Diplomat, American Board of Sleep Medicine Electronically Signed: 12/29/2023 8:33:47 PM

## 2023-12-30 ENCOUNTER — Telehealth: Payer: Self-pay | Admitting: *Deleted

## 2023-12-30 NOTE — Telephone Encounter (Signed)
-----   Message from Scott Walter sent at 12/29/2023  8:36 PM EST ----- Patient has mild OSA - he has been intolerant to CPAP in the past.  Could refer for oral device but is $3000 out of pocket that insurance does not cover or could refer to ENT for evaluation.  Please find out what he wants to do

## 2023-12-30 NOTE — Telephone Encounter (Signed)
 The patient has been notified of the result Per dpr lmtcb to let us  know how he would like to proceed.

## 2024-01-28 ENCOUNTER — Other Ambulatory Visit: Payer: Self-pay | Admitting: Internal Medicine

## 2024-01-29 ENCOUNTER — Other Ambulatory Visit: Payer: Self-pay | Admitting: Internal Medicine
